# Patient Record
Sex: Female | Born: 1970
Health system: Southern US, Community
[De-identification: ages and names within clinical notes are randomized; demographics above are authoritative.]

## PROBLEM LIST (undated history)

## (undated) DIAGNOSIS — G629 Polyneuropathy, unspecified: Secondary | ICD-10-CM

## (undated) DIAGNOSIS — M5126 Other intervertebral disc displacement, lumbar region: Secondary | ICD-10-CM

## (undated) DIAGNOSIS — K219 Gastro-esophageal reflux disease without esophagitis: Secondary | ICD-10-CM

## (undated) DIAGNOSIS — O1490 Unspecified pre-eclampsia, unspecified trimester: Secondary | ICD-10-CM

## (undated) DIAGNOSIS — I493 Ventricular premature depolarization: Secondary | ICD-10-CM

## (undated) DIAGNOSIS — I10 Essential (primary) hypertension: Secondary | ICD-10-CM

## (undated) DIAGNOSIS — F419 Anxiety disorder, unspecified: Secondary | ICD-10-CM

## (undated) DIAGNOSIS — J302 Other seasonal allergic rhinitis: Secondary | ICD-10-CM

## (undated) DIAGNOSIS — K589 Irritable bowel syndrome without diarrhea: Secondary | ICD-10-CM

## (undated) DIAGNOSIS — G43909 Migraine, unspecified, not intractable, without status migrainosus: Secondary | ICD-10-CM

## (undated) HISTORY — DX: Migraine, unspecified, not intractable, without status migrainosus: G43.909

## (undated) HISTORY — DX: Gastro-esophageal reflux disease without esophagitis: K21.9

## (undated) HISTORY — DX: Unspecified pre-eclampsia, unspecified trimester: O14.90

## (undated) HISTORY — DX: Irritable bowel syndrome, unspecified: K58.9

## (undated) HISTORY — DX: Other seasonal allergic rhinitis: J30.2

## (undated) HISTORY — DX: Anxiety disorder, unspecified: F41.9

## (undated) HISTORY — PX: NO PAST SURGERIES: SHX2092

## (undated) HISTORY — DX: Essential (primary) hypertension: I10

## (undated) HISTORY — DX: Ventricular premature depolarization: I49.3

## (undated) HISTORY — DX: Other intervertebral disc displacement, lumbar region: M51.26

---

## 2009-11-23 ENCOUNTER — Emergency Department (HOSPITAL_BASED_OUTPATIENT_CLINIC_OR_DEPARTMENT_OTHER): Admission: EM | Admit: 2009-11-23 | Discharge: 2009-11-23 | Payer: Self-pay | Admitting: Emergency Medicine

## 2009-11-23 ENCOUNTER — Ambulatory Visit: Payer: Self-pay | Admitting: Occupational Medicine

## 2009-11-23 ENCOUNTER — Ambulatory Visit: Payer: Self-pay | Admitting: Radiology

## 2009-12-15 ENCOUNTER — Ambulatory Visit: Payer: Self-pay | Admitting: Family Medicine

## 2009-12-15 DIAGNOSIS — I10 Essential (primary) hypertension: Secondary | ICD-10-CM | POA: Insufficient documentation

## 2010-02-18 ENCOUNTER — Ambulatory Visit: Payer: Self-pay | Admitting: Family Medicine

## 2010-02-26 ENCOUNTER — Telehealth (INDEPENDENT_AMBULATORY_CARE_PROVIDER_SITE_OTHER): Payer: Self-pay | Admitting: *Deleted

## 2010-08-31 ENCOUNTER — Ambulatory Visit: Payer: Self-pay | Admitting: Family Medicine

## 2010-09-23 ENCOUNTER — Ambulatory Visit: Payer: Self-pay | Admitting: Family Medicine

## 2010-09-23 ENCOUNTER — Telehealth: Payer: Self-pay | Admitting: Family Medicine

## 2010-09-24 LAB — CONVERTED CEMR LAB
ALT: 10 units/L (ref 0–35)
Amylase: 57 units/L (ref 0–105)
BUN: 12 mg/dL (ref 6–23)
Basophils Absolute: 0 10*3/uL (ref 0.0–0.1)
Basophils Relative: 1 % (ref 0–1)
CO2: 26 meq/L (ref 19–32)
Creatinine, Ser: 0.78 mg/dL (ref 0.40–1.20)
Eosinophils Relative: 4 % (ref 0–5)
HCT: 40.2 % (ref 36.0–46.0)
Hemoglobin: 13.5 g/dL (ref 12.0–15.0)
Lymphocytes Relative: 36 % (ref 12–46)
MCHC: 33.6 g/dL (ref 30.0–36.0)
Monocytes Absolute: 0.4 10*3/uL (ref 0.1–1.0)
Monocytes Relative: 6 % (ref 3–12)
RDW: 12.7 % (ref 11.5–15.5)
Total Bilirubin: 0.4 mg/dL (ref 0.3–1.2)

## 2010-09-27 ENCOUNTER — Encounter: Admission: RE | Admit: 2010-09-27 | Discharge: 2010-09-27 | Payer: Self-pay | Admitting: Family Medicine

## 2010-09-29 ENCOUNTER — Telehealth: Payer: Self-pay | Admitting: Family Medicine

## 2010-10-05 ENCOUNTER — Encounter: Payer: Self-pay | Admitting: Family Medicine

## 2010-11-02 ENCOUNTER — Encounter: Payer: Self-pay | Admitting: Family Medicine

## 2010-11-08 ENCOUNTER — Ambulatory Visit: Payer: Self-pay | Admitting: Family Medicine

## 2010-11-08 LAB — CONVERTED CEMR LAB: Rapid Strep: NEGATIVE

## 2010-11-11 ENCOUNTER — Telehealth: Payer: Self-pay | Admitting: Family Medicine

## 2010-11-12 ENCOUNTER — Ambulatory Visit: Payer: Self-pay | Admitting: Family Medicine

## 2010-11-12 DIAGNOSIS — H8110 Benign paroxysmal vertigo, unspecified ear: Secondary | ICD-10-CM | POA: Insufficient documentation

## 2010-11-12 DIAGNOSIS — H8113 Benign paroxysmal vertigo, bilateral: Secondary | ICD-10-CM | POA: Insufficient documentation

## 2010-11-18 ENCOUNTER — Emergency Department (HOSPITAL_BASED_OUTPATIENT_CLINIC_OR_DEPARTMENT_OTHER)
Admission: EM | Admit: 2010-11-18 | Discharge: 2010-11-18 | Payer: Self-pay | Source: Home / Self Care | Admitting: Emergency Medicine

## 2010-11-30 ENCOUNTER — Ambulatory Visit: Payer: Self-pay | Admitting: Cardiovascular Disease

## 2010-11-30 DIAGNOSIS — R002 Palpitations: Secondary | ICD-10-CM | POA: Insufficient documentation

## 2010-12-02 ENCOUNTER — Ambulatory Visit: Payer: Self-pay | Admitting: Cardiovascular Disease

## 2010-12-09 ENCOUNTER — Ambulatory Visit (HOSPITAL_COMMUNITY)
Admission: RE | Admit: 2010-12-09 | Discharge: 2010-12-09 | Payer: Self-pay | Source: Home / Self Care | Attending: Cardiovascular Disease | Admitting: Cardiovascular Disease

## 2010-12-09 ENCOUNTER — Ambulatory Visit: Payer: Self-pay

## 2010-12-09 ENCOUNTER — Encounter: Payer: Self-pay | Admitting: Cardiovascular Disease

## 2010-12-15 ENCOUNTER — Telehealth: Payer: Self-pay | Admitting: Family Medicine

## 2010-12-22 ENCOUNTER — Encounter: Payer: Self-pay | Admitting: Cardiovascular Disease

## 2010-12-23 ENCOUNTER — Ambulatory Visit
Admission: RE | Admit: 2010-12-23 | Discharge: 2010-12-23 | Payer: Self-pay | Source: Home / Self Care | Attending: Cardiovascular Disease | Admitting: Cardiovascular Disease

## 2011-01-11 NOTE — Assessment & Plan Note (Signed)
Summary: sinusitis   Vital Signs:  Patient profile:   40 year old female Height:      63.5 inches Weight:      135 pounds BMI:     23.62 O2 Sat:      99 % on Room air Temp:     98.8 degrees F oral Pulse rate:   75 / minute BP sitting:   141 / 95  (left arm) Cuff size:   regular  Vitals Entered By: Payton Spark CMA (August 31, 2010 8:32 AM)  O2 Flow:  Room air CC: Sinusitis x 2 days. Fever & congestion.   Primary Care Provider:  Nani Gasser MD  CC:  Sinusitis x 2 days. Fever & congestion.Marland Kitchen  History of Present Illness: Melinda Parsons is a 40 year-old who presents with a one day history of right sided sinus pain, ear pain, headaches and a sore throat.  She reports temperatures around 99 degrees.  She also reports a mild cough and beginning to drain and blow her nose last night. She denies anyone being sick around her.  She also denies any history of allergy symptoms over the past few days, sinus drainage or a sore throat before yesterday.  She denies any dental pain, nausea, vomitting or diarrhea.  She says that she is using Claritin over the counter and that has been benefical.    Current Medications (verified): 1)  Nuvaring 0.12-0.015 Mg/24hr Ring (Etonogestrel-Ethinyl Estradiol) 2)  Claritin-D 24 Hour 10-240 Mg Xr24h-Tab (Loratadine-Pseudoephedrine) .... Take One Tablet  By Mouth Once A Day  Allergies (verified): No Known Drug Allergies  Past History:  Past Medical History: Reviewed history from 12/15/2009 and no changes required. Pre-eclampsia with her daughter.   Social History: Reviewed history from 02/18/2010 and no changes required. 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  Married to [place] with 1 daughter.   Never Smoked Alcohol use-yes- 1-2/month Drug use-no  Review of Systems      See HPI  Physical Exam  General:  alert, well-developed, and well-nourished.   Head:  L maxillary sinus TTP Eyes:  conjunctiva clear Ears:  EACs  patent; TMs translucent and gray with good cone of light and bony landmarks.  Nose:  No nasal discharge.  The turbinates have some ertheyma. Mouth:  good dentition.  Some fair pharyngeal erythema.  Some post nasal drip. Neck:  anterior cervical chain LA bilat Lungs:  Clear to auscultation bilaterally with no crackles, rales, rhonchi or wheezes.  No egophany. Heart:  normal rate, regular rhythm, no murmur, no gallop, and no rub.   Skin:  color normal.  color normal.     Impression & Recommendations:  Problem # 1:  SINUSITIS (ICD-473.9) Possible L maxillary sinusitis vs sinus pressure from viral URI. Will start Zithromax + Advil Cold and Sinus, though warned pt that abx may not be effective if this is all viral. Supportive care measures discussed. Call if not improving in 7 days. The following medications were removed from the medication list:    Claritin-d 24 Hour 10-240 Mg Xr24h-tab (Loratadine-pseudoephedrine) .Marland Kitchen... Take one tablet  by mouth once a day    Augmentin 875-125 Mg Tabs (Amoxicillin-pot clavulanate) .Marland Kitchen... 1 by mouth q12 hrs Her updated medication list for this problem includes:    Zithromax Z-pak 250 Mg Tabs (Azithromycin) .Marland Kitchen... Take as directed x 5 days  Problem # 2:  ELEVATED BLOOD PRESSURE WITHOUT DIAGNOSIS OF HYPERTENSION (ICD-796.2) The patient has been taking Claritin D  to help with  her nasal congestion again.  The patient should return for follow-up and have her blood pressure checked again to ensure that she is not hypertensive.  Today, her blood pressure was 141/95.  Complete Medication List: 1)  Nuvaring 0.12-0.015 Mg/24hr Ring (Etonogestrel-ethinyl estradiol) 2)  Zithromax Z-pak 250 Mg Tabs (Azithromycin) .... Take as directed x 5 days  Patient Instructions: 1)  Take 5 days of Zithromax for possible maxillary sinusitis. 2)  Use OTC Advil Cold and Sinus for symptomatic relief. 3)  Rest, clear fluids. 4)  Call if not starting to improve after 7  days. Prescriptions: ZITHROMAX Z-PAK 250 MG TABS (AZITHROMYCIN) take as directed x 5 days  #1 pack x 0   Entered and Authorized by:   Seymour Bars DO   Signed by:   Seymour Bars DO on 08/31/2010   Method used:   Electronically to        [facility] [phone]* (retail)       [address]       [place], [place]  [address]       Ph: [phone]       Fax: [phone]   RxID:   [id number]

## 2011-01-11 NOTE — Assessment & Plan Note (Signed)
Summary: POSS SINUS INFECTION/TJ   Vital Signs:  Patient Profile:   40 Years Old Female CC:      HA, sore teeth, left ear pressure, jaw pain X 1 day  Height:     63.5 inches Weight:      133 pounds O2 Sat:      99 % O2 treatment:    Room Air Temp:     99.5 degrees F oral Pulse rate:   106 / minute Pulse rhythm:   regular Resp:     14 per minute BP sitting:   135 / 98  (right arm)  Pt. in pain?   yes    Location:   head    Intensity:   4    Type:       aching  Vitals Entered By: Lajean Saver RN (February 18, 2010 8:29 AM)                   Updated Prior Medication List: NUVARING 0.12-0.015 MG/24HR RING (ETONOGESTREL-ETHINYL ESTRADIOL)  CLARITIN-D 24 HOUR 10-240 MG XR24H-TAB (LORATADINE-PSEUDOEPHEDRINE) Take one tablet  by mouth once a day  Current Allergies (reviewed today): No known allergies History of Present Illness Chief Complaint: HA, sore teeth, left ear pressure, jaw pain X 1 day  History of Present Illness: ONSET YESTERDAY WITH SINUS PRESSURE AND CONGESTION. POSSIBLE FEVER. TAKING CLARITIN D. NO COUGH SLIGHT SORE THROAT. HX OF SINUS PROBLEMS. UNDER STRESS AT WORK.   REVIEW OF SYSTEMS Constitutional Symptoms      Denies fever, chills, night sweats, weight loss, weight gain, and fatigue.  Eyes       Denies change in vision, eye pain, eye discharge, glasses, contact lenses, and eye surgery. Ear/Nose/Throat/Mouth       Complains of ear pain and sinus problems.      Denies hearing loss/aids, change in hearing, ear discharge, dizziness, frequent runny nose, frequent nose bleeds, sore throat, hoarseness, and tooth pain or bleeding.      Comments: pressure, jaw pain Respiratory       Denies dry cough, productive cough, wheezing, shortness of breath, asthma, bronchitis, and emphysema/COPD.  Cardiovascular       Denies murmurs, chest pain, and tires easily with exhertion.    Gastrointestinal       Denies stomach pain, nausea/vomiting, diarrhea, constipation, blood in  bowel movements, and indigestion. Genitourniary       Denies painful urination, kidney stones, and loss of urinary control. Neurological       Complains of headaches.      Denies paralysis, seizures, and fainting/blackouts. Musculoskeletal       Denies muscle pain, joint pain, joint stiffness, decreased range of motion, redness, swelling, muscle weakness, and gout.  Skin       Denies bruising, unusual mles/lumps or sores, and hair/skin or nail changes.  Psych       Denies mood changes, temper/anger issues, anxiety/stress, speech problems, depression, and sleep problems. Other Comments: tylenol taken for symptoms   Past History:  Past Medical History: Reviewed history from 12/15/2009 and no changes required. Pre-eclampsia with her daughter.   Past Surgical History: Reviewed history from 12/15/2009 and no changes required. None  Family History: Reviewed history from 12/15/2009 and no changes required. 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   Never Smoked Alcohol use-yes- 1-2/month Drug use-no Drug Use:  no Physical Exam  General appearance: well developed, well nourished, no acute distress Head: normocephalic, atraumatic Ears: normal, no lesions or deformities Nasal: CONGESTED WITH FACILA PRESSURE Oral/Pharynx: tongue normal, posterior pharynx without erythema or exudate Neck: neck supple,  trachea midline, no masses Chest/Lungs: no rales, wheezes, or rhonchi bilateral, breath sounds equal without effort Heart: regular rate and  rhythm, no murmur MSE: BP SLIGHTLY ELEVATED . NO HX OF HPT  Plan New Medications/Changes: TYLENOL WITH CODEINE #3 300-30 MG TABS (ACETAMINOPHEN-CODEINE) 1PO Q 6 HRS as needed PAIN  #12 x 0, 02/18/2010, Maisee Vollman DO AUGMENTIN 875-125 MG TABS (AMOXICILLIN-POT CLAVULANATE) 1 by mouth Q12 HRS  #14 x 0,  02/18/2010, Marvis Moeller DO  New Orders: Est. Patient Level III [[address]]   Diagnoses and expected course of recovery discussed and will return if not improved as expected or if the condition worsens. Patient and/or caregiver verbalized understanding.  Prescriptions: TYLENOL WITH CODEINE #3 300-30 MG TABS (ACETAMINOPHEN-CODEINE) 1PO Q 6 HRS as needed PAIN  #12 x 0   Entered and Authorized by:   Marvis Moeller DO   Signed by:   Marvis Moeller DO on 02/18/2010   Method used:   Print then Give to Patient   RxID:   [id number] AUGMENTIN 875-125 MG TABS (AMOXICILLIN-POT CLAVULANATE) 1 by mouth Q12 HRS  #14 x 0   Entered and Authorized by:   Marvis Moeller DO   Signed by:   Marvis Moeller DO on 02/18/2010   Method used:   Electronically to        [facility] [phone]* (retail)       [address]       [place], [place]  [address]       Ph: [phone]       Fax: [phone]   RxID:   [id number]   Patient Instructions: 1)  TYLENOL OR MOTRIN AS NEEDED. AVOID CAFFEINE AND MILK PRODUCTS. MUCINEX RECOMMENDED. TAKE TYLENOL #3 WHILE AT HOME IF NEEDED FOR PAIN. VICKS SINEX RECOMMENED AT BEDTIME FOR NO MORE THAN 4 NIGHTS.

## 2011-01-11 NOTE — Assessment & Plan Note (Signed)
Summary: vertigo   Vital Signs:  Patient profile:   40 year old female Height:      63.5 inches Weight:      136 pounds Temp:     98.3 degrees F oral BP sitting:   135 / 90  (right arm) Cuff size:   regular  Vitals Entered By: Avon Gully CMA, Duncan Dull) (November 12, 2010 3:22 PM) CC: ear pain and dizziness since wed   Primary Care Provider:  Nani Gasser MD  CC:  ear pain and dizziness since wed.  History of Present Illness: Started 2 days ago with dizziness. Felt like the room was spinning when she woke up. Felt a little better later in the morning. Next day had it all day on and off. Can happen when moves even very slowly or sitting stil.  No ear pain. No HA.  Mild ST.  No othe rURI sxs.  No fever. Tried the meclizine but not helping. the vertigo is horizontal.  Current Medications (verified): 1)  Nuvaring 0.12-0.015 Mg/24hr Ring (Etonogestrel-Ethinyl Estradiol) 2)  Claritin 10 Mg Tabs (Loratadine) 3)  Hydrochlorothiazide 12.5 Mg Caps (Hydrochlorothiazide) .... Take 1 Tablet By Mouth Once A Day  Allergies (verified): No Known Drug Allergies  Comments:  Nurse/Medical Assistant: The patient's medications and allergies were reviewed with the patient and were updated in the Medication and Allergy Lists. Avon Gully CMA, Duncan Dull) (November 12, 2010 3:24 PM)  Physical Exam  General:  Well-developed,well-nourished,in no acute distress; alert,appropriate and cooperative throughout examination Head:  Normocephalic and atraumatic without obvious abnormalities. No apparent alopecia or balding. Eyes:  No corneal or conjunctival inflammation noted. EOMI. Perrla.  Ears:  External ear exam shows no significant lesions or deformities.  Otoscopic examination reveals clear canals, tympanic membranes are intact bilaterally without bulging, retraction, inflammation or discharge. Hearing is grossly normal bilaterally. Nose:  External nasal examination shows no deformity or  inflammation. Mouth:  Oral mucosa and oropharynx without lesions or exudates.  Teeth in good repair. Lungs:  Normal respiratory effort, chest expands symmetrically. Lungs are clear to auscultation, no crackles or wheezes. Heart:  Normal rate and regular rhythm. S1 and S2 normal without gallop, murmur, click, rub or other extra sounds. Pulses:  radial pulses 2+ bilaterally Neurologic:  alert & oriented X3, cranial nerves II-XII intact, gait normal, DTRs symmetrical and normal, and heel-to-shin normal.  rapid alternating hand movements normal. Skin:  no rashes.   Cervical Nodes:  No lymphadenopathy noted Psych:  Cognition and judgment appear intact. Alert and cooperative with normal attention span and concentration. No apparent delusions, illusions, hallucinations   Impression & Recommendations:  Problem # 1:  BENIGN POSITIONAL VERTIGO (ICD-386.11)  Her updated medication list for this problem includes:    Claritin 10 Mg Tabs (Loratadine)  Demonstrated maneuvers to self-treat vertigo. also given a handout. Patient to call to be seen if no improvement in 10-14 days, sooner if worse.   Complete Medication List: 1)  Nuvaring 0.12-0.015 Mg/24hr Ring (Etonogestrel-ethinyl estradiol) 2)  Claritin 10 Mg Tabs (Loratadine) 3)  Hydrochlorothiazide 12.5 Mg Caps (Hydrochlorothiazide) .... Take 1 tablet by mouth once a day  Patient Instructions: 1)  Call if not better in one week.  2)  See the handout on exercises.  3)  Can use the non-drowsy dramamine as needed

## 2011-01-11 NOTE — Progress Notes (Signed)
Summary: dizziness  Phone Note Call from Patient Call back at Home Phone [phone]   Caller: Patient Call For: Nani Gasser MD Summary of Call: Pt calls and states has been having dizziness the last few days since being seen to where she has alomost passed out and wanted to know if there was anything oTC that could help with that. States when gets out of bed room seems like it is spinning. Ears feel full and having some drainage and questions if allergy related Initial call taken by: Kathlene November LPN,  November 11, 2010 9:49 AM  Follow-up for Phone Call        try otc meclizine(non drowsy dramamine). If not getting better need OV>  Follow-up by: Nani Gasser MD,  November 11, 2010 10:39 AM  Additional Follow-up for Phone Call Additional follow up Details #1::        Pt notified of above instructions. Additional Follow-up by: Kathlene November LPN,  November 11, 2010 10:43 AM

## 2011-01-11 NOTE — Consult Note (Signed)
Summary: [facility]   Imported By: Lanelle Bal 10/20/2010 11:40:24  _____________________________________________________________________  External Attachment:    Type:   Image     Comment:   External Document

## 2011-01-11 NOTE — Assessment & Plan Note (Signed)
Summary: NOV: elevated BP, sinusitis   Vital Signs:  Patient profile:   40 year old female Height:      64.5 inches Weight:      130 pounds BMI:     22.05 Temp:     99.0 degrees F oral Pulse rate:   96 / minute BP sitting:   131 / 86  (left arm) Cuff size:   regular  Vitals Entered By: Kathlene November (December 15, 2009 10:42 AM) CC: NP- get established. Followup on BP- high when went to UC. Has had head and chest congestion, cough, spitting up green with blood tinged sputum, ears itch some H/A for 1 month now, Headache Is Patient Diabetic? No   Primary Care Provider:  Nani Gasser MD  CC:  NP- get established. Followup on BP- high when went to UC. Has had head and chest congestion, cough, spitting up green with blood tinged sputum, ears itch some H/A for 1 month now, and Headache.  History of Present Illness: NP- get established. Followup on BP- high when went to UC. Bp was 149/100 and went to ED adn had a Head CT.  Was suppposed to fu with neurology.  Has had head and chest congestion, cough, spitting up green with blood tinged sputum, ears itch some H/A for 1 month now. Got alittle better for about 4-5 days and then got worse.   No fever.  No GI sxs.  Taking Claritin D and helping some.   Habits & Providers  Alcohol-Tobacco-Diet     Alcohol drinks/day: <1     Tobacco Status: never  Exercise-Depression-Behavior     Does Patient Exercise: yes     Type of exercise: cardio and strength     Exercise (avg: min/session): 30-60     Times/week: 3     STD Risk: never     Drug Use: never     Seat Belt Use: always  Current Medications (verified): 1)  Nuvaring 0.12-0.015 Mg/24hr Ring (Etonogestrel-Ethinyl Estradiol) 2)  Claritin-D 24 Hour 10-240 Mg Xr24h-Tab (Loratadine-Pseudoephedrine) .... Take One Tablet  By Mouth Once A Day  Allergies (verified): No Known Drug Allergies  Comments:  Nurse/Medical Assistant: The patient's medications and allergies were reviewed with the  patient and were updated in the Medication and Allergy Lists. Kathlene November (December 15, 2009 10:45 AM)  Past History:  Past Medical History: Pre-eclampsia with her daughter.   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  Smoking Status:  never Does Patient Exercise:  yes STD Risk:  never Drug Use:  never Seat Belt Use:  always  Review of Systems       No fever/sweats/weakness, unexplained weight loss/gain.  + vison changes.  No difficulty hearing/ringing in ears, + hay fever/allergies.  No chest pain/discomfort, palpitations.  No Br lump/nipple discharge.  No cough/wheeze.  No blood in BM, nausea/vomiting/diarrhea.  No nighttime urination, leaking urine, unusual vaginal bleeding, discharge (penis or vagina).  No muscle/joint pain. No rash, change in mole.  No HA, memory loss.  No anxiety, sleep d/o, depression.  No easy bruising/bleeding, unexplained lump   Physical Exam  General:  Well-developed,well-nourished,in no acute distress; alert,appropriate and cooperative throughout examination Head:  Normocephalic and atraumatic without obvious abnormalities. No apparent alopecia or balding. Eyes:  No corneal or conjunctival inflammation noted. EOMI. Perrla. Ears:  External ear exam shows no significant lesions or deformities.  Otoscopic examination reveals clear canals, tympanic membranes are intact bilaterally without bulging, retraction, inflammation or discharge. Hearing is grossly normal bilaterally. Nose:  External nasal examination shows no deformity or inflammation. Nasal mucosa are pink and moist without lesions or exudates. Mouth:  Oral mucosa and oropharynx without lesions or exudates.  Teeth in good repair. Neck:  No deformities, masses, or tenderness noted. NO TM.  Lungs:  Normal respiratory effort,  chest expands symmetrically. Lungs are clear to auscultation, no crackles or wheezes. Heart:  Normal rate and regular rhythm. S1 and S2 normal without gallop, murmur, click, rub or other extra sounds. Skin:  no rashes.   Cervical Nodes:  No lymphadenopathy noted Psych:  Cognition and judgment appear intact. Alert and cooperative with normal attention span and concentration. No apparent delusions, illusions, hallucinations   Impression & Recommendations:  Problem # 1:  SINUSITIS (ICD-473.9)  Stop teh Claritin D as this is the likely culprit for raising her BP. Fu once over her sinusitis and recheck BP later this months.  Her updated medication list for this problem includes:    Claritin-d 24 Hour 10-240 Mg Xr24h-tab (Loratadine-pseudoephedrine) .Marland Kitchen... Take one tablet  by mouth once a day    Amoxicillin 400 Mg Chw Tab (Amoxicillin) .Marland Kitchen... Take one (1) tablet by mouth three (3) times a day x 10 days  Take antibiotics for full duration. Discussed treatment options including indications for coronal CT scan of sinuses and ENT referral.   Problem # 2:  ELEVATED BLOOD PRESSURE WITHOUT DIAGNOSIS OF HYPERTENSION (ICD-796.2) Stop the Claritin D as this is the likely culprit for raising her BP. Fu once over her sinusitis and recheck BP later this months. Will rule out thyroid d/o and screen cholesterol.  Orders: T-TSH [id number]) T-Lipid Profile [id number])  Complete Medication List: 1)  Nuvaring 0.12-0.015 Mg/24hr Ring (Etonogestrel-ethinyl estradiol) 2)  Claritin-d 24 Hour 10-240 Mg Xr24h-tab (Loratadine-pseudoephedrine) .... Take one tablet  by mouth once a day 3)  Amoxicillin 400 Mg Chw Tab (Amoxicillin) .... Take one (1) tablet by mouth three (3) times a day x 10 days Prescriptions: AMOXICILLIN 400 MG CHW TAB (AMOXICILLIN) Take one (1) tablet by mouth three (3) times a day X 10 days  #30 x 0   Entered and Authorized by:   Nani Gasser MD   Signed by:   Nani Gasser MD on  12/15/2009   Method used:   Electronically to        [facility] [phone]* (retail)       [address]       [place], [place]  [address]       Ph: [phone]       Fax: [phone]   RxID:   [id number]

## 2011-01-11 NOTE — Progress Notes (Signed)
Summary: Continues with abd. pain  Phone Note Call from Patient Call back at Home Phone [phone]   Caller: Patient Call For: Nani Gasser MD Summary of Call: Been on Prilosec since las twednesday- burning in stomach still on tiwce a day med. Pain is all day long on and off. Has alot of gas as well feels unpleasant. Initial call taken by: Kathlene November LPN,  September 29, 2010 1:43 PM  Follow-up for Phone Call        LIkely needs endoscopy. Will erfer to GI for further eval. Welcome to come pick up dexilant samples if would like to try theim instead of hte prilosec.  Follow-up by: Nani Gasser MD,  September 29, 2010 2:38 PM  Additional Follow-up for Phone Call Additional follow up Details #1::        Pt notifeid. Samples given and appt made. KJ LPN Additional Follow-up by: Kathlene November LPN,  September 29, 2010 3:15 PM

## 2011-01-11 NOTE — Letter (Signed)
Summary: [facility]   Imported By: Maryln Gottron 11/15/2010 14:52:06  _____________________________________________________________________  External Attachment:    Type:   Image     Comment:   External Document

## 2011-01-11 NOTE — Assessment & Plan Note (Signed)
Summary: HTN, pharyngitis   Vital Signs:  Patient profile:   40 year old female Height:      63.5 inches Weight:      134 pounds Pulse rate:   73 / minute BP sitting:   134 / 90  (right arm) Cuff size:   regular  Vitals Entered By: Avon Gully CMA, Duncan Dull) (November 08, 2010 8:53 AM) CC: f/u BP, pt forgot to take her pill this am, Hypertension Management   Primary Care Provider:  Nani Gasser MD  CC:  f/u BP, pt forgot to take her pill this am, and Hypertension Management.  History of Present Illness: f/u BP, pt forgot to take her pill this am. Tolerating well with no SE.  Had a stressful AM so forgot to take her pill. Her car wouldn't start.   ST for 2 days with some mild nasal congestion and drainage. No fever, cough or ear sxs.  Says went to [facility] over the weekend and since this throat has been irritated. Also her daughter is a strep carrier.  Not taking any cold or cough meds.    Hypertension History:      She denies headache, chest pain, palpitations, dyspnea with exertion, orthopnea, PND, peripheral edema, visual symptoms, neurologic problems, syncope, and side effects from treatment.  She notes no problems with any antihypertensive medication side effects.  BP was 126/80 at her GI appt. Marland Kitchen        Positive major cardiovascular risk factors include hypertension.  Negative major cardiovascular risk factors include female age less than 15 years old and non-tobacco-user status.     Current Medications (verified): 1)  Nuvaring 0.12-0.015 Mg/24hr Ring (Etonogestrel-Ethinyl Estradiol) 2)  Claritin 10 Mg Tabs (Loratadine) 3)  Hydrochlorothiazide 12.5 Mg Caps (Hydrochlorothiazide) .... Take 1 Tablet By Mouth Once A Day  Allergies (verified): No Known Drug Allergies  Comments:  Nurse/Medical Assistant: The patient's medications and allergies were reviewed with the patient and were updated in the Medication and Allergy Lists. Avon Gully CMA, Duncan Dull) (November 08, 2010 8:53 AM)  Physical Exam  General:  Well-developed,well-nourished,in no acute distress; alert,appropriate and cooperative throughout examination Head:  Normocephalic and atraumatic without obvious abnormalities. No apparent alopecia or balding. Eyes:  No corneal or conjunctival inflammation noted. EOMI.  Wears glasses Mouth:  Oral mucosa and oropharynx without lesions or exudates.  Teeth in good repair. Lungs:  Normal respiratory effort, chest expands symmetrically. Lungs are clear to auscultation, no crackles or wheezes. Heart:  Normal rate and regular rhythm. S1 and S2 normal without gallop, murmur, click, rub or other extra sounds. Skin:  no rashes.   Cervical Nodes:  Enlarged anterior cerv LN.  Psych:  Cognition and judgment appear intact. Alert and cooperative with normal attention span and concentration. No apparent delusions, illusions, hallucinations   Impression & Recommendations:  Problem # 1:  HYPERTENSION, MILD (ICD-401.1) Improved but didn't take meds this AM. Sounds like a graet pressure at GI office.  Check BMP at next OV.  Continue current regimena and f/u in 3 months.  Her updated medication list for this problem includes:    Hydrochlorothiazide 12.5 Mg Caps (Hydrochlorothiazide) .Marland Kitchen... Take 1 tablet by mouth once a day  Problem # 2:  PHARYNGITIS (ICD-462)  Likely viral vs allergy. Trial of antihistamine and salt water gargles.    Orders: Rapid Strep (20254)  Complete Medication List: 1)  Nuvaring 0.12-0.015 Mg/24hr Ring (Etonogestrel-ethinyl estradiol) 2)  Claritin 10 Mg Tabs (Loratadine) 3)  Hydrochlorothiazide 12.5 Mg Caps (  Hydrochlorothiazide) .... Take 1 tablet by mouth once a day  Hypertension Assessment/Plan:      The patient's hypertensive risk group is category A: No risk factors and no target organ damage.  Today's blood pressure is 134/90.  Her blood pressure goal is < 140/90.  Patient Instructions: 1)  Please schedule a follow-up appointment  in 3 months for blood pressure.  2)  Salt water gargles and maybe an antihistamine like claritin or zyrtec for your throat.  Prescriptions: HYDROCHLOROTHIAZIDE 12.5 MG CAPS (HYDROCHLOROTHIAZIDE) Take 1 tablet by mouth once a day  #30 x 2   Entered and Authorized by:   Nani Gasser MD   Signed by:   Nani Gasser MD on 11/08/2010   Method used:   Electronically to        [facility] [phone]* (retail)       [address]       [place], [place]  [address]       Ph: [phone]       Fax: [phone]   RxID:   [id number]    Orders Added: 1)  Est. Patient Level IV [71062] 2)  Rapid Strep [69485]    Laboratory Results  Date/Time Received: 11/08/10 Date/Time Reported: 11/08/10  Other Tests  Rapid Strep: negative

## 2011-01-11 NOTE — Progress Notes (Signed)
Summary: Ultra Sound status? Call patient back in the A.M.  Phone Note Call from Patient   Caller: Patient Summary of Call: Pt. called and states that she was seen today by Dr.Bowen and wants to know when her ultra sound will be set up fpr so she can plan around this? Please call her at Home [phone] and cell is [phone] and let her know the status on this. Thanks.Michaelle Copas  September 23, 2010 4:07 PM  Initial call taken by: Michaelle Copas,  September 23, 2010 4:07 PM  Follow-up for Phone Call        this has already been set up and i talked with the pt Follow-up by: Avon Gully CMA, Duncan Dull),  September 24, 2010 4:41 PM

## 2011-01-11 NOTE — Assessment & Plan Note (Signed)
Summary: Epigastric pain, HTN   Vital Signs:  Patient profile:   40 year old female Height:      63.5 inches Weight:      135 pounds BMI:     23.62 Pulse rate:   86 / minute BP sitting:   142 / 94  (right arm) Cuff size:   regular  Vitals Entered By: Avon Gully CMA, Duncan Dull) (September 23, 2010 9:47 AM) CC: abdominal discomfort since monday, woke mon in teatrs, been using beano and prilosec and sx seem better but pt doesnt feel "normal"   Primary Care Provider:  Nani Gasser MD  CC:  abdominal discomfort since monday, woke mon in teatrs, and been using beano and prilosec and sx seem better but pt doesnt feel "normal".  History of Present Illness: abdominal discomfort since monday night, woke mon in teatrs, been using beano and prilosec and sx seem better but pt doesnt feel "normal". Pain was in the epigastric area. Didn't vomit and no diarrhea and felt extremely nauseated. Would eat a little and it was painful. Then Tuesdays nght thinks over ate adn then got sever pain, vomited and it hurt into her back and felt like a band around her upper abdomen. Was up all night because of pain. Almost went to ED.  Worse when you walk. Temp 99 on Tuesdays. No more vomiting. Took the prilosec the next day and felt a littel better. Mom had GB removed.   Habits & Providers  Exercise-Depression-Behavior     Does Patient Exercise: yes  Current Medications (verified): 1)  Nuvaring 0.12-0.015 Mg/24hr Ring (Etonogestrel-Ethinyl Estradiol) 2)  Claritin 10 Mg Tabs (Loratadine)  Allergies (verified): No Known Drug Allergies  Comments:  Nurse/Medical Assistant: The patient's medications and allergies were reviewed with the patient and were updated in the Medication and Allergy Lists. 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  Married to [place] with 1 daughter.   Never Smoked Alcohol use-yes- 1-2/month Drug use-no Regular exercise-yes  Physical Exam  General:  Well-developed,well-nourished,in no acute distress; alert,appropriate and cooperative throughout examination Head:  Normocephalic and atraumatic without obvious abnormalities. No apparent alopecia or balding. Lungs:  Normal respiratory effort, chest expands symmetrically. Lungs are clear to auscultation, no crackles or wheezes. Heart:  Normal rate and regular rhythm. S1 and S2 normal without gallop, murmur, click, rub or other extra sounds. Abdomen:  soft, normal bowel sounds, no distention, no hepatomegaly, and no splenomegaly.  Diffuse tenderness.  Pulses:  RAdial 2+   Skin:  no rashes.   Cervical Nodes:  No lymphadenopathy noted Psych:  Cognition and judgment appear intact. Alert and cooperative with normal attention span and concentration. No apparent delusions, illusions, hallucinations   Impression & Recommendations:  Problem # 1:  EPIGASTRIC PAIN (ICD-789.06) Assessment New Consider gastritis vs GB disases. Wil start a PPI. Increase prilosec to two times a day and reviewed dietary change. Will get labs to rule out any liver or pancreas problems on infection. Will schedule for GB US for further evalation.   Orders: T-CBC w/Diff [id number]) T-Comprehensive Metabolic Panel [id number]) T-Amylase [id number]) T-Lipase ([id number]) T-Ultrasound Abdominal, Complete ([id number])  Discussed symptom control with the patient.   Problem # 2:  HYPERTENSION, MILD (ICD-401.1) Assessment: New Discussed starting diuretic since really she is a healthy weight, eats well and works on  regularly.  Discussed potential SE.  F/U in 6 weeks.  Her updated medication list for this problem includes:    Hydrochlorothiazide 12.5 Mg Caps (Hydrochlorothiazide) .Marland Kitchen... Take 1 tablet by mouth once a day  BP  today: 142/94 Prior BP: 141/95 (08/31/2010)  Complete Medication List: 1)  Nuvaring 0.12-0.015 Mg/24hr Ring (Etonogestrel-ethinyl estradiol) 2)  Claritin 10 Mg Tabs (Loratadine) 3)  Hydrochlorothiazide 12.5 Mg Caps (Hydrochlorothiazide) .... Take 1 tablet by mouth once a day  Patient Instructions: 1)  We will call you with your lab results.  2)  Will will call you with the [place] appointment 3)  Increase your prilosec to two times a day  4)  Avoid greasy, spicey, acidic foods. avoid caffeine and carbonated beverages.  5)  Please schedule a follow-up appointment in 6 weeks for blood pressure.  Prescriptions: HYDROCHLOROTHIAZIDE 12.5 MG CAPS (HYDROCHLOROTHIAZIDE) Take 1 tablet by mouth once a day  #30 x 1   Entered and Authorized by:   Nani Gasser MD   Signed by:   Nani Gasser MD on 09/23/2010   Method used:   Electronically to        [facility] [phone]* (retail)       [address]       [place], [place]  [address]       Ph: [phone]       Fax: [phone]   RxID:   [id number]

## 2011-01-11 NOTE — Progress Notes (Signed)
Summary: Sinus infection  Phone Note Call from Patient   Caller: Patient Summary of Call: Pt seen at UC last week for sinus infection finished ABX yesterday. Pt still not feeling better. Does she need to OV next week or virus needs time?  Please advise. Initial call taken by: Payton Spark CMA,  February 26, 2010 10:43 AM  Follow-up for Phone Call        Give it until Monday, If nto better then make appt to come in.  Follow-up by: Nani Gasser MD,  February 26, 2010 12:40 PM  Additional Follow-up for Phone Call Additional follow up Details #1::        Pt aware Additional Follow-up by: Payton Spark CMA,  February 26, 2010 12:48 PM

## 2011-01-13 NOTE — Assessment & Plan Note (Signed)
Summary: blood pressure problem/mt   Visit Type:  Initial Consult Primary Provider:  Nani Gasser MD  CC:  Palpitations and SOB.  History of Present Illness: 40 yo female with history of HTN who was seen in the [facility] for palpitations, SOB and pain in her left arm. She was sitting in class and had the sudden onset of symptoms. She has had one prior episode of similar symptoms. She had someone in her class check her blood pressure and it was 150s systolically. Workup in ED with normal EKG and normal electrolytes, negative cardiac markers. She had an endoscopy last week and it was fine with no esophageal erosions or ulcers. She has occasional palpitations, once per week. This feels like her heart is skipping beats. No dizziness, near syncope, syncope.   Current Medications (verified): 1)  Nuvaring 0.12-0.015 Mg/24hr Ring (Etonogestrel-Ethinyl Estradiol) .... As Directed 2)  Claritin 10 Mg Tabs (Loratadine) .Marland Kitchen.. 1 Tab Once Daily As Needed 3)  Hydrochlorothiazide 12.5 Mg Caps (Hydrochlorothiazide) .... Take 1 Tablet By Mouth Once A Day 4)  Nexium 40 Mg Cpdr (Esomeprazole Magnesium) .Marland Kitchen.. 1 By Mouth Daily  Allergies (verified): No Known Drug Allergies  Past History:  Past Medical History: Pre-eclampsia with her daughter.  HTN ?GERD  Past Surgical History: Reviewed history from 12/15/2009 and no changes required. None  Family History: Reviewed history from 12/15/2009 and no changes required. Mom with HTN, cholesterol GM with HTN Grandparent with alcoholism  Mother-alive, HTN, hyperliipidemia Father-alive, ? health  No CAD  Social History: Reviewed history from 09/23/2010 and no changes required. 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   Never Smoked Alcohol use-yes- 1-2/month Drug use-no Regular exercise-yes  Review of Systems       The patient complains of chest pain, palpitations, and shortness of  breath.  The patient denies fatigue, malaise, fever, weight gain/loss, vision loss, decreased hearing, hoarseness, prolonged cough, wheezing, sleep apnea, coughing up blood, abdominal pain, blood in stool, nausea, vomiting, diarrhea, heartburn, incontinence, blood in urine, muscle weakness, joint pain, leg swelling, rash, skin lesions, headache, fainting, dizziness, depression, anxiety, enlarged lymph nodes, easy bruising or bleeding, and environmental allergies.    Vital Signs:  Patient profile:   40 year old female Height:      63.5 inches Weight:      136 pounds BMI:     23.80 Pulse rate:   88 / minute Resp:     16 per minute BP sitting:   132 / 98  (left arm)  Vitals Entered By: Marrion Coy, CNA (November 30, 2010 3:55 PM)  Physical Exam  General:  General: Well developed, well nourished, NAD HEENT: OP clear, mucus membranes moist SKIN: warm, dry Neuro: No focal deficits Musculoskeletal: Muscle strength 5/5 all ext Psychiatric: Mood and affect normal Neck: No JVD, no carotid bruits, no thyromegaly, no lymphadenopathy. Lungs:Clear bilaterally, no wheezes, rhonci, crackles CV: RRR no murmurs, gallops rubs Abdomen: soft, NT, ND, BS present Extremities: No edema, pulses 2+.    EKG  Procedure date:  11/18/2010  Findings:      NSR, no ischemic changes  Impression & Recommendations:  Problem # 1:  PALPITATIONS (ICD-785.1) Most likely PVCs. Will arrange echo and 48 hour Holter monitor.  Follow up two weeks.   Orders: Echocardiogram (Echo) Holter (Holter)  Patient Instructions: 1)  Your physician has requested that you have an echocardiogram.  Echocardiography is a painless test that uses sound  waves to create images of your heart. It provides your doctor with information about the size and shape of your heart and how well your heart's chambers and valves are working.  This procedure takes approximately one hour. There are no restrictions for this procedure. 2)  Your  physician has recommended that you wear a holter monitor.  Holter monitors are medical devices that record the heart's electrical activity. Doctors most often use these monitors to diagnose arrhythmias. Arrhythmias are problems with the speed or rhythm of the heartbeat. The monitor is a small, portable device. You can wear one while you do your normal daily activities. This is usually used to diagnose what is causing palpitations/syncope (passing out). 3)  Follow up in 2-3 weeks

## 2011-01-13 NOTE — Progress Notes (Signed)
Summary: Vertigo  Phone Note Call from Patient Call back at [phone]   Caller: Patient Call For: Nani Gasser MD Summary of Call: attack of vertigo last night- felt like falling backwards then threw up. Pt states gets these randomly and takes Benedryl but not much helps. Uses [facility] in [place]. Has never tried Meclizine or anything prescription for these attacks. Initial call taken by: Kathlene November LPN,  December 15, 2010 10:56 AM  Follow-up for Phone Call        Haver her try the meclizine. It is OTC but the pharmacist can help her find it. If she is not getting better in next couple of days to her to come in.  Follow-up by: Nani Gasser MD,  December 15, 2010 12:30 PM  Additional Follow-up for Phone Call Additional follow up Details #1::        Pt notifeid of MD instructions. KJ LPN Additional Follow-up by: Kathlene November LPN,  December 15, 2010 12:47 PM

## 2011-01-13 NOTE — Miscellaneous (Signed)
  Clinical Lists Changes  Observations: Added new observation of ECHOINTERP:  Study Conclusions     Left ventricle: The cavity size was normal. Systolic function was     normal. The estimated ejection fraction was in the range of 55% to     60%. Wall motion was normal; there were no regional wall motion     abnormalities.    Transthoracic echocardiography. M-mode, complete     2D, spectral Doppler, and color Doppler. Height: Height: 160cm.     Height: 63in. Weight: Weight: 61.7kg. Weight: 135.7lb. Body mass     index: BMI: 24.1kg/m 2. Body surface area: BSA: 1.1m 2. Blood     pressure: 144/102. Patient status: Outpatient. Location: [facility]     Site 3 (12/09/2010 10:10)      Echocardiogram  Procedure date:  12/09/2010  Findings:       Study Conclusions     Left ventricle: The cavity size was normal. Systolic function was     normal. The estimated ejection fraction was in the range of 55% to     60%. Wall motion was normal; there were no regional wall motion     abnormalities.    Transthoracic echocardiography. M-mode, complete     2D, spectral Doppler, and color Doppler. Height: Height: 160cm.     Height: 63in. Weight: Weight: 61.7kg. Weight: 135.7lb. Body mass     index: BMI: 24.1kg/m 2. Body surface area: BSA: 1.4m 2. Blood     pressure: 144/102. Patient status: Outpatient. Location: [facility]     Site 3

## 2011-01-13 NOTE — Assessment & Plan Note (Signed)
Summary: per check out/sf   Visit Type:  Follow-up Primary Provider:  Nani Gasser MD  CC:  palpitations.  History of Present Illness: 40 yo female with history of HTN who is here for follow up. She was seen as a new pt several weeks ago. She was seen in the [facility] for palpitations, SOB and pain in her left arm. She was sitting in class and had the sudden onset of symptoms. She has had one prior episode of similar symptoms. She had someone in her class check her blood pressure and it was 150s systolically. Workup in ED with normal EKG and normal electrolytes, negative cardiac markers. She had an endoscopy  and it was fine with no esophageal erosions or ulcers. She has occasional palpitations, once per week. This feels like her heart is skipping beats. No dizziness, near syncope, syncope.   I ordered an echo and a Holter monitor. Her holter showed NSR with PACs and 2 short 3 beat runs of SVT. Echo normal. Her symptoms are improved. Occasional palpitations. She does drink 2-3 cups of coffee per day.   Current Medications (verified): 1)  Nuvaring 0.12-0.015 Mg/24hr Ring (Etonogestrel-Ethinyl Estradiol) .... As Directed 2)  Claritin 10 Mg Tabs (Loratadine) .Marland Kitchen.. 1 Tab Once Daily As Needed 3)  Hydrochlorothiazide 12.5 Mg Caps (Hydrochlorothiazide) .... Take 1 Tablet By Mouth Once A Day 4)  Nexium 40 Mg Cpdr (Esomeprazole Magnesium) .Marland Kitchen.. 1 By Mouth Daily  Allergies (verified): No Known Drug Allergies  Past History:  Past Medical History: Pre-eclampsia with her daughter.  HTN ?GERD PACs with short runs of SVT  Social History: Reviewed history from 11/30/2010 and no changes required. 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   Never Smoked Alcohol use-yes- 1-2/month Drug use-no Regular exercise-yes  Review of Systems       The patient complains of palpitations.  The patient denies fatigue, malaise, fever, weight  gain/loss, vision loss, decreased hearing, hoarseness, chest pain, shortness of breath, prolonged cough, wheezing, sleep apnea, coughing up blood, abdominal pain, blood in stool, nausea, vomiting, diarrhea, heartburn, incontinence, blood in urine, muscle weakness, joint pain, leg swelling, rash, skin lesions, headache, fainting, dizziness, depression, anxiety, enlarged lymph nodes, easy bruising or bleeding, and environmental allergies.    Vital Signs:  Patient profile:   40 year old female Height:      63.5 inches Weight:      135 pounds BMI:     23.62 Pulse rate:   81 / minute Resp:     16 per minute BP sitting:   134 / 92  (right arm)  Vitals Entered By: Marrion Coy, CNA (December 23, 2010 8:46 AM)  Physical Exam  General:  General: Well developed, well nourished, NAD Musculoskeletal: Muscle strength 5/5 all ext Psychiatric: Mood and affect normal Neck: No JVD, no thyromegaly, no lymphadenopathy. Lungs:Clear bilaterally, no wheezes, rhonci, crackles CV: RRR no murmurs, gallops rubs Abdomen: soft, NT, ND, BS present Extremities: No edema, pulses 2+.    Echocardiogram  Procedure date:  12/09/2010  Findings:      Left ventricle: The cavity size was normal. Systolic function was     normal. The estimated ejection fraction was in the range of 55% to     60%. Wall motion was normal; there were no regional wall motion     abnormalities.    Holter Monitor  Procedure date:  12/02/2010  Findings:  Sinus rhythm. Occasional PVCs. 2 short 3 beat runs of SVT.   Impression & Recommendations:  Problem # 1:  PALPITATIONS (ICD-785.1) Most likely related to PACs. These are not lifestyle limiting. She does not wish to start any medications. Echo normal. Will call us if she has worseing of symptoms or change in clinical status. I have asked her to avoid caffeine or any stimulants.   Problem # 2:  HYPERTENSION, MILD (ICD-401.1) Increase HCTZ to 25 mg by mouth Qdaily. Follow up in  primary care.   Her updated medication list for this problem includes:    Hydrochlorothiazide 25 Mg Tabs (Hydrochlorothiazide) .Marland Kitchen... Take one tablet by mouth daily.  Patient Instructions: 1)  Your physician recommends that you schedule a follow-up appointment as needed. 2)  Your physician has recommended you make the following change in your medication: INCREASE your HCTZ to 25mg  daily. Prescriptions: HYDROCHLOROTHIAZIDE 25 MG TABS (HYDROCHLOROTHIAZIDE) Take one tablet by mouth daily.  #30 x 6   Entered by:   Whitney Maeola Sarah RN   Authorized by:   Verne Carrow, MD   Signed by:   Ellender Hose RN on 12/23/2010   Method used:   Electronically to        [facility] [phone]* (retail)       [address] [place] [address] [place], [place]  [address]       Ph: [phone]       Fax: [phone]   RxID:   [id number]

## 2011-02-22 LAB — COMPREHENSIVE METABOLIC PANEL
ALT: 12 U/L (ref 0–35)
AST: 26 U/L (ref 0–37)
Albumin: 4.8 g/dL (ref 3.5–5.2)
Alkaline Phosphatase: 44 U/L (ref 39–117)
BUN: 14 mg/dL (ref 6–23)
Chloride: 102 mEq/L (ref 96–112)
GFR calc Af Amer: >60 mL/min (ref >60)
Potassium: 4 mEq/L (ref 3.5–5.1)
Sodium: 144 mEq/L (ref 135–145)
Total Bilirubin: 0.6 mg/dL (ref 0.3–1.2)
Total Protein: 8.6 g/dL — ABNORMAL HIGH (ref 6.0–8.3)

## 2011-02-22 LAB — CBC
MCV: 86 fL (ref 78.0–100.0)
Platelets: 284 10*3/uL (ref 150–400)
RBC: 4.8 MIL/uL (ref 3.87–5.11)
RDW: 12.3 % (ref 11.5–15.5)
WBC: 7.8 10*3/uL (ref 4.0–10.5)

## 2011-02-22 LAB — POCT CARDIAC MARKERS
CKMB, poc: <1 ng/mL — ABNORMAL LOW (ref 1.0–8.0)
Troponin i, poc: <0.05 ng/mL (ref 0.00–0.09)

## 2011-03-15 LAB — COMPREHENSIVE METABOLIC PANEL
ALT: 19 U/L (ref 0–35)
Albumin: 4.4 g/dL (ref 3.5–5.2)
Alkaline Phosphatase: 60 U/L (ref 39–117)
BUN: 7 mg/dL (ref 6–23)
Chloride: 104 mEq/L (ref 96–112)
Glucose, Bld: 84 mg/dL (ref 70–99)
Potassium: 4.5 mEq/L (ref 3.5–5.1)
Sodium: 144 mEq/L (ref 135–145)
Total Bilirubin: 0.5 mg/dL (ref 0.3–1.2)
Total Protein: 7.7 g/dL (ref 6.0–8.3)

## 2011-03-15 LAB — DIFFERENTIAL
Basophils Absolute: 0.1 10*3/uL (ref 0.0–0.1)
Basophils Relative: 1 % (ref 0–1)
Eosinophils Absolute: 0.1 10*3/uL (ref 0.0–0.7)
Monocytes Absolute: 0.4 10*3/uL (ref 0.1–1.0)
Monocytes Relative: 6 % (ref 3–12)
Neutro Abs: 4.7 10*3/uL (ref 1.7–7.7)
Neutrophils Relative %: 66 % (ref 43–77)

## 2011-03-15 LAB — POCT CARDIAC MARKERS: Myoglobin, poc: 61 ng/mL (ref 12–200)

## 2011-03-15 LAB — CBC
HCT: 42.1 % (ref 36.0–46.0)
Hemoglobin: 14.5 g/dL (ref 12.0–15.0)
RDW: 11.8 % (ref 11.5–15.5)
WBC: 7.2 10*3/uL (ref 4.0–10.5)

## 2011-05-17 ENCOUNTER — Encounter: Payer: Self-pay | Admitting: Family Medicine

## 2011-05-17 ENCOUNTER — Inpatient Hospital Stay (INDEPENDENT_AMBULATORY_CARE_PROVIDER_SITE_OTHER)
Admission: RE | Admit: 2011-05-17 | Discharge: 2011-05-17 | Disposition: A | Discharge status: Home / Self Care | Payer: BC Managed Care – PPO | Source: Ambulatory Visit | Attending: Family Medicine | Admitting: Family Medicine

## 2011-05-17 ENCOUNTER — Telehealth: Payer: Self-pay | Admitting: Family Medicine

## 2011-05-17 DIAGNOSIS — R109 Unspecified abdominal pain: Secondary | ICD-10-CM

## 2011-05-17 LAB — CONVERTED CEMR LAB
Bacteria, UA: 0
Bilirubin Urine: NEGATIVE
Mucus, UA: 0
Nitrite: NEGATIVE
Urobilinogen, UA: 0.2
Yeast, UA: 0
pH: 6

## 2011-05-17 NOTE — Telephone Encounter (Signed)
Pt called and left mess for triage nurse and is experiencing some health issues and would like to talk with the nurse and not sure if this is her gallbladder. Plan:  Pt call returned and she went on to UC since we didn't have any office visit appts.  They were running labs and urine and at this point not sure what was going on with the patient. Jarvis Newcomer, LPN Domingo Dimes

## 2011-05-18 ENCOUNTER — Telehealth (INDEPENDENT_AMBULATORY_CARE_PROVIDER_SITE_OTHER): Payer: Self-pay | Admitting: *Deleted

## 2011-05-19 ENCOUNTER — Encounter: Payer: Self-pay | Admitting: Family Medicine

## 2011-05-20 ENCOUNTER — Ambulatory Visit (INDEPENDENT_AMBULATORY_CARE_PROVIDER_SITE_OTHER): Payer: BC Managed Care – PPO | Admitting: Family Medicine

## 2011-05-20 ENCOUNTER — Encounter: Payer: Self-pay | Admitting: Family Medicine

## 2011-05-20 DIAGNOSIS — R1032 Left lower quadrant pain: Secondary | ICD-10-CM

## 2011-05-20 DIAGNOSIS — R197 Diarrhea, unspecified: Secondary | ICD-10-CM

## 2011-05-20 NOTE — Patient Instructions (Signed)
Please call me Monday if you are not at least 30% better then we will get an abdominal CT.

## 2011-05-20 NOTE — Progress Notes (Signed)
Subjective:    Patient ID: Melinda Parsons, female    DOB: 08-Nov-1971, 40 y.o.   MRN: [id number]  HPI Seen on June 5th at [facility] for diarrhea and abdominal pain and back pain. Felt to be diverticulitis. Normal labs. Put on metronidazole and a liquid diet. Was also given cipro for possible UTI.  Fewer BMs but still having pain some lower abdominal pain and feels like a kink inside. Also feels like her lower back pain.  Had fever the first day at 99, none since then. No blood in the urine or stool.  Last night the urine was orange. No OTC meds. No hx of kidney stones.  Pain is intermittant. Father with hx of diverticulitis. No recent exposure to antibiotic. Not using any antidiarrheals.  I did review the results from Urgent Care.    BP 135/93  Pulse 84  Resp 20  Ht 5\' 3"  (1.6 m)  Wt 135 lb (61.236 kg)  BMI 23.91 kg/m2  SpO2 100%  LMP 04/03/2011    No Known Allergies  Past Medical History  Diagnosis Date  . Pre-eclampsia     with daughter  . GERD (gastroesophageal reflux disease)   . PVC (premature ventricular contraction)     with short runs of SVT    No past surgical history on file.  History   Social History  . Marital Status: Married    Spouse Name: N/A    Number of Children: N/A  . Years of Education: N/A   Occupational History  . Not on file.   Social History Main Topics  . Smoking status: Never Smoker   . Smokeless tobacco: Not on file  . Alcohol Use: 0.0 oz/week    1-2 drink(s) per week     per month  . Drug Use: No  . 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   Other Topics Concern  . Not on file   Social History Narrative  . No narrative on file    Family History  Problem Relation Age of Onset  . Hypertension Mother   . Hyperlipidemia Mother   . Hypertension      grandmother  . Alcohol abuse      grandparent    Current outpatient prescriptions:ciprofloxacin (CIPRO) 250 MG tablet, Take 250 mg  by mouth 2 (two) times daily.  , Disp: , Rfl: ;  etonogestrel-ethinyl estradiol (NUVARING) 0.12-0.015 MG/24HR vaginal ring, Place 1 each vaginally every 28 (twenty-eight) days. Insert vaginally and leave in place for 3 consecutive weeks, then remove for 1 week. , Disp: , Rfl: ;  hydrochlorothiazide 25 MG tablet, Take 25 mg by mouth daily.  , Disp: , Rfl:  metroNIDAZOLE (FLAGYL) 500 MG tablet, Take 500 mg by mouth every 6 (six) hours.  , Disp: , Rfl: ;  esomeprazole (NEXIUM) 40 MG capsule, Take 40 mg by mouth daily before breakfast.  , Disp: , Rfl: ;  loratadine (CLARITIN) 10 MG tablet, Take 10 mg by mouth daily.  , Disp: , Rfl:     Review of Systems     Objective:   Physical Exam  Constitutional: She is oriented to person, place, and time. She appears well-developed and well-nourished.  HENT:  Head: Normocephalic and atraumatic.  Neck: Neck supple. No thyromegaly present.  Cardiovascular: Normal rate, regular rhythm and normal heart sounds.   Pulmonary/Chest: Effort normal and breath sounds normal.  Abdominal: Soft. Bowel sounds are normal. She exhibits  no distension and no mass. There is tenderness. There is no rebound and no guarding.       Tender in the epigastrum and the left lower quadrant and left upper quadrant.  Lymphadenopathy:    She has no cervical adenopathy.  Neurological: She is alert and oriented to person, place, and time.  Skin: Skin is warm and dry.  Psychiatric: She has a normal mood and affect.          Assessment & Plan:  Diarrhea with abdominal pain. She feels about the same, but not worse. No fever and she is able to eat. No nausea or vomiting.  Discussed that she has been on the ABX between 48-72 hours.  I want her to give it through the weekend (2 more days) to see if she feels better. If not better by Monday then will get an abdomina CT to further evaluate her pain and diarrhea.  She has not been on ABX recently to c . Diff would be unlikely. Likely colitis -  unsure if viral or bacterial.  Go back to liquid diet if that helped her sxs some.

## 2011-05-26 ENCOUNTER — Telehealth: Payer: Self-pay | Admitting: Family Medicine

## 2011-05-26 DIAGNOSIS — R197 Diarrhea, unspecified: Secondary | ICD-10-CM

## 2011-05-26 NOTE — Telephone Encounter (Signed)
OK will order ABD CT to further evaluation. Can you call the lab to confirm they are running a c diff toxin.  I still dont' have the results.

## 2011-05-26 NOTE — Telephone Encounter (Signed)
Per Dr Linford Arnold no C diff toxin ordered. Abd/Pelvi U?S sched for 6/15 at 10:00 at [facility]. Auth # [id number].Pt agreed

## 2011-05-26 NOTE — Telephone Encounter (Signed)
Pt called in and needs clarification since she is still C/O pain.  #3-4/10 (Very uncomfortable even though the pain isn't that bad.  Very nauseted.  Feels like a twisting type cramp sensation on RT side.  Bloated, and unable to eat hamburgers, steak, chicken parmesan.  Feels tired.  Please advise.  Pt actually felt a little better on Thursday, but now she feels back to square 1 again.  Pt is afebrile. Plan:  Routed to Dr. Marlyne Beards, LPN [facility] Dimes

## 2011-05-27 ENCOUNTER — Ambulatory Visit
Admission: RE | Admit: 2011-05-27 | Discharge: 2011-05-27 | Disposition: A | Discharge status: Home / Self Care | Payer: BC Managed Care – PPO | Source: Ambulatory Visit | Attending: Family Medicine | Admitting: Family Medicine

## 2011-05-27 DIAGNOSIS — R197 Diarrhea, unspecified: Secondary | ICD-10-CM

## 2011-05-27 MED ORDER — IOHEXOL 300 MG/ML  SOLN
100.0000 mL | Freq: Once | INTRAMUSCULAR | Status: AC | PRN
  Administered 2011-05-27: 100 mL via INTRAVENOUS

## 2011-05-30 ENCOUNTER — Telehealth: Payer: Self-pay | Admitting: Family Medicine

## 2011-05-30 DIAGNOSIS — R109 Unspecified abdominal pain: Secondary | ICD-10-CM

## 2011-05-30 NOTE — Telephone Encounter (Signed)
Pt calls and wants result of CT, as she still has the pain.

## 2011-05-30 NOTE — Telephone Encounter (Signed)
Addended by: Nani Gasser D on: 05/30/2011 01:42 PM   Modules accepted: Orders

## 2011-05-30 NOTE — Telephone Encounter (Addendum)
CT was normal.  Keep taking Nexium and we can refer to GI for further evaluation. Avoid trigger foods like meats, etc.

## 2011-05-31 NOTE — Telephone Encounter (Signed)
Advised pt of rec and referral info.

## 2011-06-09 ENCOUNTER — Telehealth: Payer: Self-pay | Admitting: Family Medicine

## 2011-06-09 NOTE — Telephone Encounter (Signed)
Pt called and said she saw GI Helene Bernstein she was experiencing fever, swelling of lymph nodes and the GI doc suggested she call our office back to let us know she failed to tell us this when she was in. Pt was actually seen on 05-20-11 in our office.  At that time she was seen for abd pain.  It suggested if got no better would do CT.  Please advise if pt should come back in for office visit or do CT?? Routed to Dr. Arlice Colt, LPN [facility] Dimes

## 2011-06-11 NOTE — Telephone Encounter (Signed)
I agree. This sounds like a totally different issue than what I saw her for before. She will need an appt

## 2011-06-13 ENCOUNTER — Telehealth: Payer: Self-pay | Admitting: Family Medicine

## 2011-06-13 NOTE — Telephone Encounter (Signed)
LMOM advising pt to call and sched an OV re these new sx.

## 2011-06-13 NOTE — Telephone Encounter (Signed)
Pt called in and wants to know what to do since she has been seen by the GI specialist. Pt has lymph nodes that swell off/on under her arms, low grade temp of 99.5 oral.  These symptoms come and go.  Does have the abdominal pain today that she has had X 4 weeks.  Has experience loss of weight from 137 down to 129-131.  Had two Ct's that have been normal.  Had gallbladder U/S that was normal last year.  Scheduled tomorrow for another tes ? hida scan for the gallbladder.   Plan:  Told pt since she has no low grade temp or swollen lymph nodes today that we'll hold off to get the results of the test she is doing tomorrow and see what that shows and then go from there.

## 2011-06-17 ENCOUNTER — Encounter: Payer: Self-pay | Admitting: Family Medicine

## 2011-06-17 ENCOUNTER — Ambulatory Visit (INDEPENDENT_AMBULATORY_CARE_PROVIDER_SITE_OTHER): Payer: BC Managed Care – PPO | Admitting: Family Medicine

## 2011-06-17 VITALS — BP 128/90 | HR 82 | Temp 98.8°F | Resp 20 | Ht 63.0 in | Wt 133.0 lb

## 2011-06-17 DIAGNOSIS — R599 Enlarged lymph nodes, unspecified: Secondary | ICD-10-CM

## 2011-06-17 DIAGNOSIS — R590 Localized enlarged lymph nodes: Secondary | ICD-10-CM

## 2011-06-17 LAB — CBC WITH DIFFERENTIAL/PLATELET
Eosinophils Relative: 3 % (ref 0–5)
HCT: 39.3 % (ref 36.0–46.0)
Hemoglobin: 13.2 g/dL (ref 12.0–15.0)
Lymphocytes Relative: 39 % (ref 12–46)
Lymphs Abs: 2.8 10*3/uL (ref 0.7–4.0)
MCV: 89.7 fL (ref 78.0–100.0)
Monocytes Absolute: 0.5 10*3/uL (ref 0.1–1.0)
Monocytes Relative: 7 % (ref 3–12)
Neutro Abs: 3.7 10*3/uL (ref 1.7–7.7)
WBC: 7.3 10*3/uL (ref 4.0–10.5)

## 2011-06-17 LAB — TSH: TSH: 0.615 u[IU]/mL (ref 0.350–4.500)

## 2011-06-17 NOTE — Progress Notes (Signed)
  Subjective:    Patient ID: Melinda Parsons, female    DOB: 1971-10-06, 40 y.o.   MRN: [id number]  HPI  About 2-3 year ago has swelling in her axilla nad had a mammogram that was normal. Went away.  Then has been intermittant for the last 2 years. LN are not swollen right now.  Will have low grade fevers when this happens sometimes (99.5).  Mom with hx of brCa dx 61.   Will get flushed after eating breakfast.  Feels hot all the time. Started around the time of her stomach problems. She denies any enlarged lymph nodes or any other serious such as a corner around her neck. She was actually recently on antibiotics and said that while on antibiotics she got a lymphadenopathy. In about a time she completed the antibiotic for lymphadenopathy still persisted. When they're swollen her axilla are very tender even has to move her arms around. She is not currently symptomatic. No other exacerbating or alleviating symptoms.  Review of Systems     Objective:   Physical Exam  Constitutional: She appears well-developed and well-nourished.  Neck: No thyromegaly present.  Lymphadenopathy:    She has no cervical adenopathy.    She has no axillary adenopathy.       Right: No supraclavicular adenopathy present.       Left: No supraclavicular adenopathy present.          Assessment & Plan:  Axillary lymphadenopathy. She has a completely normal exam today and admits she is not having current symptoms. She is concerned because it does tend to flare up at times and couple times has got a low-grade fever with this. I gave her reassurance that typically if it's from cancer the lymph nodes don't go away and resolved. They  persist and continue to enlarge. Certainly if she gets a lymph node that is persistent then we can consider biopsy for general surgery. In the meantime and check a CBC with differential as well as a thyroid level. She also complains of getting sweaty after eating breakfast. This is accompanied by  flushing as well. The started with her abdominal pain. this certainly could be related but we will check a thyroid level. I will see if there are any protocols published recently about treating recurrent lymphadenopathy or further workup for this condition. I also explained that the lymph nodes can become enlarged to help fight infections bacterial or viral. It's possible that she could be getting irritation or breaks in the skin from shaving her underarms and this could also be causing flares. I do recommend that she not use a razor for too long of a period and is more likely to cause headaches and clots and to introduce bacteria into the skin. Also she had a normal breast exam a couple of months ago with her GYN we will schedule her for a mammogram to further rule out any other cause of axillary lymphadenopathy. She does have a family history of breast cancer.

## 2011-06-20 ENCOUNTER — Telehealth: Payer: Self-pay | Admitting: Family Medicine

## 2011-06-20 NOTE — Telephone Encounter (Signed)
Advised pt of results.

## 2011-06-20 NOTE — Telephone Encounter (Signed)
Call patient: Thyroid and complete blood counts look normal. Typically she should be contacted sometime this week about scheduling her mammogram.

## 2011-07-01 ENCOUNTER — Telehealth: Payer: Self-pay | Admitting: Family Medicine

## 2011-07-01 DIAGNOSIS — R591 Generalized enlarged lymph nodes: Secondary | ICD-10-CM

## 2011-07-01 NOTE — Telephone Encounter (Signed)
Call pt: did reading on lymph nodes. We should repet in her mammo if has been over a year. Also If she has a cat we should check her for cat scratch disease.  Does she have breast inmplants. If so these could be leaking and causing swelling of her LN.  O/W can consider bx next time they swell.

## 2011-07-07 ENCOUNTER — Other Ambulatory Visit: Payer: Self-pay | Admitting: Family Medicine

## 2011-07-07 ENCOUNTER — Ambulatory Visit
Admission: RE | Admit: 2011-07-07 | Discharge: 2011-07-07 | Disposition: A | Discharge status: Home / Self Care | Payer: BC Managed Care – PPO | Source: Ambulatory Visit | Attending: Family Medicine | Admitting: Family Medicine

## 2011-07-07 DIAGNOSIS — R590 Localized enlarged lymph nodes: Secondary | ICD-10-CM

## 2011-07-07 NOTE — Telephone Encounter (Signed)
advsied pt of rec. Will go for mammy today and get BW at her earliest convenience. Order faxed.

## 2011-07-07 NOTE — Telephone Encounter (Signed)
Pt states she has a mammogram sched for today at 3:30. She does have a cat, she does not have implants and states she has some sl "fullness" under her rt arm  Axilla.

## 2011-07-07 NOTE — Telephone Encounter (Signed)
Ok will see what the mammo results are and we can do bloodwork for cat scratch.

## 2011-07-08 ENCOUNTER — Telehealth: Payer: Self-pay | Admitting: Family Medicine

## 2011-07-08 NOTE — Telephone Encounter (Signed)
Call patient: Breast ultrasound and diagnostic mammogram were normal which is wonderful. Repeat mammogram in 1 year.

## 2011-07-09 LAB — BARTONELLA ANTIBODY PANEL: B henselae IgM: NEGATIVE

## 2011-07-10 ENCOUNTER — Telehealth: Payer: Self-pay | Admitting: Family Medicine

## 2011-07-10 NOTE — Telephone Encounter (Signed)
Call pt: Neg for cat scratch.  If nodes are swollen now I woul like her to followup with me.

## 2011-07-11 NOTE — Telephone Encounter (Signed)
Pt notified and and lymph nodes are not swollen now and she states she had breast exam and everything was fine there also

## 2011-07-11 NOTE — Telephone Encounter (Signed)
Pt aware.

## 2011-10-13 ENCOUNTER — Telehealth: Payer: Self-pay | Admitting: *Deleted

## 2011-10-13 DIAGNOSIS — Z0000 Encounter for general adult medical examination without abnormal findings: Secondary | ICD-10-CM

## 2011-10-13 DIAGNOSIS — I10 Essential (primary) hypertension: Secondary | ICD-10-CM

## 2011-10-14 LAB — CBC
MCH: 30.7 pg (ref 26.0–34.0)
Platelets: 297 10*3/uL (ref 150–400)
RBC: 4.73 MIL/uL (ref 3.87–5.11)
WBC: 6.6 10*3/uL (ref 4.0–10.5)

## 2011-10-14 LAB — COMPLETE METABOLIC PANEL WITH GFR
ALT: 14 U/L (ref 0–35)
BUN: 8 mg/dL (ref 6–23)
CO2: 27 mEq/L (ref 19–32)
Calcium: 9.4 mg/dL (ref 8.4–10.5)
Chloride: 102 mEq/L (ref 96–112)
Creat: 0.81 mg/dL (ref 0.50–1.10)
GFR, Est African American: >89 mL/min (ref >90)

## 2011-10-14 LAB — LIPID PANEL
Cholesterol: 182 mg/dL (ref 0–200)
Triglycerides: 110 mg/dL (ref <150)

## 2011-11-14 NOTE — Telephone Encounter (Signed)
  Phone Note Outgoing Call Call back at Home Phone [phone] [facility]     Call placed by: Lajean Saver RN,  May 18, 2011 4:13 PM Call placed to: Patient Action Taken: Phone Call Completed Summary of Call: Callback: Patient reports she is improving today. She ate only clear liquids yesterday and this AM. I advised her to slowly advance her diet with bland foods today. Given normal lab results.

## 2011-11-14 NOTE — Progress Notes (Signed)
Summary: stomach and back pain Room 5   Vital Signs:  Patient Profile:   40 Years Old Female CC:      Diarrhea, LBP  x 10 days LMP:     04/11/2011 Height:     63.5 inches Weight:      136 pounds O2 Sat:      99 % O2 treatment:    Room Air Temp:     99.2 degrees F oral Pulse rate:   75 / minute Pulse rhythm:   regular Resp:     12 per minute BP sitting:   149 / 105  (left arm) Cuff size:   regular  Vitals Entered By: Emilio Math (May 17, 2011 3:14 PM)  Menstrual History: LMP (date): 04/11/2011                  Current Allergies: No known allergies History of Present Illness Chief Complaint: Diarrhea, LBP  x 10 days History of Present Illness:  Subjective:  Patient complains of onset of vague intermittent lower abdominal pain about 1.5 weeks ago.  One week ago she began developing intermittent loose and partly formed bowel movements, and the pain became more pronounced after eating, and radiates to the lower back.  Today she developed nausea without vomiting, and has had sweats/chills.  No blood in bowel movements.  No urinary symptoms.  Last menstrual period was in April (she is on NuvaRing).  She has a history of GERD, but those symptoms are distinctly different.  No past surgical history.  Her father has a history of colonic polyps and diverticulosis  Current Meds NUVARING 0.12-0.015 MG/24HR RING (ETONOGESTREL-ETHINYL ESTRADIOL) as directed CLARITIN 10 MG TABS (LORATADINE) 1 tab once daily as needed HYDROCHLOROTHIAZIDE 25 MG TABS (HYDROCHLOROTHIAZIDE) Take one tablet by mouth daily. NEXIUM 40 MG CPDR (ESOMEPRAZOLE MAGNESIUM) 1 by mouth daily METRONIDAZOLE 500 MG TABS (METRONIDAZOLE) One by mouth q6hr CIPROFLOXACIN HCL 750 MG TABS (CIPROFLOXACIN HCL) One by mouth two times a day  REVIEW OF SYSTEMS Constitutional Symptoms      Denies fever, chills, night sweats, weight loss, weight gain, and fatigue.  Eyes       Denies change in vision, eye pain, eye discharge,  glasses, contact lenses, and eye surgery. Ear/Nose/Throat/Mouth       Denies hearing loss/aids, change in hearing, ear pain, ear discharge, dizziness, frequent runny nose, frequent nose bleeds, sinus problems, sore throat, hoarseness, and tooth pain or bleeding.  Respiratory       Denies dry cough, productive cough, wheezing, shortness of breath, asthma, bronchitis, and emphysema/COPD.  Cardiovascular       Denies murmurs, chest pain, and tires easily with exhertion.    Gastrointestinal       Complains of stomach pain and diarrhea.      Denies nausea/vomiting, constipation, blood in bowel movements, and indigestion. Genitourniary       Denies painful urination, kidney stones, and loss of urinary control. Neurological       Denies paralysis, seizures, and fainting/blackouts. Musculoskeletal       Denies muscle pain, joint pain, joint stiffness, decreased range of motion, redness, swelling, muscle weakness, and gout.  Skin       Denies bruising, unusual mles/lumps or sores, and hair/skin or nail changes.  Psych       Denies mood changes, temper/anger issues, anxiety/stress, speech problems, depression, and sleep problems.  Past History:  Past Medical History: Reviewed history from 12/23/2010 and no changes required. Pre-eclampsia with her daughter.  HTN ?GERD PACs with short runs of SVT  Past Surgical History: Reviewed history from 12/15/2009 and no changes required. None  Family History: Reviewed history from 11/30/2010 and no changes required. Mom with HTN, cholesterol GM with HTN Grandparent with alcoholism  Mother-alive, HTN, hyperliipidemia Father-alive, ? health  No CAD  Social History: Reviewed history from 12/23/2010 and no changes required. 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  Associate degree.   Married to Beatty with 1 daughter.   Never Smoked Alcohol use-yes- 1-2/month Drug use-no Regular exercise-yes   Objective:  Appearance:  Patient appears  healthy, stated age, and in no acute distress  Eyes:  Pupils are equal, round, and reactive to light and accomodation.  Extraocular movement is intact.  Conjunctivae are not inflamed.  Mouth/pharynx:  moist mucous membranes  Neck:  Supple.  No adenopathy is present.  No thyromegaly is present  Lungs:  Clear to auscultation.  Breath sounds are equal.  Heart:  Regular rate and rhythm without murmurs, rubs, or gallops.  Abdomen:  There is vague tenderness above the umbilicus and just to the left.  No rebound tenderness.  No masses or hepatosplenomegaly.  Bowel sounds are present.  Mild bilateral flank tenderness.  No lower abdominal or pelvic tenderness. Skin:  No rash. urinalysis (dipstick):  negative except +1 leuks; micro negative Urine pregnancy test negative CBC:  WBC 6.8; 45.4 LY, 6.6 MO, 48.0 GR; Hgb 13.3 Assessment New Problems: ABDOMINAL PAIN, ACUTE (ICD-789.00)  ? EARLY DIVERTICULITIS.  NOTE NORMAL WBC  Plan New Medications/Changes: METRONIDAZOLE 500 MG TABS (METRONIDAZOLE) One by mouth q6hr  #28 x 0, 05/17/2011, Donna Christen MD CIPROFLOXACIN HCL 750 MG TABS (CIPROFLOXACIN HCL) One by mouth two times a day  #14 x 0, 05/17/2011, Donna Christen MD METRONIDAZOLE 500 MG TABS (METRONIDAZOLE) One by mouth q6hr  #28 x 0, 05/17/2011, Donna Christen MD  New Orders: CBC w/Diff [96045-40981] Urinalysis [CPT-81003] Urine Pregnancy Test  [81025] T-CMP with estimated GFR [[address]] T-Amylase [82150-23210] T-Lipase [83690-23215] Hemoccult Cards -3 specimans (take home) [82272] Est. Patient Level IV [19147] Planning Comments:   Check CMP, amylase, lipase Begin clear liquids today, and slowly advance diet. Begin empiric Cipro and Flagyl. Return 3 hemoccult cards. Follow-up with PCP in about 3 days.  Return for increasing pain, fever, vomiting etc.   The patient and/or caregiver has been counseled thoroughly with regard to medications prescribed including dosage, schedule, interactions,  rationale for use, and possible side effects and they verbalize understanding.  Diagnoses and expected course of recovery discussed and will return if not improved as expected or if the condition worsens. Patient and/or caregiver verbalized understanding.  Prescriptions: METRONIDAZOLE 500 MG TABS (METRONIDAZOLE) One by mouth q6hr  #28 x 0   Entered and Authorized by:   Donna Christen MD   Signed by:   Donna Christen MD on 05/17/2011   Method used:   Print then Give to Patient   RxID:   [id number] CIPROFLOXACIN HCL 750 MG TABS (CIPROFLOXACIN HCL) One by mouth two times a day  #14 x 0   Entered and Authorized by:   Donna Christen MD   Signed by:   Donna Christen MD on 05/17/2011   Method used:   Print then Give to Patient   RxID:   [id number] METRONIDAZOLE 500 MG TABS (METRONIDAZOLE) One by mouth q6hr  #28 x 0   Entered and Authorized by:   Donna Christen MD   Signed by:   Donna Christen MD on 05/17/2011   Method used:  Electronically to        [facility] [phone]* (retail)       [address] [place], [place]  [address]       Ph: [phone]       Fax: [phone]   RxID:   [id number]   Orders Added: 1)  CBC w/Diff [10272-53664] 2)  Urinalysis [CPT-81003] 3)  Urine Pregnancy Test  [81025] 4)  T-CMP with estimated GFR [[address]] 5)  T-Amylase [82150-23210] 6)  T-Lipase [83690-23215] 7)  Hemoccult Cards -3 specimans (take home) [82272] 8)  Est. Patient Level IV [99214]    Laboratory Results   Urine Tests  Date/Time Received: May 17, 2011 3:56 PM  Date/Time Reported: May 17, 2011 3:56 PM   Routine Urinalysis   Color: yellow Appearance: Clear Glucose: negative   (Normal Range: Negative) Bilirubin: negative   (Normal Range: Negative) Ketone: negative   (Normal Range: Negative) Spec. Gravity: 1.010   (Normal Range: 1.003-1.035) Blood: negative   (Normal Range: Negative) pH: 6.0   (Normal Range: 5.0-8.0) Protein: negative   (Normal Range:  Negative) Urobilinogen: 0.2   (Normal Range: 0-1) Nitrite: negative   (Normal Range: Negative) Leukocyte Esterace: 1+   (Normal Range: Negative)  Urine Microscopic WBC/HPF: 0 RBC/HPF: 0 Bacteria/HPF: 0 Mucous/HPF: 0 Epithelial/HPF: 0 Crystals/HPF: rare Casts/LPF: 0 Yeast/HPF: 0 Other: 0    Urine HCG: negative

## 2011-12-30 ENCOUNTER — Other Ambulatory Visit: Payer: Self-pay | Admitting: *Deleted

## 2011-12-30 MED ORDER — HYDROCHLOROTHIAZIDE 25 MG PO TABS: 25.0000 mg | ORAL_TABLET | Freq: Every day | ORAL | Status: DC

## 2012-02-16 ENCOUNTER — Encounter: Payer: Self-pay | Admitting: *Deleted

## 2012-02-16 ENCOUNTER — Emergency Department
Admit: 2012-02-16 | Discharge: 2012-02-16 | Disposition: A | Discharge status: Home / Self Care | Payer: BC Managed Care – PPO

## 2012-02-16 ENCOUNTER — Emergency Department
Admission: EM | Admit: 2012-02-16 | Discharge: 2012-02-16 | Disposition: A | Discharge status: Home Health | Payer: BC Managed Care – PPO | Source: Home / Self Care | Attending: Family Medicine | Admitting: Family Medicine

## 2012-02-16 DIAGNOSIS — S6000XA Contusion of unspecified finger without damage to nail, initial encounter: Secondary | ICD-10-CM

## 2012-02-16 NOTE — ED Provider Notes (Signed)
History     CSN: [id number]  Arrival date & time 02/16/12  1803   First MD Initiated Contact with Patient 02/16/12 1819      Chief Complaint  Patient presents with  . Finger Injury      HPI Comments: Patient reports that car hatchback door accidentally closed on her right second finger today.  Complains of pain in first joint of finger.  Patient is a 41 y.o. female presenting with hand pain. The history is provided by the patient.  Hand Pain The current episode started 6 to 12 hours ago. The problem occurs constantly. The problem has not changed since onset.Exacerbated by: movement of finger. The symptoms are relieved by nothing. She has tried nothing for the symptoms.    Past Medical History  Diagnosis Date  . Pre-eclampsia     with daughter  . GERD (gastroesophageal reflux disease)   . PVC (premature ventricular contraction)     with short runs of SVT    History reviewed. No pertinent past surgical history.  Family History  Problem Relation Age of Onset  . Hypertension Mother   . Hyperlipidemia Mother   . Hypertension      grandmother  . Alcohol abuse      grandparent  . Diverticulitis Father     History  Substance Use Topics  . Smoking status: Never Smoker   . Smokeless tobacco: Not on file  . Alcohol Use: 0.0 oz/week    1-2 drink(s) per week     per month    OB History    Grav Para Term Preterm Abortions TAB SAB Ect Mult Living                  Review of Systems  All other systems reviewed and are negative.    Allergies  Review of patient's allergies indicates no known allergies.  Home Medications   Current Outpatient Rx  Name Route Sig Dispense Refill  . CIPROFLOXACIN HCL 250 MG PO TABS Oral Take 250 mg by mouth 2 (two) times daily.      Marland Kitchen ESOMEPRAZOLE MAGNESIUM 40 MG PO CPDR Oral Take 40 mg by mouth daily before breakfast.      . ETONOGESTREL-ETHINYL ESTRADIOL 0.12-0.015 MG/24HR VA RING Vaginal Place 1 each vaginally every 28 (twenty-eight)  days. Insert vaginally and leave in place for 3 consecutive weeks, then remove for 1 week.     Marland Kitchen HYDROCHLOROTHIAZIDE 25 MG PO TABS Oral Take 1 tablet (25 mg total) by mouth daily. 30 tablet 5  . LORATADINE 10 MG PO TABS Oral Take 10 mg by mouth daily.      Marland Kitchen METRONIDAZOLE 500 MG PO TABS Oral Take 500 mg by mouth every 6 (six) hours.        BP 140/89  Pulse 85  Temp(Src) 98.5 F (36.9 C) (Oral)  Resp 16  Ht 5\' 3"  (1.6 m)  Wt 138 lb (62.596 kg)  BMI 24.45 kg/m2  SpO2 98%  LMP 02/16/2012  Physical Exam  Constitutional: She appears well-developed and well-nourished. No distress.  Musculoskeletal:       Right hand: She exhibits decreased range of motion, tenderness, bony tenderness and swelling. She exhibits normal two-point discrimination, normal capillary refill, no deformity and no laceration. normal sensation noted.       Hands:       Right second finger has tenderness, ecchymosis, and mild swelling of the PIP joint.  Flexion and extension is intact.      ED  Course  Procedures  none    Dg Finger Index Right  02/16/2012  *RADIOLOGY REPORT*  Clinical Data: Trauma.  Pain to PIP joint.  RIGHT INDEX FINGER 2+V  Comparison: None  Findings: There is no evidence of fracture or dislocation.  There is no evidence of arthropathy or other focal bone abnormality. Soft tissues are unremarkable  IMPRESSION: Negative exam.  Original Report Authenticated By: Rosealee Albee, M.D.     1. Contusion, finger       MDM  Will treat as a sprain.  Finger splint applied Apply ice pack for about 15 minutes every 1 to 4 hours.  Continue until swelling decreases.  Take Ibuprofen 200mg , 4 tabs every 8 hours with food Wear splint for about 5 days then begin range of motion exercises ([facility] information and instruction handout given)  Followup with [facility] if not improving about two weeks.         Donna Christen, MD 02/16/12 [phone]

## 2012-02-16 NOTE — ED Notes (Signed)
Pt c/o RT 2nd finger injury x this AM. She states that she closed it in the hatchback of her car.

## 2012-02-16 NOTE — Discharge Instructions (Signed)
Apply ice pack for about 15 minutes every 1 to 4 hours.  Continue until swelling decreases.  Take Ibuprofen 200mg , 4 tabs every 8 hours with food.  Wear splint for about 5 days then begin range of motion exercises.  Contusion A contusion is a deep bruise. Contusions happen when an injury causes bleeding under the skin. Signs of bruising include pain, puffiness (swelling), and discolored skin. The contusion may turn blue, purple, or yellow. HOME CARE   Put ice on the injured area.   Put ice in a plastic bag.   Place a towel between your skin and the bag.   Leave the ice on for 15 to 20 minutes, 3 to 4 times a day.   Only take medicine as told by your doctor.   Rest the injured area.   If possible, raise (elevate) the injured area to lessen puffiness.  GET HELP RIGHT AWAY IF:   You have more bruising or puffiness.   You have pain that is getting worse.   Your puffiness or pain is not helped by medicine.  MAKE SURE YOU:   Understand these instructions.   Will watch your condition.   Will get help right away if you are not doing well or get worse.  Document Released: 05/16/2008 Document Revised: 11/17/2011 Document Reviewed: 10/03/2011 [facility] Patient Information 2012 [place], [place].

## 2012-03-14 ENCOUNTER — Telehealth: Payer: Self-pay | Admitting: *Deleted

## 2012-03-14 NOTE — Telephone Encounter (Signed)
Pt informed and was told if not any better to call and make appt when she gets back in town.

## 2012-03-14 NOTE — Telephone Encounter (Signed)
Tell her to try Mucinex twice a day and drink lots of water. Load up on Vitamin C and Zinc. Could consider sinus rinses such as neil med sinus rinse. Take advil for headaches or pains.

## 2012-03-14 NOTE — Telephone Encounter (Signed)
Pt states she is out of town and thinks she has a sinus infection and would like to know what she can take. Please advise.

## 2012-09-07 ENCOUNTER — Telehealth: Payer: Self-pay | Admitting: *Deleted

## 2012-09-07 NOTE — Telephone Encounter (Signed)
Pt notified of MD instructions. Instructed pt if no better by Monday or worse to call and schedule appt. KG LPN

## 2012-09-07 NOTE — Telephone Encounter (Signed)
She can use nasal saline to help with the mucus and drainage. She can using lozenges if she has sore throat. She can use over-the-counter Tylenol  for pain or fever.  Recommend using humidifier at night. Can use Afrin just at bedtime. It is topical so doesn't tend to bump blood pressure much.

## 2012-09-07 NOTE — Telephone Encounter (Signed)
Pt states she feels like she is getting a sinus infection. States she has a HA and pressure on one side of her head and would like to know what she can take OTC since she has HTN.

## 2012-10-03 ENCOUNTER — Other Ambulatory Visit: Payer: Self-pay | Admitting: Family Medicine

## 2012-10-17 ENCOUNTER — Encounter: Payer: Self-pay | Admitting: *Deleted

## 2012-10-17 ENCOUNTER — Ambulatory Visit (INDEPENDENT_AMBULATORY_CARE_PROVIDER_SITE_OTHER): Payer: BC Managed Care – PPO | Admitting: Family Medicine

## 2012-10-17 ENCOUNTER — Encounter: Payer: Self-pay | Admitting: Family Medicine

## 2012-10-17 VITALS — BP 105/76 | HR 120 | Ht 64.0 in | Wt 128.0 lb

## 2012-10-17 DIAGNOSIS — R002 Palpitations: Secondary | ICD-10-CM

## 2012-10-17 DIAGNOSIS — R61 Generalized hyperhidrosis: Secondary | ICD-10-CM

## 2012-10-17 DIAGNOSIS — I10 Essential (primary) hypertension: Secondary | ICD-10-CM

## 2012-10-17 DIAGNOSIS — R634 Abnormal weight loss: Secondary | ICD-10-CM

## 2012-10-17 DIAGNOSIS — Z23 Encounter for immunization: Secondary | ICD-10-CM

## 2012-10-17 DIAGNOSIS — H539 Unspecified visual disturbance: Secondary | ICD-10-CM

## 2012-10-17 NOTE — Patient Instructions (Addendum)
We will call you with your lab results. If you don't here from us in about a week then please give us a call at [phone].  

## 2012-10-17 NOTE — Progress Notes (Signed)
Subjective:    Patient ID: Melinda Parsons, female    DOB: 1970-12-21, 41 y.o.   MRN: [id number]  HPI Says has noticed increase in her HR. Was exercising daily for an hour.  Lost about 12 lbs, but has been trying to lose weight.  Feeling sweats, esp in the AM. Started about 2 months ago.  Says called her gyn.  Says she is on the nuvaring.  She thought initially it was menopause. No fever.  No skin or hair changes.  Occ has chest pain. No hx of thyroid problems. Sleeping ok at night.  Has had days where vision is fuzzy.  Has eye appt later this month.  More freq HA, tension type that last for days. Thought says not like her migraines, as she doesn't get a aura with these HA.  No blood in the urine or stool. Feels likeshe is having ot push through her workout more. No significant abdominal pain. She does have a family history breast cancer her mammogram scheduled for later this month. She also has a vision exam scheduled for later this month.   Review of Systems  BP 105/76  Pulse 120  Ht 5\' 4"  (1.626 m)  Wt 128 lb (58.06 kg)  BMI 21.97 kg/m2    No Known Allergies  Past Medical History  Diagnosis Date  . Pre-eclampsia     with daughter  . GERD (gastroesophageal reflux disease)   . PVC (premature ventricular contraction)     with short runs of SVT    No past surgical history on file.  History   Social History  . Marital Status: Married    Spouse Name: N/A    Number of Children: N/A  . Years of Education: N/A   Occupational History  . Not on file.   Social History Main Topics  . Smoking status: Never Smoker   . Smokeless tobacco: Not on file  . Alcohol Use: 0.0 oz/week    1-2 drink(s) per week     Comment: per month  . Drug Use: No  . 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   Other Topics Concern  . Not on file   Social History Narrative  . No narrative on file    Family History  Problem Relation  Age of Onset  . Hypertension Mother   . Hyperlipidemia Mother   . Hypertension      grandmother  . Alcohol abuse      grandparent  . Diverticulitis Father     Outpatient Encounter Prescriptions as of 10/17/2012  Medication Sig Dispense Refill  . esomeprazole (NEXIUM) 40 MG capsule Take 40 mg by mouth daily before breakfast.        . etonogestrel-ethinyl estradiol (NUVARING) 0.12-0.015 MG/24HR vaginal ring Place 1 each vaginally every 28 (twenty-eight) days. Insert vaginally and leave in place for 3 consecutive weeks, then remove for 1 week.       . loratadine (CLARITIN) 10 MG tablet Take 10 mg by mouth daily.        . [DISCONTINUED] hydrochlorothiazide (HYDRODIURIL) 25 MG tablet Take 1 tablet (25 mg total) by mouth daily.  30 tablet  5  . [DISCONTINUED] ciprofloxacin (CIPRO) 250 MG tablet Take 250 mg by mouth 2 (two) times daily.        . [DISCONTINUED] metroNIDAZOLE (FLAGYL) 500 MG tablet Take 500 mg by mouth every 6 (six) hours.  Objective:   Physical Exam  Constitutional: She is oriented to person, place, and time. She appears well-developed and well-nourished.  HENT:  Head: Normocephalic and atraumatic.  Right Ear: External ear normal.  Left Ear: External ear normal.  Nose: Nose normal.  Mouth/Throat: Oropharynx is clear and moist.       TMs and canals are clear.   Eyes: Conjunctivae normal and EOM are normal. Pupils are equal, round, and reactive to light.  Neck: Neck supple. No thyromegaly present.  Cardiovascular: Normal rate, regular rhythm and normal heart sounds.   Pulmonary/Chest: Effort normal and breath sounds normal. She has no wheezes.  Abdominal: Bowel sounds are normal. She exhibits no distension and no mass. There is tenderness. There is no rebound and no guarding.       TEnder to the left of the umbilicus.   Lymphadenopathy:    She has no cervical adenopathy.  Neurological: She is alert and oriented to person, place, and time.  Skin: Skin is  warm and dry.  Psychiatric: She has a normal mood and affect.          Assessment & Plan:  Palpitations - Will eval for thyroid d/o and anemia.  Will check electrolytes.  EKG shows rate of 80 bpm, NSR, no acute changes. Normal axis.  This is reassuring. If her blood work is normal then consider further cardiac workup. Blood pressure looks great today.  HTN - Very well controlled.  Will stop her diuretic. Repeat BP in 2 month. Continue with healthy diet.  Sweats - May be related to thyroid problems. I agree that she is not likley to be menopausal since she is on Nuvaring.   Blurry vision-she has an eye appointment rescheduled. This certainly could recall spot a thyroid issue. Next  Weight loss-she has been working out so certainly this could explain her weight loss. She does have a mammogram scheduled for later this month as well. She does have a family history of breast cancer.

## 2012-10-18 ENCOUNTER — Other Ambulatory Visit: Payer: Self-pay | Admitting: Family Medicine

## 2012-10-18 DIAGNOSIS — R002 Palpitations: Secondary | ICD-10-CM

## 2012-10-18 LAB — COMPLETE METABOLIC PANEL WITH GFR
ALT: 20 U/L (ref 0–35)
AST: 23 U/L (ref 0–37)
Albumin: 4.2 g/dL (ref 3.5–5.2)
Alkaline Phosphatase: 40 U/L (ref 39–117)
Calcium: 9.3 mg/dL (ref 8.4–10.5)
Chloride: 100 mEq/L (ref 96–112)
Potassium: 4.5 mEq/L (ref 3.5–5.3)

## 2012-10-18 LAB — CBC WITH DIFFERENTIAL/PLATELET
Basophils Absolute: 0.1 10*3/uL (ref 0.0–0.1)
HCT: 42.5 % (ref 36.0–46.0)
Lymphocytes Relative: 32 % (ref 12–46)
Neutro Abs: 4 10*3/uL (ref 1.7–7.7)
Neutrophils Relative %: 59 % (ref 43–77)
Platelets: 276 10*3/uL (ref 150–400)
RDW: 12.8 % (ref 11.5–15.5)
WBC: 6.8 10*3/uL (ref 4.0–10.5)

## 2012-10-18 LAB — T4, FREE: Free T4: 1.22 ng/dL (ref 0.80–1.80)

## 2012-10-18 LAB — TSH: TSH: 0.98 u[IU]/mL (ref 0.350–4.500)

## 2012-12-03 ENCOUNTER — Telehealth: Payer: Self-pay | Admitting: *Deleted

## 2012-12-03 MED ORDER — OSELTAMIVIR PHOSPHATE 75 MG PO CAPS: 75.0000 mg | ORAL_CAPSULE | Freq: Two times a day (BID) | ORAL | Status: DC

## 2012-12-03 NOTE — Telephone Encounter (Signed)
Rx sent for Mr and Mrs [facility]

## 2012-12-03 NOTE — Telephone Encounter (Signed)
Pt would like a rx for tamiflu sent to her pharm for herself and her husband. ( i will send a phone note for him) Her daughter has been dx with the flu. [employer] Melinda Parsons

## 2012-12-03 NOTE — Telephone Encounter (Signed)
Pt aware.

## 2012-12-26 ENCOUNTER — Ambulatory Visit (INDEPENDENT_AMBULATORY_CARE_PROVIDER_SITE_OTHER): Payer: BC Managed Care – PPO | Admitting: Cardiology

## 2012-12-26 ENCOUNTER — Encounter: Payer: Self-pay | Admitting: Cardiology

## 2012-12-26 VITALS — BP 129/82 | HR 82 | Wt 128.0 lb

## 2012-12-26 DIAGNOSIS — I10 Essential (primary) hypertension: Secondary | ICD-10-CM

## 2012-12-26 DIAGNOSIS — R002 Palpitations: Secondary | ICD-10-CM

## 2012-12-26 NOTE — Assessment & Plan Note (Signed)
Etiology unclear. Previous echo showed normal LV function and recent thyroid function is normal. Schedule CardioNet.

## 2012-12-26 NOTE — Assessment & Plan Note (Signed)
Previously diagnosed with hypertension. Blood pressure is presently controlled with no medications.

## 2012-12-26 NOTE — Progress Notes (Signed)
  HPI: 42 year old female for evaluation of palpitations. Echocardiogram in December 2011 showed normal LV function. Laboratories in November 2013 showed normal thyroid functions, normal hemoglobin and normal potassium. Recently the patient has noticed palpitations. She notices these in the morning predominantly. She states her heart rate will be approximately 140-150. This typically lasts one half hour and resolves spontaneously. No associated presyncope, syncope, dyspnea or chest pain. She otherwise does not have dyspnea on exertion, orthopnea, PND, pedal edema or exertional chest pain. Because of the above we were asked to evaluate.  Current Outpatient Prescriptions  Medication Sig Dispense Refill  . etonogestrel-ethinyl estradiol (NUVARING) 0.12-0.015 MG/24HR vaginal ring Place 1 each vaginally every 28 (twenty-eight) days. Insert vaginally and leave in place for 3 consecutive weeks, then remove for 1 week.       . loratadine (CLARITIN) 10 MG tablet Take 10 mg by mouth daily.          No Known Allergies  Past Medical History  Diagnosis Date  . Pre-eclampsia     with daughter  . GERD (gastroesophageal reflux disease)   . PVC (premature ventricular contraction)     Previous holter in Dec 2011 showed PVCs and 2 3 beat runs of SVT  . Hypertension   . IBS (irritable bowel syndrome)     No past surgical history on file.  History   Social History  . Marital Status: Married    Spouse Name: N/A    Number of Children: 1  . Years of Education: N/A   Occupational History  .  [employer]   Social History Main Topics  . Smoking status: Never Smoker   . Smokeless tobacco: Not on file  . Alcohol Use: 0.0 oz/week    1-2 drink(s) per week     Comment: per month  . Drug Use: No  . 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   Other Topics Concern  . Not on file   Social History Narrative  . No  narrative on file    Family History  Problem Relation Age of Onset  . Hypertension Mother   . Hyperlipidemia Mother   . Hypertension      grandmother  . Alcohol abuse      grandparent  . Diverticulitis Father     ROS: no fevers or chills, productive cough, hemoptysis, dysphasia, odynophagia, melena, hematochezia, dysuria, hematuria, rash, seizure activity, orthopnea, PND, pedal edema, claudication. Remaining systems are negative.  Physical Exam:   Blood pressure 129/82, pulse 82, weight 128 lb (58.06 kg).  General:  Well developed/well nourished in NAD Skin warm/dry Patient not depressed No peripheral clubbing Back-normal HEENT-normal/normal eyelids Neck supple/normal carotid upstroke bilaterally; no bruits; no JVD; no thyromegaly chest - CTA/ normal expansion CV - RRR/normal S1 and S2; no murmurs, rubs or gallops;  PMI nondisplaced Abdomen -NT/ND, no HSM, no mass, + bowel sounds, no bruit 2+ femoral pulses, no bruits Ext-no edema, chords, 2+ DP Neuro-grossly nonfocal  ECG 10/17/2012-sinus rhythm  With no ST changes.

## 2012-12-26 NOTE — Patient Instructions (Addendum)
Your physician recommends that you schedule a follow-up appointment in: 8 WEEKS WITH DR Jens Som IN [place]  Your physician has recommended that you wear an event monitor. Event monitors are medical devices that record the heart's electrical activity. Doctors most often [place] these monitors to diagnose arrhythmias. Arrhythmias are problems with the speed or rhythm of the heartbeat. The monitor is a small, portable device. You can wear one while you do your normal daily activities. This is usually used to diagnose what is causing palpitations/syncope (passing out).

## 2012-12-27 ENCOUNTER — Ambulatory Visit (INDEPENDENT_AMBULATORY_CARE_PROVIDER_SITE_OTHER): Payer: BC Managed Care – PPO

## 2012-12-27 DIAGNOSIS — R002 Palpitations: Secondary | ICD-10-CM

## 2012-12-27 NOTE — Addendum Note (Signed)
Addended by: Yolonda Kida on: 12/27/2012 06:00 PM   Modules accepted: Orders

## 2012-12-27 NOTE — Progress Notes (Signed)
Enrolled patient with life watch for a event monitor to be mailed to her home on 12/27/12

## 2013-02-13 ENCOUNTER — Ambulatory Visit (INDEPENDENT_AMBULATORY_CARE_PROVIDER_SITE_OTHER): Payer: BC Managed Care – PPO | Admitting: Cardiology

## 2013-02-13 ENCOUNTER — Encounter: Payer: Self-pay | Admitting: Cardiology

## 2013-02-13 VITALS — BP 120/98 | HR 80 | Wt 128.0 lb

## 2013-02-13 DIAGNOSIS — R002 Palpitations: Secondary | ICD-10-CM

## 2013-02-13 DIAGNOSIS — I10 Essential (primary) hypertension: Secondary | ICD-10-CM

## 2013-02-13 NOTE — Assessment & Plan Note (Signed)
Patient with PVCs noted on monitor. She had a more sustained palpitations but this did not happen with a monitor in place. We discussed a beta blocker today but she feels as though her symptoms are not particularly bothersome. We will consider this in the future if her symptoms worsen. I have asked her to reduce her caffeine intake to see if this improves her symptoms as well.

## 2013-02-13 NOTE — Patient Instructions (Addendum)
Your physician recommends that you schedule a follow-up appointment in: AS NEEDED  

## 2013-02-13 NOTE — Progress Notes (Signed)
   HPI: 42 year old female I initially saw in Jan 2014 for evaluation of palpitations. Echocardiogram in December 2011 showed normal LV function. Laboratories in November 2013 showed normal thyroid functions, normal hemoglobin and normal potassium. CardioNet in January of 2014 revealed sinus to sinus tach with PVCs. Since I last saw her, she has had occasional palpitations but improved. She does not have dyspnea on exertion, orthopnea, pedal edema or exertional chest pain.   Current Outpatient Prescriptions  Medication Sig Dispense Refill  . etonogestrel-ethinyl estradiol (NUVARING) 0.12-0.015 MG/24HR vaginal ring Place 1 each vaginally every 28 (twenty-eight) days. Insert vaginally and leave in place for 3 consecutive weeks, then remove for 1 week.       . loratadine (CLARITIN) 10 MG tablet Take 10 mg by mouth daily.         No current facility-administered medications for this visit.     Past Medical History  Diagnosis Date  . Pre-eclampsia     with daughter  . GERD (gastroesophageal reflux disease)   . PVC (premature ventricular contraction)     Previous holter in Dec 2011 showed PVCs and 2 3 beat runs of SVT  . Hypertension   . IBS (irritable bowel syndrome)     History reviewed. No pertinent past surgical history.  History   Social History  . Marital Status: Married    Spouse Name: N/A    Number of Children: 1  . Years of Education: N/A   Occupational History  .  [employer]   Social History Main Topics  . Smoking status: Never Smoker   . Smokeless tobacco: Not on file  . Alcohol Use: 0.0 oz/week    1-2 drink(s) per week     Comment: per month  . Drug Use: No  . 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   Other Topics Concern  . Not on file   Social History Narrative  . No narrative on file    ROS: no fevers or chills, productive cough, hemoptysis, dysphasia, odynophagia,  melena, hematochezia, dysuria, hematuria, rash, seizure activity, orthopnea, PND, pedal edema, claudication. Remaining systems are negative.  Physical Exam: Well-developed well-nourished in no acute distress.  Skin is warm and dry.  HEENT is normal.  Neck is supple.  Chest is clear to auscultation with normal expansion.  Cardiovascular exam is regular rate and rhythm.  Abdominal exam nontender or distended. No masses palpated. Extremities show no edema. neuro grossly intact

## 2013-02-13 NOTE — Assessment & Plan Note (Signed)
Blood pressure is mildly elevated. She will track this at home and followup with her primary care physician. If he remains elevated and she requires an antihypertensive then a beta blocker may be appropriate as this would treat her blood pressure and her palpitations.

## 2013-08-05 ENCOUNTER — Ambulatory Visit (INDEPENDENT_AMBULATORY_CARE_PROVIDER_SITE_OTHER): Payer: BC Managed Care – PPO | Admitting: Family Medicine

## 2013-08-05 ENCOUNTER — Encounter: Payer: Self-pay | Admitting: Family Medicine

## 2013-08-05 VITALS — BP 148/96 | HR 81 | Wt 125.0 lb

## 2013-08-05 DIAGNOSIS — G43909 Migraine, unspecified, not intractable, without status migrainosus: Secondary | ICD-10-CM

## 2013-08-05 MED ORDER — KETOROLAC TROMETHAMINE 60 MG/2ML IM SOLN
60.0000 mg | Freq: Once | INTRAMUSCULAR | Status: AC
  Administered 2013-08-05: 60 mg via INTRAMUSCULAR

## 2013-08-05 NOTE — Progress Notes (Signed)
CC: Melinda Parsons is a 42 y.o. female is here for Sinusitis   Subjective: HPI:  Patient complains of a pounding headache that has been present for 5 days it comes and goes throughout the day described as moderate at the worst it is associated with photophobia and nausea which is present only when the headache is there, barely improved with ibuprofen, it was completely absent when she began running however afterwards pain returned nothing else seems to make better or worse. Denies worse headache of life, denies positional component to headache denies awakening from the headache denies any motor or sensory disturbances cognitive issues vision or hearing loss. No recent trauma. She's tried phenylephrine and Allegra without any change in headache. It is localized in the right temporal region sometimes on the crown of her head. She denies nasal congestion, sneezing, runny nose, facial pain, sore throat nor shortness of breath nor irregular heartbeat or chest pain. Denies tick bites rashes nor myalgias or joint pain  Review Of Systems Outlined In HPI  Past Medical History  Diagnosis Date  . Pre-eclampsia     with daughter  . GERD (gastroesophageal reflux disease)   . PVC (premature ventricular contraction)     Previous holter in Dec 2011 showed PVCs and 2 3 beat runs of SVT  . Hypertension   . IBS (irritable bowel syndrome)      Family History  Problem Relation Age of Onset  . Hypertension Mother   . Hyperlipidemia Mother   . Hypertension      grandmother  . Alcohol abuse      grandparent  . Diverticulitis Father      History  Substance Use Topics  . Smoking status: Never Smoker   . Smokeless tobacco: Not on file  . Alcohol Use: 0.0 oz/week    1-2 drink(s) per week     Comment: per month     Objective: Filed Vitals:   08/05/13 1130  BP: 148/96  Pulse: 81    General: Alert and Oriented, No Acute Distress HEENT: Pupils equal, round, reactive to light. Conjunctivae clear.   External ears unremarkable, canals clear with intact TMs with appropriate landmarks.  Middle ear appears open without effusion. Pink inferior turbinates.  Moist mucous membranes, pharynx without inflammation nor lesions.  Neck supple without palpable lymphadenopathy nor abnormal masses. Neuro: CN II-XII grossly intact, full strength/rom of all four extremities, C5/L4/S1 DTRs 2/4 bilaterally, gait normal,  Rhomberg normal. Lungs: Clear and comfortable work of breathing Cardiac: Regular rate and rhythm.  Extremities: No peripheral edema.  Strong peripheral pulses.  Mental Status: No depression, anxiety, nor agitation. Skin: Warm and dry.  Assessment & Plan: Melinda Parsons was seen today for sinusitis.  Diagnoses and associated orders for this visit:  Migraine    No red flags for need for urgent neuroimaging, possibly due to frontal sinusitis however would expect more upper respiratory complaints, we'll treat as common migraine she has declined steroids and nausea medication.   Return if symptoms worsen or fail to improve.

## 2013-08-05 NOTE — Addendum Note (Signed)
Addended by: Wyline Beady on: 08/05/2013 12:02 PM   Modules accepted: Orders

## 2013-08-06 ENCOUNTER — Encounter: Payer: Self-pay | Admitting: Family Medicine

## 2013-08-06 ENCOUNTER — Ambulatory Visit (INDEPENDENT_AMBULATORY_CARE_PROVIDER_SITE_OTHER): Payer: BC Managed Care – PPO | Admitting: Family Medicine

## 2013-08-06 VITALS — BP 142/93 | HR 72 | Wt 124.0 lb

## 2013-08-06 DIAGNOSIS — R51 Headache: Secondary | ICD-10-CM

## 2013-08-06 MED ORDER — PREDNISONE 20 MG PO TABS: ORAL_TABLET | ORAL | Status: AC

## 2013-08-06 NOTE — Progress Notes (Signed)
CC: Melinda Parsons is a 42 y.o. female is here for Migraine   Subjective: HPI:  Followup headache: Patient reports after Toradol yesterday afternoon headache is mildly improved until 4 PM completely absent. She woke this morning At her usual time awakening 5 AM noticed the headache had returned with some neck tightness. At that time headache is described as moderate in severity in the left temple pounding with mild nausea. Symptoms have waxed and waned except for neck stiffness which is now absent. No interventions today as of yet. Character has not changed it is dull and pounding is usually unilateral but does switch from right to left for no particular reason. The headache is nonradiating it is associated with ear fullness bilaterally without hearing loss tinnitus nor roaring sensation.  She reports the headache still behaves in a manner that is not related to positions is not improved with lying down or worsen when standing up, it is not the worse headache of her life, is not associated with confusion, coordination difficulty nor extremity weakness nor numbness. She does mention today while the headache was present she had spots in her field of vision which are typical and predictable with her prior migraines. Currently she denies fevers, photophobia, rashes, irregular heartbeat, chest pain, shortness of breath, mental disturbance, cough, nasal congestion nor ocular pain.    Review Of Systems Outlined In HPI  Past Medical History  Diagnosis Date  . Pre-eclampsia     with daughter  . GERD (gastroesophageal reflux disease)   . PVC (premature ventricular contraction)     Previous holter in Dec 2011 showed PVCs and 2 3 beat runs of SVT  . Hypertension   . IBS (irritable bowel syndrome)      Family History  Problem Relation Age of Onset  . Hypertension Mother   . Hyperlipidemia Mother   . Hypertension      grandmother  . Alcohol abuse      grandparent  . Diverticulitis Father       History  Substance Use Topics  . Smoking status: Never Smoker   . Smokeless tobacco: Not on file  . Alcohol Use: 0.0 oz/week    1-2 drink(s) per week     Comment: per month     Objective: Filed Vitals:   08/06/13 1309  BP: 142/93  Pulse: 72    General: Alert and Oriented, No Acute Distress HEENT: Pupils equal, round, reactive to light. Conjunctivae clear.  External ears unremarkable, canals clear with intact TMs with appropriate landmarks.  Middle ear appears open without effusion. Pink inferior turbinates.  Moist mucous membranes, pharynx without inflammation nor lesions.  Neck supple without palpable lymphadenopathy nor abnormal masses.  Lungs: Clear to auscultation bilaterally, no wheezing/ronchi/rales.  Comfortable work of breathing. Good air movement. Cardiac: Regular rate and rhythm. Normal S1/S2.  No murmurs, rubs, nor gallops.  No carotid bruits  Neuro: CN II-XII grossly intact, full strength/rom of all four extremities, C5/L4/S1 DTRs 2/4 bilaterally, gait normal, rapid alternating movements normal, heel-shin test normal, Rhomberg normal. Extremities: No peripheral edema.  Strong peripheral pulses.  Mental Status: No depression, anxiety, nor agitation. Skin: Warm and dry.  Assessment & Plan: Melinda Parsons was seen today for migraine.  Diagnoses and associated orders for this visit:  Headache(784.0) - predniSONE (DELTASONE) 20 MG tablet; Three tabs daily days 1-3, two tabs daily days 4-6, one tab daily days 7-9, half tab daily days 10-13.    Discussed with patient lack of red flags still makes me feel  neuroimaging is not warranted at this time. I am suspicious of sinusitis with unresolved typical common migraine by process of elimination either of which should improve with prednisone taper, we discussed red flags that would require emergent/urgent neuroimaging and evaluation, she declines Toradol at this time citing no symptoms while in our clinic.  I've asked her to update me  on Thursday with her status.  25 minutes spent face-to-face during visit today of which at least 50% was counseling or coordinating care regarding headache.   Return if symptoms worsen or fail to improve.

## 2013-09-23 ENCOUNTER — Encounter: Payer: Self-pay | Admitting: Family Medicine

## 2013-09-23 ENCOUNTER — Ambulatory Visit (INDEPENDENT_AMBULATORY_CARE_PROVIDER_SITE_OTHER): Payer: BC Managed Care – PPO | Admitting: Family Medicine

## 2013-09-23 VITALS — BP 116/77 | HR 104 | Wt 124.0 lb

## 2013-09-23 DIAGNOSIS — M25571 Pain in right ankle and joints of right foot: Secondary | ICD-10-CM

## 2013-09-23 DIAGNOSIS — M25579 Pain in unspecified ankle and joints of unspecified foot: Secondary | ICD-10-CM

## 2013-09-23 DIAGNOSIS — Z23 Encounter for immunization: Secondary | ICD-10-CM

## 2013-09-23 DIAGNOSIS — L84 Corns and callosities: Secondary | ICD-10-CM

## 2013-09-23 NOTE — Progress Notes (Signed)
  Subjective:    Patient ID: Melinda Parsons, female    DOB: 1971-10-28, 42 y.o.   MRN: [id number]  HPI Right foot pain x 2 wks, callus on 4th toe x 1 month.  She has been walking/running for exercise and meds daily..  This is where her pain is.  Will burn at time, worse during exercise.  She's noticed it actually been a little bit swollen a few times. No known trauma or injury. She has actually been fitted for her shoes and actually was applied and feels like she has plenty of room in her toe box.    Review of Systems     Objective:   Physical Exam  Constitutional: She appears well-developed and well-nourished.  HENT:  Head: Normocephalic and atraumatic.  Skin: Skin is warm and dry.  He has 2: On the outside of the fourth toe on the right foot. The areas were debrided and she did have blood vessels on and underneath.  Psychiatric: She has a normal mood and affect. Her behavior is normal.          Assessment & Plan:  Corn/callus on the outer portion of the fourth toe on the right foot-discussed treatment options. Corns were deep-rooted with a scalpel and cryotherapy performed. If they're not resolving over the next 2-3 weeks then please return for repeat treatment. Handout given. Call if any problems or side effects or concerns. She can also try over-the-counter corn remover if she would like to.

## 2013-09-23 NOTE — Patient Instructions (Signed)
Corns and Calluses A thickening of the skin layer (usually over bony areas, such as toe joints) is known as a corn. Two types of corns exist: hard corns and soft corns. Calluses are painless areas of skin thickening that are caused by repeated pressure or irritation. Corns tend to affect toe joints and the skin between the toes; whereas, a callus can appear on any part of the body (especially the hands, feet, or knees).  SYMPTOMS   Corn:  Presence of a small (1/8 to 3/8 inch [3 to 10 mm in diameter]), painful bump on the side or over the joint of a toe.  Hard corns are more common on the outer portion of the little (fifth) toe at the joint.  Soft corns are more common between bony bumps (prominences), usually between the fourth and fifth toes or between the second and third toes.  Callus:  A rough, thickened area of skin that appears after repeated pressure or irritation. CAUSES  The purpose of corns and calluses is to protect an area of skin from injury caused by repeated irritation (rubbing or squeezing). The presence of pressure causes the skin cells to grow at a faster rate than the cells of unaffected areas. This leads to an overgrowth (corn or callus). As apposed to hard corns, soft corns tend to develop between toes, because there is more moisture. Soft corns are often the result of prolonged shoe wear, which leads to increased perspiration and moisture.  RISK INCREASES WITH:  Shoes that are too tight.  Occupations or sports that involve repetitive pressure on the hands (racquetball and baseball) or sudden stops on hard surfaces (track and tennis).  Sports that require the athlete to wear shoes, perspire, or wear clothing or protective gear that causes the production of heat and friction. PREVENTION  Properly fitted shoes and equipment.  Modify activities to prevent constant pressure on specific areas of skin.  If possible, wear padding over areas of skin that are exposed to  repeated pressure or irritation.  Keep the area between the toes dry (with powder or by removing shoes often).  Relieve shoe pressure by stretching the areas of the shoe that cause the pressure and or use ointments to soften leather shoes. PROGNOSIS  Corns and calluses typically subside if the activity that causes them is eliminated. Recovery may take up to 3 weeks. Recurrence is likely even with treatment if the cause is not removed.  RELATED COMPLICATIONS  If one overcompensates in an attempt to avoid pain, he or she may experience pain in other areas due to the changes in body movements (mechanics). TREATMENT  The best way to treat corns and calluses is to remove the source of pressure. Corn and callus pads may be helpful in reducing pressure on the affected skin. For soft corns, try to keep the affected area dry. If you cannot find shoes that fit properly, a shoe repair shop may be able to alter your shoes to reduce pressure. Occasionally a cushion for the bottom of the foot (metatarsal bar) worn within the shoe may relieve pressure on corns or calluses of the foot. For calluses, you may be able to peel or rub the thickened area with a pumice stone, sandstone, callus file, or with sandpaper to remove the callus; wetting the affected area may make this process more effective. Do not cut the corn or callus with a razor or knife. If the corn or callus must be removed, then a medically trained person should perform   the procedure. After peeling away the upper layers of a corn once or twice a day, it may be recommended to apply a non-prescription 5% to 10% salicylic ointment and cover the area with a bandage. It very uncommon to have the bony bumps (at toe joints) surgically removed. MEDICATION   If pain medication is necessary, nonsteroidal anti-inflammatory medications, such as aspirin and ibuprofen, or other minor pain relievers, such as acetaminophen, are often recommended. Contact your caregiver  immediately if any bleeding, stomach upset, or signs of an allergic reaction occur.  Topical salicylic ointments (5% to 10%) may be of benefit.  Prescription pain medications may be given by a caregiver. Use only as directed and only as much as you need.  Soak the foot for 20 minutes, twice a day, in a gallon of warm water. This may help to soften corns and calluses. Care should be taken to thoroughly dry the foot, especially between the toes, after soaking. SEEK MEDICAL CARE IF:   Symptoms get worse or do not improve in 2 weeks despite treatment.  Any signs of infection develop, including redness, swelling, increased pain or tenderness, or increased warmth around the corn or callus.  New, unexplained symptoms develop (drugs used in treatment may produce side effects). Document Released: 11/28/2005 Document Revised: 02/20/2012 Document Reviewed: 03/12/2009 [employer] Patient Information 2014 [employer].  

## 2014-02-04 ENCOUNTER — Ambulatory Visit: Payer: BC Managed Care – PPO | Admitting: Physician Assistant

## 2014-02-05 ENCOUNTER — Encounter: Payer: Self-pay | Admitting: Physician Assistant

## 2014-02-05 ENCOUNTER — Ambulatory Visit (INDEPENDENT_AMBULATORY_CARE_PROVIDER_SITE_OTHER): Payer: BC Managed Care – PPO | Admitting: Physician Assistant

## 2014-02-05 VITALS — BP 138/89 | HR 110 | Wt 125.0 lb

## 2014-02-05 DIAGNOSIS — R0981 Nasal congestion: Secondary | ICD-10-CM

## 2014-02-05 DIAGNOSIS — J3489 Other specified disorders of nose and nasal sinuses: Secondary | ICD-10-CM

## 2014-02-05 DIAGNOSIS — R21 Rash and other nonspecific skin eruption: Secondary | ICD-10-CM

## 2014-02-05 DIAGNOSIS — J309 Allergic rhinitis, unspecified: Secondary | ICD-10-CM

## 2014-02-05 DIAGNOSIS — L84 Corns and callosities: Secondary | ICD-10-CM

## 2014-02-05 MED ORDER — CLOTRIMAZOLE 1 % EX CREA: 1.0000 "application " | TOPICAL_CREAM | Freq: Two times a day (BID) | CUTANEOUS | Status: DC

## 2014-02-05 MED ORDER — FLUTICASONE PROPIONATE 50 MCG/ACT NA SUSP: 2.0000 | Freq: Every day | NASAL | Status: DC

## 2014-02-05 NOTE — Progress Notes (Signed)
Subjective:    Patient ID: Melinda Parsons, female    DOB: 03/23/1971, 43 y.o.   MRN: [id number]  HPI Patient presents to the clinic with a rash on her abdomen for the last month. She felt like it could be eczema and used her husband's eczema cream has not made it better or worse. She does still in some days it is more inflamed than others. It is painful and itchy. She is using vena moisturizers, baby loves and Cetaphil. Nothing seems to make better. She denies any lesions or draining. She's never had anything like this before. Does not seem to be anywhere other than her abdomen and.  She also complains of ongoing chronic sinus pressure and congestion. This has been going on for multiple months. She wakes up every morning feeling very congested. She has on and off ear pain, on and off sore throat and on and off headache. She takes Allegra daily as well as pseudoephedrine. She wonders if there's anything else to do.  She also follows up on her corn of her fourth metatarsal of right foot. Patient is a runner. Dr. Glade LloydMatheny had previously scraped down and frozen which resolved for a time. She reports it to be very painful. She has not tried anything to make away.    Review of Systems     Objective:   Physical Exam  Constitutional: She is oriented to person, place, and time. She appears well-developed and well-nourished.  HENT:  Head: Normocephalic and atraumatic.  Right Ear: External ear normal.  Left Ear: External ear normal.  Mouth/Throat: Oropharynx is clear and moist.  TMs clear bilaterally.  Eyes: Conjunctivae are normal.  Neck: Normal range of motion. Neck supple.  Cardiovascular: Normal rate, regular rhythm and normal heart sounds.   Pulmonary/Chest: Effort normal and breath sounds normal. She has no wheezes.  Lymphadenopathy:    She has no cervical adenopathy.  Neurological: She is alert and oriented to person, place, and time.  Skin: Skin is dry.  Corn of fourth metatarsal lateral  side of right foot. Proximally 3 MM by 3MM.used scapula to deride hyperkerotic tissue then used cryotherapy   Psychiatric: She has a normal mood and affect. Her behavior is normal.          Assessment & Plan:  Rash -unclear today the exact etiology. Based on appearance seems to be like eczema however it did not respond to steroid cream of husband's. Concerned there could be a fungal element. We'll be KOH today and sent over antifungal to try on spots. If not working and we will just have to go with a stronger steroid and see if there's any improvement. Discussed conservative measures to help if eczema.   Allergic rhinitis/chronic sinus pressure- went ahead and sent over a nasal spray to try daily in combination with Sudafed. I like for patient to stop Allegra and start Zyrtec or Claritin. I think maybe the change could potentially help symptoms. Discuss with patient the home remedy that I heard could work. Which is dated quick care and daily. Instructed patient to look into this. Continue to followup as needed.   Corn of toe -I. did use a scalpel to scraped down hyperkerotic tissues and used cryotherapy to hopefully get rid of lesion all together. Instructed patient if not improving may need to send to podiatry to excise. When running asked her to use corn pad where her feet rub together.   Cryotherapy Procedure Note  Pre-operative Diagnosis: corn  Post-operative Diagnosis: corn  Locations: 4th metatarsal right foot lateral toe adjacent to 5th metatarsal.  Indications: pain  Anesthesia:none  Procedure Details  History of allergy to iodine: no. Pacemaker? no.  Patient informed of risks (permanent scarring, infection, light or dark discoloration, bleeding, infection, weakness, numbness and recurrence of the lesion) and benefits of the procedure and verbal informed consent obtained.  The areas are treated with liquid nitrogen therapy, frozen until ice ball extended 2 mm beyond lesion,  allowed to thaw, and treated again. The patient tolerated procedure well.  The patient was instructed on post-op care, warned that there may be blister formation, redness and pain. Recommend OTC analgesia as needed for pain.  Condition: Stable  Complications: pain.  Plan: 1. Instructed to keep the area dry and covered for 24-48h and clean thereafter. 2. Warning signs of infection were reviewed.   3. Recommended that the patient use OTC acetaminophen as needed for pain.

## 2014-02-05 NOTE — Patient Instructions (Signed)
Try beta glucan for allergies.  Try a different anti-histamine.  Try flonase daily.

## 2014-02-06 LAB — KOH PREP: RESULT - KOH: NONE SEEN

## 2014-02-07 ENCOUNTER — Other Ambulatory Visit: Payer: Self-pay | Admitting: Physician Assistant

## 2014-02-07 DIAGNOSIS — B070 Plantar wart: Secondary | ICD-10-CM | POA: Insufficient documentation

## 2014-02-07 MED ORDER — CLOBETASOL PROPIONATE 0.05 % EX CREA: 1.0000 "application " | TOPICAL_CREAM | Freq: Two times a day (BID) | CUTANEOUS | Status: DC

## 2014-02-18 ENCOUNTER — Other Ambulatory Visit: Payer: Self-pay | Admitting: Physician Assistant

## 2014-02-18 ENCOUNTER — Telehealth: Payer: Self-pay | Admitting: *Deleted

## 2014-02-18 DIAGNOSIS — L84 Corns and callosities: Secondary | ICD-10-CM

## 2014-02-18 NOTE — Telephone Encounter (Signed)
Notifeid patient of PA advice.  Pt going to [facility] at the end of the month and would like to be seen. Barry DienesKimberly Gordon, LPN

## 2014-02-18 NOTE — Telephone Encounter (Signed)
Yes can send for podiatry referral. Keep covered and clean. If draining fluids or not healing more than welcome to make appt.

## 2014-02-18 NOTE — Telephone Encounter (Signed)
Pt calls and states that you removed a vorn off her right foot and she states its not really any better it has now split open and on the left foot she is noticing after activity that once she sits down she has some bruising around her toes.  Pt states at last visit you mentioned her seeing a podiatrist.  Informed patient that I was concerned about infection with the right foot where the corn was removed and podiatrist referral may be as long as a month to get into see one and thought maybe she should schedule appt to see you in the mean time.  What do you think?

## 2014-02-21 ENCOUNTER — Ambulatory Visit: Payer: BC Managed Care – PPO | Admitting: Physician Assistant

## 2014-04-11 ENCOUNTER — Encounter: Payer: Self-pay | Admitting: Sports Medicine

## 2014-04-11 ENCOUNTER — Ambulatory Visit (INDEPENDENT_AMBULATORY_CARE_PROVIDER_SITE_OTHER): Payer: BC Managed Care – PPO | Admitting: Sports Medicine

## 2014-04-11 VITALS — BP 105/68 | HR 87 | Ht 63.0 in | Wt 125.0 lb

## 2014-04-11 DIAGNOSIS — M722 Plantar fascial fibromatosis: Secondary | ICD-10-CM

## 2014-04-11 MED ORDER — MELOXICAM 15 MG PO TABS: ORAL_TABLET | ORAL | Status: DC

## 2014-04-11 NOTE — Progress Notes (Signed)
   Subjective:    I'm seeing this patient as a consultation for:  Dr. Linford ArnoldMetheney  CC: Bilateral foot pain  HPI: This is a very pleasant 43 year old female, she comes in with a several month history of pain which localizes in the arches of both feet. She has tried an occasional ibuprofen which is only minimally effective. She was referred to a podiatrist who placed a pad in her shoe, and advised no activity for 6 months. She was unhappy with this and was sent here for second opinion. Pain is severe, persistent, worse throughout the day, not worse in the morning, and not located at the calcaneal insertion of the plntar fascia.  Past medical history, Surgical history, Family history not pertinant except as noted below, Social history, Allergies, and medications have been entered into the medical record, reviewed, and no changes needed.   Review of Systems: No headache, visual changes, nausea, vomiting, diarrhea, constipation, dizziness, abdominal pain, skin rash, fevers, chills, night sweats, weight loss, swollen lymph nodes, body aches, joint swelling, muscle aches, chest pain, shortness of breath, mood changes, visual or auditory hallucinations.   Objective:   General: Well Developed, well nourished, and in no acute distress.  Neuro/Psych: Alert and oriented x3, extra-ocular muscles intact, able to move all 4 extremities, sensation grossly intact. Skin: Warm and dry, no rashes noted.  Respiratory: Not using accessory muscles, speaking in full sentences, trachea midline.  Cardiovascular: Pulses palpable, no extremity edema. Abdomen: Does not appear distended. Bilateral feet: No visible erythema or swelling. Noted a couple of corns on the bottom of her feet that are tender. Range of motion is full in all directions. Strength is 5/5 in all directions. No hallux valgus. Bilateral flexible pes cavus. No abnormal callus noted. No pain over the navicular prominence, or base of fifth  metatarsal. No tenderness at the calcaneal portion of the plantar fascia, she does have irregularities along her plantar fascia itself suggestive of plantar fasciitis fibromatosis. No pain at the Achilles insertion. No pain over the calcaneal bursa. No pain of the retrocalcaneal bursa. No tenderness to palpation over the tarsals, metatarsals, or phalanges. No hallux rigidus or limitus. No tenderness palpation over interphalangeal joints. No pain with compression of the metatarsal heads. Neurovascularly intact distally.  Impression and Recommendations:   This case required medical decision making of moderate complexity.

## 2014-04-11 NOTE — Assessment & Plan Note (Signed)
Mobic, home rehabilitation. Return for custom orthotics. If no better tomorrow, we can consider injection around the mid plantar fascia. May continue physical activity as tolerated.

## 2014-04-16 ENCOUNTER — Ambulatory Visit (INDEPENDENT_AMBULATORY_CARE_PROVIDER_SITE_OTHER): Payer: BC Managed Care – PPO | Admitting: Sports Medicine

## 2014-04-16 ENCOUNTER — Encounter: Payer: Self-pay | Admitting: Sports Medicine

## 2014-04-16 VITALS — BP 121/79 | HR 93 | Ht 63.0 in | Wt 126.0 lb

## 2014-04-16 DIAGNOSIS — M722 Plantar fascial fibromatosis: Secondary | ICD-10-CM

## 2014-04-16 NOTE — Progress Notes (Signed)
    Patient was fitted for a : standard, cushioned, semi-rigid orthotic. The orthotic was heated and afterward the patient stood on the orthotic blank positioned on the orthotic stand. The patient was positioned in subtalar neutral position and 10 degrees of ankle dorsiflexion in a weight bearing stance. After completion of molding, a stable base was applied to the orthotic blank. The blank was ground to a stable position for weight bearing. Size: 9 Base: Blue EVA Additional Posting and Padding: None The patient ambulated these, and they were very comfortable.  I spent 40 minutes with this patient, greater than 50% was face-to-face time counseling regarding the below diagnosis.   

## 2014-04-16 NOTE — Assessment & Plan Note (Signed)
Orthotics as above. Return one month. Consider the plantar fascia injection if no better however symptoms have almost completely resolved.

## 2014-05-02 ENCOUNTER — Encounter: Payer: Self-pay | Admitting: Cardiovascular Disease

## 2014-05-14 ENCOUNTER — Ambulatory Visit (INDEPENDENT_AMBULATORY_CARE_PROVIDER_SITE_OTHER): Payer: BC Managed Care – PPO | Admitting: Sports Medicine

## 2014-05-14 ENCOUNTER — Encounter: Payer: Self-pay | Admitting: Sports Medicine

## 2014-05-14 VITALS — BP 119/79 | HR 97 | Wt 127.0 lb

## 2014-05-14 DIAGNOSIS — M722 Plantar fascial fibromatosis: Secondary | ICD-10-CM

## 2014-05-14 DIAGNOSIS — L84 Corns and callosities: Secondary | ICD-10-CM

## 2014-05-14 NOTE — Assessment & Plan Note (Signed)
Symptoms are resolved with orthotics. She is having some first MTP and interphalangeal joint pain. First metatarsal ray post placed. Return asked him if no better we can consider first MTP and interphalangeal joint injections.

## 2014-05-14 NOTE — Assessment & Plan Note (Signed)
I can core this out at the 2 week visit if still symptomatic. This is on the heel.

## 2014-05-14 NOTE — Progress Notes (Signed)
  Subjective:    CC:  Followup  HPI: Plantar fasciitis: Resolved with custom orthotics and rehabilitation.  Corn of heel: Currently applying a topical treatment, improving.  Toe pain: Localized at the left first metatarsophalangeal joint and interphalangeal joint with mild swelling. Moderate, persistent.  Past medical history, Surgical history, Family history not pertinant except as noted below, Social history, Allergies, and medications have been entered into the medical record, reviewed, and no changes needed.   Review of Systems: No fevers, chills, night sweats, weight loss, chest pain, or shortness of breath.   Objective:    General: Well Developed, well nourished, and in no acute distress.  Neuro: Alert and oriented x3, extra-ocular muscles intact, sensation grossly intact.  HEENT: Normocephalic, atraumatic, pupils equal round reactive to light, neck supple, no masses, no lymphadenopathy, thyroid nonpalpable.  Skin: Warm and dry, no rashes. Cardiac: Regular rate and rhythm, no murmurs rubs or gallops, no lower extremity edema.  Respiratory: Clear to auscultation bilaterally. Not using accessory muscles, speaking in full sentences. Foot: No visible erythema or swelling. Range of motion is full in all directions. Strength is 5/5 in all directions. No hallux valgus. No pes cavus or pes planus. No abnormal callus noted. No pain over the navicular prominence, or base of fifth metatarsal. No tenderness to palpation of the calcaneal insertion of plantar fascia. No pain at the Achilles insertion. No pain over the calcaneal bursa. No pain of the retrocalcaneal bursa. No tenderness to palpation over the tarsals, metatarsals, or phalanges. No hallux rigidus or limitus. Tender to palpation over the first metatarsal phalangeal and interphalangeal joint. No pain with compression of the metatarsal heads. Neurovascularly intact distally.  Impression and Recommendations:

## 2014-05-28 ENCOUNTER — Encounter: Payer: Self-pay | Admitting: Sports Medicine

## 2014-05-28 ENCOUNTER — Ambulatory Visit (INDEPENDENT_AMBULATORY_CARE_PROVIDER_SITE_OTHER): Payer: BC Managed Care – PPO | Admitting: Sports Medicine

## 2014-05-28 VITALS — BP 123/88 | HR 90 | Ht 63.5 in | Wt 125.0 lb

## 2014-05-28 DIAGNOSIS — M79674 Pain in right toe(s): Secondary | ICD-10-CM | POA: Insufficient documentation

## 2014-05-28 DIAGNOSIS — L84 Corns and callosities: Secondary | ICD-10-CM

## 2014-05-28 DIAGNOSIS — M79609 Pain in unspecified limb: Secondary | ICD-10-CM

## 2014-05-28 NOTE — Progress Notes (Addendum)
  Subjective:    CC: Follow up  HPI: Right great toe pain: Localized at the metatarsophalangeal joint, moderate, persistent with swelling. Persistent despite custom orthotics with first metatarsal ray posting and NSAIDs.  Corn:  Localized under the great toe on the right, heel on the right, and heel on the left. Has persisted despite topical acid treatments and cryotherapy.  Past medical history, Surgical history, Family history not pertinant except as noted below, Social history, Allergies, and medications have been entered into the medical record, reviewed, and no changes needed.   Review of Systems: No fevers, chills, night sweats, weight loss, chest pain, or shortness of breath.   Objective:    General: Well Developed, well nourished, and in no acute distress.  Neuro: Alert and oriented x3, extra-ocular muscles intact, sensation grossly intact.  HEENT: Normocephalic, atraumatic, pupils equal round reactive to light, neck supple, no masses, no lymphadenopathy, thyroid nonpalpable.  Skin: Warm and dry, no rashes. Cardiac: Regular rate and rhythm, no murmurs rubs or gallops, no lower extremity edema.  Respiratory: Clear to auscultation bilaterally. Not using accessory muscles, speaking in full sentences.  Procedure: Real-time Ultrasound Guided Injection of right first metatarsophalangeal joint Device: GE Logiq E  Verbal informed consent obtained.  Time-out conducted.  Noted no overlying erythema, induration, or other signs of local infection.  Skin prepped in a sterile fashion.  Local anesthesia: Topical Ethyl chloride.  With sterile technique and under real time ultrasound guidance:  Noted copious synovitis and effusion on ultrasound, needle advanced into joint, 0.5 cc Kenalog 40, 0.5 cc lidocaine injected easily. Completed without difficulty  Pain immediately resolved suggesting accurate placement of the medication.  Advised to call if fevers/chills, erythema, induration, drainage,  or persistent bleeding.  Images permanently stored and available for review in the ultrasound unit.  Impression: Technically successful ultrasound guided injection.  I then performed trimming of 4 hyperkeratotic skin lesions on the feet.  Impression and Recommendations:    I spent 40 minutes with this patient, greater than 50% was face to face time counseling regarding the above diagnoses.

## 2014-05-28 NOTE — Assessment & Plan Note (Signed)
Improved but insufficiently so with first metatarsal ray posting in her orthotics. Right first metatarsophalangeal joint injection as above. Return in a month.

## 2014-05-28 NOTE — Assessment & Plan Note (Signed)
Shave performed on 4 corns as well as first ray callus.

## 2014-06-11 ENCOUNTER — Encounter: Payer: Self-pay | Admitting: Sports Medicine

## 2014-06-11 ENCOUNTER — Ambulatory Visit (INDEPENDENT_AMBULATORY_CARE_PROVIDER_SITE_OTHER): Payer: BC Managed Care – PPO | Admitting: Sports Medicine

## 2014-06-11 VITALS — BP 140/97 | HR 81 | Ht 63.0 in | Wt 124.0 lb

## 2014-06-11 DIAGNOSIS — M5416 Radiculopathy, lumbar region: Secondary | ICD-10-CM | POA: Insufficient documentation

## 2014-06-11 DIAGNOSIS — R252 Cramp and spasm: Secondary | ICD-10-CM

## 2014-06-11 DIAGNOSIS — M25579 Pain in unspecified ankle and joints of unspecified foot: Secondary | ICD-10-CM

## 2014-06-11 DIAGNOSIS — M79674 Pain in right toe(s): Secondary | ICD-10-CM

## 2014-06-11 MED ORDER — MAGNESIUM OXIDE 400 MG PO TABS: 800.0000 mg | ORAL_TABLET | Freq: Every day | ORAL | Status: DC

## 2014-06-11 NOTE — Assessment & Plan Note (Addendum)
Did extremely well after first metatarsophalangeal joint injection with complete resolution of pain at this joint. At the last visit she also had some pain at the interphalangeal joint, I told her if she continued to have pain we would inject this as well. Right first interphalangeal joint injection as above. Return in one month.

## 2014-06-11 NOTE — Assessment & Plan Note (Signed)
It is possible that these do represent a radicular etiology. Magnesium oxide 800 mg at bedtime, may increase to twice a day if needed. Return one month. If continues to have cramps we will further investigate the lumbar spine.

## 2014-06-11 NOTE — Progress Notes (Signed)
  Subjective:    CC: Followup  HPI: This is a very pleasant 43 year old female, I saw her 2 weeks ago with significant pain at the right metatarsophalangeal joint of the first ray. She did not respond to custom orthotics with first metatarsal ray posting, so we injected her first metatarsophalangeal joint under ultrasound guidance. At that visit she also had some pain at the interphalangeal joint, today her metatarsophalangeal joint is completely pain-free, unfortunately she still has some pain at the further distal interphalangeal joint. She does desire repeat interventional treatment here. Pain is mild, persistent.  Past medical history, Surgical history, Family history not pertinant except as noted below, Social history, Allergies, and medications have been entered into the medical record, reviewed, and no changes needed.   Review of Systems: No fevers, chills, night sweats, weight loss, chest pain, or shortness of breath.   Objective:    General: Well Developed, well nourished, and in no acute distress.  Neuro: Alert and oriented x3, extra-ocular muscles intact, sensation grossly intact.  HEENT: Normocephalic, atraumatic, pupils equal round reactive to light, neck supple, no masses, no lymphadenopathy, thyroid nonpalpable.  Skin: Warm and dry, no rashes. Cardiac: Regular rate and rhythm, no murmurs rubs or gallops, no lower extremity edema.  Respiratory: Clear to auscultation bilaterally. Not using accessory muscles, speaking in full sentences. Right foot: No tenderness to palpation at the first metatarsophalangeal joint, she does have some tenderness at the first interphalangeal joint.  Procedure: Real-time Ultrasound Guided Injection of right first interphalangeal joint Device: GE Logiq E  Verbal informed consent obtained.  Time-out conducted.  Noted no overlying erythema, induration, or other signs of local infection.  Skin prepped in a sterile fashion.  Local anesthesia: Topical  Ethyl chloride.  With sterile technique and under real time ultrasound guidance:  25-gauge needle advanced into the joint under ultrasound guidance, 0.5 cc Kenalog 40, 0.5 cc lidocaine injected easily. Completed without difficulty  Pain immediately resolved suggesting accurate placement of the medication.  Advised to call if fevers/chills, erythema, induration, drainage, or persistent bleeding.  Images permanently stored and available for review in the ultrasound unit.  Impression: Technically successful ultrasound guided injection.  Impression and Recommendations:

## 2014-06-12 ENCOUNTER — Telehealth: Payer: Self-pay | Admitting: *Deleted

## 2014-06-12 NOTE — Telephone Encounter (Signed)
Voice mail was left Krysteena and forJasmine December her also to call back if she had any questions. Corliss SkainsJamie Rhone Ozaki, CMA

## 2014-06-12 NOTE — Telephone Encounter (Signed)
Melinda Parsons called this morning with 2 concerns.  She said that her pain/discomfort in right toe has not changed since the injection yesterday. She denies redness or swelling but refers to it as "feels tight" but the pain is still there. She said that after the injection she had a sudden onset of headache that resolved this morning with tylenol. Her other concern was with the magnesium prescribed. She said that she only took 1 capsule last night and when lying in bed she said that her heart was racing. She denied chest pain and SOB. I told her not to take magnesium until I got back in touch with her. Please advise. Corliss SkainsJamie Shell Yandow, CMA

## 2014-06-12 NOTE — Telephone Encounter (Signed)
Thank you for letting me know. Give the injection several days to start working, I did put a large amount of medication in, this irritates the joint significantly so some pain today after injection is not unexpected. Also magnesium does not cause tachycardia as a side effect, in fact it may decrease the heart rate which is not dangerous. Continue magnesium as prescribed.

## 2014-06-27 ENCOUNTER — Ambulatory Visit: Payer: BC Managed Care – PPO | Admitting: Sports Medicine

## 2014-07-04 ENCOUNTER — Telehealth: Payer: Self-pay

## 2014-07-04 MED ORDER — PREDNISONE 50 MG PO TABS: ORAL_TABLET | ORAL | Status: DC

## 2014-07-04 NOTE — Telephone Encounter (Signed)
Pt informed and has an appointment for August 4./Katrena Stehlin,CMA

## 2014-07-04 NOTE — Telephone Encounter (Signed)
Melinda BlonderDenise called this am and stated that she has been having muscle cramps during the night on her left foot starting from the top of her ankle and radiating down her foot to her big toe. She also states that her right foot/big toe is somewhat better since taking the magnesium but is still painful on the top and sides. She stated that she cant wear regular shoes or shoes with heals and its is hard for her to do any activities without pain. She stated that the injection did help but the pain is "really bad" on the top of her big toe and along the side of her big toe. She is taking the magnesium at night and the Mobic every morning. Please advise./Hawke Villalpando,CMA

## 2014-07-04 NOTE — Telephone Encounter (Signed)
Burst of prednisone called in, she needs to return to see me so that we can determine if this is coming from her foot or her lumbar spine.

## 2014-07-15 ENCOUNTER — Encounter: Payer: Self-pay | Admitting: Sports Medicine

## 2014-07-15 ENCOUNTER — Ambulatory Visit (INDEPENDENT_AMBULATORY_CARE_PROVIDER_SITE_OTHER): Payer: BC Managed Care – PPO | Admitting: Sports Medicine

## 2014-07-15 ENCOUNTER — Telehealth: Payer: Self-pay | Admitting: *Deleted

## 2014-07-15 VITALS — BP 133/84 | HR 82 | Ht 63.5 in | Wt 123.0 lb

## 2014-07-15 DIAGNOSIS — IMO0002 Reserved for concepts with insufficient information to code with codable children: Secondary | ICD-10-CM

## 2014-07-15 DIAGNOSIS — M5416 Radiculopathy, lumbar region: Secondary | ICD-10-CM

## 2014-07-15 DIAGNOSIS — B070 Plantar wart: Secondary | ICD-10-CM

## 2014-07-15 MED ORDER — MELOXICAM 15 MG PO TABS: ORAL_TABLET | ORAL | Status: DC

## 2014-07-15 MED ORDER — IMIQUIMOD 5 % EX CREA: TOPICAL_CREAM | CUTANEOUS | Status: DC

## 2014-07-15 NOTE — Progress Notes (Signed)
  Subjective:    CC: Followup  HPI: Plantar: Recurrent, desires repeat cryotherapy.  Left lumbar radiculitis: Left-sided L5, cramping at night improved with magnesium, symptoms are highly discogenic with pain in the back with flexion and Valsalva as well as driving a car, she did have a fantastic response to prednisone. Symptoms are moderate, persistent.  Past medical history, Surgical history, Family history not pertinant except as noted below, Social history, Allergies, and medications have been entered into the medical record, reviewed, and no changes needed.   Review of Systems: No fevers, chills, night sweats, weight loss, chest pain, or shortness of breath.   Objective:    General: Well Developed, well nourished, and in no acute distress.  Neuro: Alert and oriented x3, extra-ocular muscles intact, sensation grossly intact.  HEENT: Normocephalic, atraumatic, pupils equal round reactive to light, neck supple, no masses, no lymphadenopathy, thyroid nonpalpable.  Skin: Warm and dry, no rashes. Cardiac: Regular rate and rhythm, no murmurs rubs or gallops, no lower extremity edema.  Respiratory: Clear to auscultation bilaterally. Not using accessory muscles, speaking in full sentences.  Procedure:  Cryodestruction of 3 right-sided plantar warts Consent obtained and verified. Time-out conducted. Noted no overlying erythema, induration, or other signs of local infection. Completed without difficulty using Cryo-Gun. Advised to call if fevers/chills, erythema, induration, drainage, or persistent bleeding.   Impression and Recommendations:

## 2014-07-15 NOTE — Assessment & Plan Note (Signed)
Likely left L5, cramping improved with magnesium at night. Symptoms are highly discogenic. She did feel fantastic on prednisone. Home rehabilitation exercises, MRI, return to see me to go over results. This will likely lead to interventional injection

## 2014-07-15 NOTE — Assessment & Plan Note (Signed)
Cryotherapy of 3 plantar warts as above. Adding Aldara.

## 2014-07-15 NOTE — Telephone Encounter (Signed)
[facility] by [facility] [id number] valid 8/4-10/2/15. Corliss SkainsJamie Leyana Whidden, CMA

## 2014-07-16 ENCOUNTER — Ambulatory Visit (INDEPENDENT_AMBULATORY_CARE_PROVIDER_SITE_OTHER): Payer: BC Managed Care – PPO

## 2014-07-16 DIAGNOSIS — M5416 Radiculopathy, lumbar region: Secondary | ICD-10-CM

## 2014-07-16 DIAGNOSIS — M519 Unspecified thoracic, thoracolumbar and lumbosacral intervertebral disc disorder: Secondary | ICD-10-CM

## 2014-07-16 DIAGNOSIS — M5137 Other intervertebral disc degeneration, lumbosacral region: Secondary | ICD-10-CM

## 2014-07-18 ENCOUNTER — Ambulatory Visit: Payer: BC Managed Care – PPO | Admitting: Sports Medicine

## 2014-07-18 DIAGNOSIS — Z0289 Encounter for other administrative examinations: Secondary | ICD-10-CM

## 2014-07-22 ENCOUNTER — Ambulatory Visit (INDEPENDENT_AMBULATORY_CARE_PROVIDER_SITE_OTHER): Payer: BC Managed Care – PPO | Admitting: Sports Medicine

## 2014-07-22 ENCOUNTER — Encounter: Payer: Self-pay | Admitting: Sports Medicine

## 2014-07-22 VITALS — BP 137/93 | HR 85 | Ht 63.0 in | Wt 124.0 lb

## 2014-07-22 DIAGNOSIS — B070 Plantar wart: Secondary | ICD-10-CM

## 2014-07-22 DIAGNOSIS — M5416 Radiculopathy, lumbar region: Secondary | ICD-10-CM

## 2014-07-22 DIAGNOSIS — IMO0002 Reserved for concepts with insufficient information to code with codable children: Secondary | ICD-10-CM

## 2014-07-22 MED ORDER — AMITRIPTYLINE HCL 50 MG PO TABS: ORAL_TABLET | ORAL | Status: DC

## 2014-07-22 NOTE — Progress Notes (Signed)
  Subjective:    CC: Followup  HPI: Lumbar radiculitis: Left-sided, clinically L4, MRI results will be dictated below. She is on somewhat truncated scheduled for treatment.  Plantar warts: Desires retreatment today.  Past medical history, Surgical history, Family history not pertinant except as noted below, Social history, Allergies, and medications have been entered into the medical record, reviewed, and no changes needed.   Review of Systems: No fevers, chills, night sweats, weight loss, chest pain, or shortness of breath.   Objective:    General: Well Developed, well nourished, and in no acute distress.  Neuro: Alert and oriented x3, extra-ocular muscles intact, sensation grossly intact.  HEENT: Normocephalic, atraumatic, pupils equal round reactive to light, neck supple, no masses, no lymphadenopathy, thyroid nonpalpable.  Skin: Warm and dry, no rashes. Cardiac: Regular rate and rhythm, no murmurs rubs or gallops, no lower extremity edema.  Respiratory: Clear to auscultation bilaterally. Not using accessory muscles, speaking in full sentences.  MRI was personally reviewed and does show an L4-L5 disc protrusion with a bilateral foraminal component worse on the left side.  Procedure:  Cryodestruction of 3 plantar warts Consent obtained and verified. Time-out conducted. Noted no overlying erythema, induration, or other signs of local infection. Completed without difficulty using Cryo-Gun. Advised to call if fevers/chills, erythema, induration, drainage, or persistent bleeding.  Procedure:  Trimming of hypertrophic plantar wart. Consent obtained and verified. Time-out conducted. Noted no overlying erythema, induration, or other signs of local infection. Completed using a #15 blade, I trimmed both callus and wart down to healthy skin. Advised to call if fevers/chills, erythema, induration, drainage, or persistent bleeding.  Impression and Recommendations:    I spent 40 minutes  with this patient, greater than 50% was face-to-face time counseling regarding the multiple above diagnoses.

## 2014-07-22 NOTE — Assessment & Plan Note (Signed)
Symptoms do represent a classic left-sided L4 radiculitis. She does have a L4-L5 disc protrusion with a foraminal component. At this point we are going to proceed with formal physical therapy, amitriptyline at bedtime, and a left-sided L4-L5 transforaminal epidural. Return to see me approximately 2-4 weeks after the injection to evaluate response.

## 2014-07-22 NOTE — Assessment & Plan Note (Signed)
Cryotherapy and trimming.

## 2014-07-23 ENCOUNTER — Ambulatory Visit
Admission: RE | Admit: 2014-07-23 | Discharge: 2014-07-23 | Disposition: A | Discharge status: Home / Self Care | Payer: BC Managed Care – PPO | Source: Ambulatory Visit | Attending: Sports Medicine | Admitting: Sports Medicine

## 2014-07-23 VITALS — BP 149/96 | HR 83

## 2014-07-23 DIAGNOSIS — M5416 Radiculopathy, lumbar region: Secondary | ICD-10-CM

## 2014-07-23 MED ORDER — METHYLPREDNISOLONE ACETATE 40 MG/ML INJ SUSP (RADIOLOG
120.0000 mg | Freq: Once | INTRAMUSCULAR | Status: AC
  Administered 2014-07-23: 120 mg via EPIDURAL

## 2014-07-23 MED ORDER — IOHEXOL 180 MG/ML  SOLN
1.0000 mL | Freq: Once | INTRAMUSCULAR | Status: AC | PRN
Start: 2014-07-23 — End: 2014-07-23
  Administered 2014-07-23: 1 mL via EPIDURAL

## 2014-07-23 NOTE — Discharge Instructions (Signed)

## 2014-07-28 ENCOUNTER — Telehealth: Payer: Self-pay

## 2014-07-28 NOTE — Telephone Encounter (Signed)
Melinda Parsons reports she started taking a half a tablet of amitriptyline on Friday night and Saturday she felt tired and a little out of it. She took half a tablet again on Saturday night and the same happened on Sunday. She states since the epidural injection she feels 90 % better. She wanted to known if she could just toss the amitriptyline because of her reaction to it and she her pain is much better.

## 2014-07-28 NOTE — Telephone Encounter (Signed)
Do not throw away but okay to stop taking it for now.

## 2014-07-30 NOTE — Telephone Encounter (Signed)
Left message for patient to return call.

## 2014-07-31 NOTE — Telephone Encounter (Signed)
Patient aware.

## 2014-08-01 ENCOUNTER — Encounter: Payer: Self-pay | Admitting: Sports Medicine

## 2014-08-01 ENCOUNTER — Ambulatory Visit (INDEPENDENT_AMBULATORY_CARE_PROVIDER_SITE_OTHER): Payer: BC Managed Care – PPO | Admitting: Sports Medicine

## 2014-08-01 VITALS — BP 131/95 | HR 94 | Ht 63.0 in | Wt 121.0 lb

## 2014-08-01 DIAGNOSIS — M5416 Radiculopathy, lumbar region: Secondary | ICD-10-CM

## 2014-08-01 DIAGNOSIS — B070 Plantar wart: Secondary | ICD-10-CM

## 2014-08-01 DIAGNOSIS — M722 Plantar fascial fibromatosis: Secondary | ICD-10-CM

## 2014-08-01 NOTE — Assessment & Plan Note (Signed)
Resolved after left-sided L4-L5 transforaminal epidural. We may repeat this up to 6 total times per year.

## 2014-08-01 NOTE — Progress Notes (Signed)
  Subjective:    CC: Followup after epidural  HPI: Left lumbar radiculitis: Melinda Parsons Melinda Parsons had a left-sided L4-L5 transforaminal epidural and reports resolution of her left leg cramping and radicular symptoms.  Plantar warts: Doing much better with Aldara cream, desires repeat cryotherapy.  Plantar fasciitis: resolved with custom orthotics.  Past medical history, Surgical history, Family history not pertinant except as noted below, Social history, Allergies, and medications have been entered into the medical record, reviewed, and no changes needed.   Review of Systems: No fevers, chills, night sweats, weight loss, chest pain, or shortness of breath.   Objective:    General: Well Developed, well nourished, and in no acute distress.  Neuro: Alert and oriented x3, extra-ocular muscles intact, sensation grossly intact.  HEENT: Normocephalic, atraumatic, pupils equal round reactive to light, neck supple, no masses, no lymphadenopathy, thyroid nonpalpable.  Skin: Warm and dry, no rashes. Cardiac: Regular rate and rhythm, no murmurs rubs or gallops, no lower extremity edema.  Respiratory: Clear to auscultation bilaterally. Not using accessory muscles, speaking in full sentences.  Procedure:  Cryodestruction of 3 right-sided plantar warts Consent obtained and verified. Time-out conducted. Noted no overlying erythema, induration, or other signs of local infection. Completed without difficulty using Cryo-Gun. Advised to call if fevers/chills, erythema, induration, drainage, or persistent bleeding.  Impression and Recommendations:

## 2014-08-01 NOTE — Assessment & Plan Note (Signed)
Repeat cryotherapy of 3 plantar warts on the right foot. Improved significantly with Aldara.

## 2014-08-01 NOTE — Assessment & Plan Note (Signed)
Resolved with custom orthotics and rehabilitation. Return as needed.

## 2014-10-17 ENCOUNTER — Telehealth: Payer: Self-pay

## 2014-10-17 NOTE — Telephone Encounter (Signed)
Melinda Parsons will be traveling to [place] and [place] at the end of November. She wanted to know what vaccines she will need before travel. Travax report in your basket.   I have scheduled her for a Annual Exam on the 17 th. I advised her she may need to go to [facility] for some of the vaccine we do not carry and that those are non-billable to insurance. Please advise which vaccines she will need.

## 2014-10-21 NOTE — Telephone Encounter (Signed)
We need to make sure that all of her regular vaccines such as the childhood vaccines including measles months rubella and chickenpox are up-to-date. If she's not sure we can avoid all titers. I do recommend tdap. We did not have one on file for her. Also recommend starting hepatitis A series. Is a 2 shot series. Also yellow fever is recommended but not required. We do not offer yellow fever here but she may be able to get it down stairs in occupational health. We can discuss that at her appointment. I can also provide her a prescription for typhoid. It's actually a vaccine that is in pill form. Typically it is started 1 week prior to exposure. She would want to start this one week prior to going to [place].

## 2014-10-24 NOTE — Telephone Encounter (Signed)
Patient advised of recommendations.  

## 2014-10-28 ENCOUNTER — Encounter: Payer: Self-pay | Admitting: Family Medicine

## 2014-10-28 ENCOUNTER — Ambulatory Visit (INDEPENDENT_AMBULATORY_CARE_PROVIDER_SITE_OTHER): Payer: BC Managed Care – PPO | Admitting: Family Medicine

## 2014-10-28 VITALS — BP 127/79 | HR 85 | Temp 98.1°F | Ht 63.0 in | Wt 122.0 lb

## 2014-10-28 DIAGNOSIS — Z23 Encounter for immunization: Secondary | ICD-10-CM

## 2014-10-28 DIAGNOSIS — Z0000 Encounter for general adult medical examination without abnormal findings: Secondary | ICD-10-CM | POA: Diagnosis not present

## 2014-10-28 NOTE — Progress Notes (Signed)
  Subjective:     Melinda Parsons is a 43 y.o. female and is here for a comprehensive physical exam. The patient reports problems - she is traveling to [place] soon and wants to make sure that her vaccines are up-to-date..she will likely be traveling to [place] in January for her job.  History   Social History  . Marital Status: Married    Spouse Name: N/A    Number of Children: 1  . Years of Education: N/A   Occupational History  .  [employer]    consultant   Social History Main Topics  . Smoking status: Never Smoker   . Smokeless tobacco: Not on file  . Alcohol Use: 0.0 oz/week    1-2 drink(s) per week     Comment: per month  . Drug Use: No  . 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   Other Topics Concern  . Not on file   Social History Narrative  . No narrative on file   Health Maintenance  Topic Date Due  . TETANUS/TDAP  09/15/1991  . PAP SMEAR  04/10/2014  . INFLUENZA VACCINE  07/12/2014    The following portions of the patient's history were reviewed and updated as appropriate: allergies, current medications, past family history, past medical history, past social history, past surgical history and problem list.  Review of Systems A comprehensive review of systems was negative.   Objective:    BP 127/79 mmHg  Pulse 85  Temp(Src) 98.1 F (36.7 C)  Ht 5\' 3"  (1.6 m)  Wt 122 lb (55.339 kg)  BMI 21.62 kg/m2  SpO2 99% General appearance: alert, cooperative and appears stated age Head: Normocephalic, without obvious abnormality, atraumatic Eyes: conj clear EOMI, PEERLA Ears: normal TM's and external ear canals both ears Nose: Nares normal. Septum midline. Mucosa normal. No drainage or sinus tenderness. Throat: lips, mucosa, and tongue normal; teeth and gums normal Neck: no adenopathy, no carotid bruit, no JVD, supple, symmetrical, trachea midline and thyroid not enlarged,  symmetric, no tenderness/mass/nodules Back: symmetric, no curvature. ROM normal. No CVA tenderness. Lungs: clear to auscultation bilaterally Heart: regular rate and rhythm, S1, S2 normal, no murmur, click, rub or gallop Abdomen: soft, non-tender; bowel sounds normal; no masses,  no organomegaly Extremities: extremities normal, atraumatic, no cyanosis or edema Pulses: 2+ and symmetric Skin: Skin color, texture, turgor normal. No rashes or lesions Lymph nodes: Cervical adenopathy: nl and Axillary adenopathy: nl Neurologic: Alert and oriented X 3, normal strength and tone. Normal symmetric reflexes. Normal coordination and gait    Assessment:    Healthy female exam.      Plan:     See After Visit Summary for Counseling Recommendations  Keep up a regular exercise program and make sure you are eating a healthy diet Try to eat 4 servings of dairy a day, or if you are lactose intolerant take a calcium with vitamin D daily.   Due for CMP, fasting lipid panel and TSH. We'll get this up-to-date.  Or travel to [place]. Guidelines recommend up-to-date tdap. That was given today. Also recommend hepatitis A for all travelers. Will give first hepatitis A vaccines today. She will get her second one in 6 months.her flu vaccine is up-to-date. Typhoid is only recommended for going to more oral areas of high risk patients. Again yellow fever only recommended for high risk patients or patients going outside [place].

## 2014-10-28 NOTE — Patient Instructions (Signed)
Follow-up in 6 months for her second hepatitis A vaccine.  Keep up a regular exercise program and make sure you are eating a healthy diet Try to eat 4 servings of dairy a day, or if you are lactose intolerant take a calcium with vitamin D daily.  Your vaccines are up to date.

## 2015-01-09 ENCOUNTER — Telehealth: Payer: Self-pay

## 2015-01-09 NOTE — Telephone Encounter (Signed)
Patient called and left a message. I called and left a message for patient to call back.

## 2015-01-12 NOTE — Telephone Encounter (Signed)
Melinda Parsons called and reports she had swelling and redness in both hands and sore throat on Thursday. Left hand seems to be worse. She thinks it was an allergic reaction due to nuts. She took Benadryl and she had some relief but symptoms have not resolved. Denies shortness of breath or mouth swelling. Patient scheduled for appointment.

## 2015-01-14 ENCOUNTER — Encounter: Payer: Self-pay | Admitting: Family Medicine

## 2015-01-14 ENCOUNTER — Ambulatory Visit (INDEPENDENT_AMBULATORY_CARE_PROVIDER_SITE_OTHER): Payer: BLUE CROSS/BLUE SHIELD | Admitting: Family Medicine

## 2015-01-14 VITALS — BP 145/95 | HR 95 | Wt 122.0 lb

## 2015-01-14 DIAGNOSIS — T7840XA Allergy, unspecified, initial encounter: Secondary | ICD-10-CM

## 2015-01-14 NOTE — Progress Notes (Signed)
CC: Melinda Parsons is a 44 y.o. female is here for Allergic Reaction   Subjective: HPI:  On Thursday of last week she ate a new meal containing pine nuts and within 20 minutes had flushing of the face, hands, feet, and splotchy on the back. She also experiencing swelling of the hands bilaterally and feet bilaterally. Symptoms slowly resolved within 24 hours without any particular intervention. it was accompanied by a sore throat which has persisted however she is also expressing postnasal drip along the same time interval. Since the above episode around midday she will have mild swelling in the left hand localized to the fingers and the wrist. Symptoms are mild in severity. Around 4:00 which was the time of her appointment today symptoms usually subside without any intervention. She doesn't believe that she's had any increased sodium in her diet but did eat a [employer] last night. Denies any pain whatsoever in the left hand. Other than symptoms above the only other thing that changed in her life is that she recently has a new position that requires more writing with left hand.. Denies fevers, chills,  shortness of breath,pain, skin changes other than that described above denies abdominal pain or diarrhea   Review Of Systems Outlined In HPI  Past Medical History  Diagnosis Date  . Pre-eclampsia     with daughter  . GERD (gastroesophageal reflux disease)   . PVC (premature ventricular contraction)     Previous holter in Dec 2011 showed PVCs and 2 3 beat runs of SVT  . Hypertension   . IBS (irritable bowel syndrome)     Past Surgical History  Procedure Laterality Date  . No past surgeries     Family History  Problem Relation Age of Onset  . Hypertension Mother   . Hyperlipidemia Mother   . Hypertension      grandmother  . Alcohol abuse      grandparent  . Diverticulitis Father   . Breast cancer Mother 53    History   Social History  . Marital Status: Married    Spouse Name: N/A     Number of Children: 1  . Years of Education: N/A   Occupational History  . Optician, dispensing.  [employer]   Social History Main Topics  . Smoking status: Never Smoker   . Smokeless tobacco: Not on file  . Alcohol Use: 0.0 oz/week    1-2 drink(s) per week     Comment: per month  . Drug Use: No  . 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   Other Topics Concern  . Not on file   Social History Narrative   Some exercise.      Objective: BP 145/95 mmHg  Pulse 95  Wt 122 lb (55.339 kg)  General: Alert and Oriented, No Acute Distress HEENT: Pupils equal, round, reactive to light. Conjunctivae clear.   Pink inferior turbinates.  Moist mucous membranes, pharynx without inflammation nor lesionshowever mild postnasal drip.  Neck supple without palpable lymphadenopathy nor abnormal masses. Lungs: Clear to auscultation bilaterally, no wheezing/ronchi/rales.  Comfortable work of breathing. Good air movement. Cardiac: Regular rate and rhythm.  Extremities: No peripheral edema.  Strong peripheral pulses. No appreciable swelling or edema in both hands from the wrist distally. No appreciable joint hypertrophy of the MCPs PIPs or DIPs in either hand. Mental Status: No depression,  anxiety, nor agitation. Skin: Warm and dry.  Assessment & Plan: Melinda Parsons was seen today for allergic reaction.  Diagnoses and associated orders for this visit:  Allergic reaction, initial encounter - Ambulatory referral to Allergy    Reassurance provided that she may indeed have had a allergic reaction however can't seem to think that any of her symptoms at the present time are due to this reaction. Swelling of the hands and fingers is most likely due to osteoarthritis with new position requiring more writing with that hand. Since she's not having any pain joint decision for no further intervention  other than allergy referral to see if she needs to avoid all nut products.   Return if symptoms worsen or fail to improve.

## 2015-04-22 ENCOUNTER — Ambulatory Visit (INDEPENDENT_AMBULATORY_CARE_PROVIDER_SITE_OTHER): Payer: BLUE CROSS/BLUE SHIELD | Admitting: Sports Medicine

## 2015-04-22 ENCOUNTER — Encounter: Payer: Self-pay | Admitting: Sports Medicine

## 2015-04-22 ENCOUNTER — Ambulatory Visit (INDEPENDENT_AMBULATORY_CARE_PROVIDER_SITE_OTHER): Payer: BLUE CROSS/BLUE SHIELD

## 2015-04-22 VITALS — BP 120/85 | HR 93 | Ht 63.0 in | Wt 124.0 lb

## 2015-04-22 DIAGNOSIS — M25571 Pain in right ankle and joints of right foot: Secondary | ICD-10-CM | POA: Diagnosis not present

## 2015-04-22 DIAGNOSIS — M791 Myalgia, unspecified site: Secondary | ICD-10-CM

## 2015-04-22 DIAGNOSIS — R5383 Other fatigue: Secondary | ICD-10-CM | POA: Insufficient documentation

## 2015-04-22 LAB — CBC WITH DIFFERENTIAL/PLATELET
Basophils Absolute: 0.1 10*3/uL (ref 0.0–0.1)
Basophils Relative: 1 % (ref 0–1)
Eosinophils Absolute: 0.1 K/uL (ref 0.0–0.7)
Eosinophils Relative: 1 % (ref 0–5)
HCT: 43.8 % (ref 36.0–46.0)
Hemoglobin: 14.9 g/dL (ref 12.0–15.0)
Lymphocytes Relative: 30 % (ref 12–46)
Lymphs Abs: 1.7 K/uL (ref 0.7–4.0)
MCH: 30.8 pg (ref 26.0–34.0)
MCHC: 34 g/dL (ref 30.0–36.0)
MCV: 90.7 fL (ref 78.0–100.0)
MPV: 10 fL (ref 8.6–12.4)
Monocytes Absolute: 0.6 K/uL (ref 0.1–1.0)
Monocytes Relative: 10 % (ref 3–12)
Neutro Abs: 3.4 10*3/uL (ref 1.7–7.7)
Neutrophils Relative %: 58 % (ref 43–77)
Platelets: 274 10*3/uL (ref 150–400)
RBC: 4.83 MIL/uL (ref 3.87–5.11)
RDW: 12.9 % (ref 11.5–15.5)
WBC: 5.8 10*3/uL (ref 4.0–10.5)

## 2015-04-22 LAB — HEMOGLOBIN A1C
Hgb A1c MFr Bld: 4.9 % (ref <5.7)
Mean Plasma Glucose: 94 mg/dL (ref <117)

## 2015-04-22 LAB — D-DIMER, QUANTITATIVE: D-Dimer, Quant: 0.27 ug/mL-FEU (ref 0.00–0.48)

## 2015-04-22 MED ORDER — PREGABALIN 50 MG PO CAPS: 50.0000 mg | ORAL_CAPSULE | Freq: Two times a day (BID) | ORAL | Status: DC

## 2015-04-22 NOTE — Addendum Note (Signed)
Addended by: Monica BectonHEKKEKANDAM, THOMAS J on: 04/22/2015 02:07 PM   Modules accepted: Level of Service

## 2015-04-22 NOTE — Assessment & Plan Note (Addendum)
Widespread aches and pains, I do suspect there is an element of fibromyalgia. Adding Lyrica samples. Her OB/GYN did start Lexapro, we will probably switch this to Cymbalta in the future if no effect with Lyrica. Checking blood work as she did get back from [place], D dimers, viral titers for Chikungunya and Dengue. X-rays of her toe, she has pain at the right first interphalangeal joint. Return in 2 weeks to see how things are going.

## 2015-04-22 NOTE — Progress Notes (Signed)
  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 She has recently returned from [place], and has had significant sore throat, muscle aches, body aches without fevers or chills. She also has some pain that she localizes in her right first interphalangeal joint of her foot. On further questioning she was recently placed on Lexapro, by her OB/GYN, and does have widespread muscle aches and pains as well as hyperesthesia and hyperalgia to touch on her skin. She does have some calf pain but it is bilateral. Symptoms are moderate, persistent.  Past medical history, Surgical history, Family history not pertinant except as noted below, Social history, Allergies, and medications have been entered into the medical record, reviewed, and no changes needed.   Review of Systems: No fevers, chills, night sweats, weight loss, chest pain, or shortness of breath.   Objective:    General: Well Developed, well nourished, and in no acute distress.  Neuro: Alert and oriented x3, extra-ocular muscles intact, sensation grossly intact.  HEENT: Normocephalic, atraumatic, pupils equal round reactive to light, neck supple, no masses, no lymphadenopathy, thyroid nonpalpable.  Skin: Warm and dry, no rashes. Cardiac: Regular rate and rhythm, no murmurs rubs or gallops, no lower extremity edema.  Respiratory: Clear to auscultation bilaterally. Not using accessory muscles, speaking in full sentences. rightFoot: No visible erythema or swelling. Range of motion is full in all directions. Strength is 5/5 in all directions. No hallux valgus. No pes cavus or pes planus. No abnormal callus noted. No pain over the navicular prominence, or base of fifth metatarsal. No tenderness to palpation of the calcaneal insertion of plantar  fascia. No pain at the Achilles insertion. No pain over the calcaneal bursa. No pain of the retrocalcaneal bursa. Tender to palpation at the right first interphalangeal joint, also with positive tenderness to squeeze on the left and right calves, neither calf is swollen. No hallux rigidus or limitus. No tenderness palpation over interphalangeal joints. No pain with compression of the metatarsal heads. Neurovascularly intact distally.  Impression and Recommendations:

## 2015-04-23 LAB — COMPREHENSIVE METABOLIC PANEL WITH GFR
ALT: 14 U/L (ref 0–35)
Alkaline Phosphatase: 51 U/L (ref 39–117)
Total Bilirubin: 0.5 mg/dL (ref 0.2–1.2)

## 2015-04-23 LAB — COMPREHENSIVE METABOLIC PANEL
AST: 17 U/L (ref 0–37)
Albumin: 4.2 g/dL (ref 3.5–5.2)
BUN: 11 mg/dL (ref 6–23)
CO2: 26 mEq/L (ref 19–32)
Calcium: 9 mg/dL (ref 8.4–10.5)
Chloride: 100 mEq/L (ref 96–112)
Creat: 0.82 mg/dL (ref 0.50–1.10)
Glucose, Bld: 74 mg/dL (ref 70–99)
Potassium: 4.6 mEq/L (ref 3.5–5.3)
Sodium: 138 mEq/L (ref 135–145)
Total Protein: 7.2 g/dL (ref 6.0–8.3)

## 2015-04-23 LAB — SEDIMENTATION RATE: Sed Rate: 4 mm/hr (ref 0–20)

## 2015-04-23 LAB — CK: Total CK: 73 U/L (ref 7–177)

## 2015-04-27 LAB — DENGUE FEVER ABS, IGG, IGM
Dengue Fever Ab (IgM): 0.45
Dengue Fever Antibodies (IgG, IgM): 0.89

## 2015-04-27 LAB — CHIKUNGUNYA ABS W/REFLEX TO TITER
Chikungunya IgG Screen: NEGATIVE
Chikungunya IgM Screen: NEGATIVE

## 2015-05-05 ENCOUNTER — Telehealth: Payer: Self-pay | Admitting: *Deleted

## 2015-05-05 NOTE — Telephone Encounter (Signed)
Pt notified.  She states that the tingling has pretty much stopped.  However, her right great toe is still red, swollen, & painful. She went to one PT session yesterday and it went well.  My thought was to maybe give PT one more session but wanted to discuss with you first.  She did not start the medication due to reading all the possible side effects.

## 2015-05-05 NOTE — Telephone Encounter (Signed)
All labs are negative including Dengue fever and Chikungunya titers.  Plan doesn't change for now

## 2015-05-05 NOTE — Telephone Encounter (Signed)
If the tingling stopped, she doesn't have to take the lyrica.

## 2015-05-05 NOTE — Telephone Encounter (Signed)
Pt left vm this morning wanting to know her lab results.  I looked back in her chart and there are results from 5/11 but there were no provider notes.

## 2015-05-06 NOTE — Telephone Encounter (Signed)
She wanted to know if she truly needed to follow up with you if there were still technically no changes, especially in her toe.

## 2015-05-06 NOTE — Telephone Encounter (Signed)
If the big toe joint is still swollen, painful, then we do need to proceed to the next step, which is probably going to be an injection into the joint.

## 2015-05-07 NOTE — Telephone Encounter (Signed)
Pt notified of recommendations & will call back to schedule appt.

## 2015-05-08 ENCOUNTER — Ambulatory Visit: Payer: BLUE CROSS/BLUE SHIELD | Admitting: Sports Medicine

## 2015-05-20 ENCOUNTER — Encounter: Payer: Self-pay | Admitting: Sports Medicine

## 2015-05-20 ENCOUNTER — Ambulatory Visit (INDEPENDENT_AMBULATORY_CARE_PROVIDER_SITE_OTHER): Payer: BLUE CROSS/BLUE SHIELD | Admitting: Sports Medicine

## 2015-05-20 VITALS — BP 136/91 | HR 73 | Ht 63.0 in | Wt 125.0 lb

## 2015-05-20 DIAGNOSIS — R5383 Other fatigue: Secondary | ICD-10-CM

## 2015-05-20 DIAGNOSIS — R200 Anesthesia of skin: Secondary | ICD-10-CM

## 2015-05-20 DIAGNOSIS — R202 Paresthesia of skin: Principal | ICD-10-CM

## 2015-05-20 DIAGNOSIS — F329 Major depressive disorder, single episode, unspecified: Secondary | ICD-10-CM

## 2015-05-20 DIAGNOSIS — G43109 Migraine with aura, not intractable, without status migrainosus: Secondary | ICD-10-CM | POA: Insufficient documentation

## 2015-05-20 DIAGNOSIS — R252 Cramp and spasm: Secondary | ICD-10-CM

## 2015-05-20 DIAGNOSIS — F32A Depression, unspecified: Secondary | ICD-10-CM

## 2015-05-20 LAB — TSH: TSH: 0.987 u[IU]/mL (ref 0.350–4.500)

## 2015-05-20 MED ORDER — DULOXETINE HCL 30 MG PO CPEP: 30.0000 mg | ORAL_CAPSULE | Freq: Every day | ORAL | Status: DC

## 2015-05-20 MED ORDER — MAGNESIUM OXIDE 400 MG PO TABS: 800.0000 mg | ORAL_TABLET | Freq: Two times a day (BID) | ORAL | Status: DC

## 2015-05-20 NOTE — Assessment & Plan Note (Addendum)
With vague migratory neurologic symptoms this likely represents a myofascial pain syndrome. Currently there is left-sided face paresthesias, previously there was unilateral lower extremity paresthesias in no specific dermatome. Certainly with her heritage we would not want to miss multiple sclerosis. I'm going to get an MRI of the brain with and without contrast.  MRI is essentially negative with the exception of some splotchy white matter changes, nonspecific, I would like her to touch base with neurology regarding this.

## 2015-05-20 NOTE — Progress Notes (Signed)
  Subjective:    CC: Follow-up  HPI: Numbness and tingling of the face: Left-sided, she also had an episode of her lower extremity numbness and tingling, symptoms are moderate, persistent, no pain.  Hot flashes: Occasionally wakes up in sweats, no weight loss, no fevers. She also gets occasional vasomotor instability of her legs and arms. Mild mood changes, she is on birth control, NuvaRing, does 2 months at a time.  Mood disorder: Placed on Lexapro by her OB/GYN, has not noted any improvement after greater than 6 weeks.  Past medical history, Surgical history, Family history not pertinant except as noted below, Social history, Allergies, and medications have been entered into the medical record, reviewed, and no changes needed.   Review of Systems: No fevers, chills, night sweats, weight loss, chest pain, or shortness of breath.   Objective:    General: Well Developed, well nourished, and in no acute distress.  Neuro: Alert and oriented x3, extra-ocular muscles intact, sensation grossly intact.  HEENT: Normocephalic, atraumatic, pupils equal round reactive to light, neck supple, no masses, no lymphadenopathy, thyroid nonpalpable.  Skin: Warm and dry, no rashes. Cardiac: Regular rate and rhythm, no murmurs rubs or gallops, no lower extremity edema.  Respiratory: Clear to auscultation bilaterally. Not using accessory muscles, speaking in full sentences.  Impression and Recommendations:

## 2015-05-20 NOTE — Assessment & Plan Note (Addendum)
CBC, metabolic panel, other workup was negative. She does have some vasomotor instability. I'm going to check FSH, LH and progesterone levels looking into perimenopause. Considering night sweats we are going to add a Quantiferon Gold. She is approximately 7-8 days from starting removing her NuvaRing/her bleed, she usually does NuvaRing and keeps it in for 2 months at a time. We are also going to switch from Lexapro to Cymbalta, Lexapro was started by her OB/GYN. She declines to take the Lyrica.

## 2015-05-20 NOTE — Addendum Note (Signed)
Addended by: Monica BectonHEKKEKANDAM, Brisa Auth J on: 05/20/2015 02:18 PM   Modules accepted: Orders

## 2015-05-21 LAB — LUTEINIZING HORMONE: LH: <0.1 m[IU]/mL

## 2015-05-21 LAB — PROGESTERONE: Progesterone: 0.5 ng/mL

## 2015-05-21 LAB — FOLLICLE STIMULATING HORMONE: FSH: <0.3 m[IU]/mL

## 2015-05-22 LAB — ESTROGENS, TOTAL: Estrogen: 126 pg/mL

## 2015-05-25 ENCOUNTER — Ambulatory Visit (INDEPENDENT_AMBULATORY_CARE_PROVIDER_SITE_OTHER): Payer: BLUE CROSS/BLUE SHIELD

## 2015-05-25 DIAGNOSIS — R209 Unspecified disturbances of skin sensation: Secondary | ICD-10-CM

## 2015-05-25 MED ORDER — GADOBENATE DIMEGLUMINE 529 MG/ML IV SOLN
10.0000 mL | Freq: Once | INTRAVENOUS | Status: AC | PRN
  Administered 2015-05-25: 10 mL via INTRAVENOUS

## 2015-05-26 NOTE — Addendum Note (Signed)
Addended by: Monica Becton on: 05/26/2015 12:55 PM   Modules accepted: Orders

## 2015-05-27 ENCOUNTER — Telehealth: Payer: Self-pay | Admitting: Family Medicine

## 2015-05-27 NOTE — Telephone Encounter (Signed)
Pt called. She has concerns about being referred to Neurologist and wants to know if she can have a 10 minute telephone conversation with you about it. She did get a call from [facility] but she still has questions.  Thank you.

## 2015-05-27 NOTE — Telephone Encounter (Signed)
Dr. T please see note below. Seren Chaloux,CMA  

## 2015-05-29 ENCOUNTER — Ambulatory Visit: Payer: BLUE CROSS/BLUE SHIELD | Admitting: Sports Medicine

## 2015-06-02 ENCOUNTER — Encounter: Payer: BLUE CROSS/BLUE SHIELD | Admitting: Sports Medicine

## 2015-06-03 ENCOUNTER — Encounter: Payer: Self-pay | Admitting: Neurology

## 2015-06-03 ENCOUNTER — Ambulatory Visit (INDEPENDENT_AMBULATORY_CARE_PROVIDER_SITE_OTHER): Payer: BLUE CROSS/BLUE SHIELD | Admitting: Neurology

## 2015-06-03 VITALS — BP 142/92 | HR 82 | Ht 63.0 in | Wt 124.0 lb

## 2015-06-03 DIAGNOSIS — R202 Paresthesia of skin: Secondary | ICD-10-CM | POA: Diagnosis not present

## 2015-06-03 NOTE — Progress Notes (Signed)
PATIENT: Melinda Parsons DOB: 14-May-1971  Chief Complaint  Patient presents with  . Numbness    She is here with her husband, Lennette Bihari.  She started having numbness, tingling on the left side of her face about three weeks ago.  Now she is experiencing it in various areas of her body.  She has had worsening vision in her left eye.  Also, she has noticed urinary frequency and leakage.    HISTORICAL  Melinda Parsons is a 44 years old left-handed female, accompanied by her husband, seen referred by her primary care physician Hali Marry for left facial numbness, MRI brain result.  In June first 2016, she noticed left ear turn to cold, later she also noticed intermittent numbness tingling involving her left ear, over the past 3 weeks, she also noticed intermittent numbness involving her left cheek, occasionally left jawline, spare her left forehead, the left ear numbness tingling can sometimes progress to mild burning pain, she denied rash mild blurry vision on the left side, no hearing change.  In addition, she also noticed intermittent bilateral hands, feet paresthesia, 2 weeks history of constipation, urinary urgency, occasionally bowel accident. She denies gait difficulty,  We have reviewed laboratory since May 2016, normal CMP, CBC, TSH, A1c, ESR,   we also reviewed MRI of the brain in June 2016, mild supratentorium nonspecific white matter changes, more on the left side, most consistent with small vessel disease.  Patient also complains of few years history of intermittent headaches, occasionally typical migraine, preceded by left visual aura, followed by severe lateralized headaches, with associated light noise sensitivity, recent 1 year, she no longer has her typical severe migraine, but continue have mild to moderate weekly headaches.   REVIEW OF SYSTEMS: Full 14 system review of systems performed and notable only for weight gain, fatigue, blurred vision, incontinence,  constipation, easy bleeding, cramps, achy muscles, allergy, skin sensitivity, numbness, difficulty swallowing, dizziness  ALLERGIES: No Known Allergies  HOME MEDICATIONS: Current Outpatient Prescriptions  Medication Sig Dispense Refill  . DULoxetine (CYMBALTA) 30 MG capsule Take 1 capsule (30 mg total) by mouth daily. 30 capsule 3  . etonogestrel-ethinyl estradiol (NUVARING) 0.12-0.015 MG/24HR vaginal ring Place 1 each vaginally every 28 (twenty-eight) days. Insert vaginally and leave in place for 3 consecutive weeks, then remove for 1 week.     . fexofenadine (ALLEGRA) 180 MG tablet Take 180 mg by mouth daily.    . magnesium oxide (MAG-OX) 400 MG tablet Take 2 tablets (800 mg total) by mouth 2 (two) times daily. 180 tablet 3     PAST MEDICAL HISTORY: Past Medical History  Diagnosis Date  . Pre-eclampsia     with daughter  . GERD (gastroesophageal reflux disease)   . PVC (premature ventricular contraction)     Previous holter in Dec 2011 showed PVCs and 2 3 beat runs of SVT  . Hypertension   . IBS (irritable bowel syndrome)   . Migraine   . Lumbar herniated disc   . Seasonal allergies   . Anxiety     PAST SURGICAL HISTORY: Past Surgical History  Procedure Laterality Date  . No past surgeries      FAMILY HISTORY: Family History  Problem Relation Age of Onset  . Hypertension Mother   . Hyperlipidemia Mother   . Hypertension      grandmother  . Alcohol abuse      grandparent  . Diverticulitis Father   . Breast cancer Mother 80    SOCIAL HISTORY:  History   Social History  . Marital Status: Married    Spouse Name: N/A  . Number of Children: 1  . Years of Education: 16   Occupational History  . Administrator, sports.  Vf Engineer, agricultural   Social History Main Topics  . Smoking status: Never Smoker   . Smokeless tobacco: Not on file  . Alcohol Use: 0.6 - 1.2 oz/week    1-2 Standard drinks or equivalent per week     Comment:  Socially  . Drug Use: No  . 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   Other Topics Concern  . Not on file   Social History Narrative   Lives at home with her husband and daughter.   Left-handed.   4-6 cups caffeine per day.     PHYSICAL EXAM   Filed Vitals:   06/03/15 0753  BP: 142/92  Pulse: 82  Height: '5\' 3"'  (1.6 m)  Weight: 124 lb (56.246 kg)    Not recorded      Body mass index is 21.97 kg/(m^2).  PHYSICAL EXAMNIATION:  Gen: NAD, conversant, well nourised, obese, well groomed                     Cardiovascular: Regular rate rhythm, no peripheral edema, warm, nontender. Eyes: Conjunctivae clear without exudates or hemorrhage Neck: Supple, no carotid bruise. Pulmonary: Clear to auscultation bilaterally   NEUROLOGICAL EXAM:  MENTAL STATUS: Speech:    Speech is normal; fluent and spontaneous with normal comprehension.  Cognition:    The patient is oriented to person, place, and time;     recent and remote memory intact;     language fluent;     normal attention, concentration,     fund of knowledge.  CRANIAL NERVES: CN II: Visual fields are full to confrontation. Fundoscopic exam is normal with sharp discs and no vascular changes. Venous pulsations are present bilaterally. Pupils are 4 mm and briskly reactive to light. Visual acuity is 20/20 bilaterally. CN III, IV, VI: extraocular movement are normal. No ptosis. CN V: Facial sensation is intact to pinprick in all 3 divisions bilaterally. Corneal responses are intact.  CN VII: Face is symmetric with normal eye closure and smile. CN VIII: Hearing is normal to rubbing fingers CN IX, X: Palate elevates symmetrically. Phonation is normal. CN XI: Head turning and shoulder shrug are intact CN XII: Tongue is midline with normal movements and no atrophy.  MOTOR: There is no pronator drift of out-stretched arms. Muscle bulk and tone are  normal. Muscle strength is normal.  REFLEXES: Reflexes are 2+ and symmetric at the biceps, triceps, knees, and ankles. Plantar responses are flexor.  SENSORY: Light touch, pinprick, position sense, and vibration sense are intact in fingers and toes.  COORDINATION: Rapid alternating movements and fine finger movements are intact. There is no dysmetria on finger-to-nose and heel-knee-shin. There are no abnormal or extraneous movements.   GAIT/STANCE: Posture is normal. Gait is steady with normal steps, base, arm swing, and turning. Heel and toe walking are normal. Tandem gait is normal.  Romberg is absent.   DIAGNOSTIC DATA (LABS, IMAGING, TESTING) - I reviewed patient records, labs, notes, testing and imaging myself where available.  Lab Results  Component Value Date   WBC 5.8 04/22/2015   HGB 14.9 04/22/2015   HCT 43.8 04/22/2015   MCV 90.7  04/22/2015   PLT 274 04/22/2015      Component Value Date/Time   NA 138 04/22/2015 1035   K 4.6 04/22/2015 1035   CL 100 04/22/2015 1035   CO2 26 04/22/2015 1035   GLUCOSE 74 04/22/2015 1035   BUN 11 04/22/2015 1035   CREATININE 0.82 04/22/2015 1035   CREATININE .8 11/18/2010 2135   CALCIUM 9.0 04/22/2015 1035   PROT 7.2 04/22/2015 1035   ALBUMIN 4.2 04/22/2015 1035   AST 17 04/22/2015 1035   ALT 14 04/22/2015 1035   ALKPHOS 51 04/22/2015 1035   BILITOT 0.5 04/22/2015 1035   GFRNONAA 80 10/17/2012 0918   GFRNONAA >60 11/18/2010 2135   GFRAA >89 10/17/2012 0918   GFRAA  11/18/2010 2135    >60        The eGFR has been calculated using the MDRD equation. This calculation has not been validated in all clinical situations. eGFR's persistently <60 mL/min signify possible Chronic Kidney Disease.   Lab Results  Component Value Date   CHOL 182 10/13/2011   HDL 54 10/13/2011   LDLCALC 106* 10/13/2011   TRIG 110 10/13/2011   CHOLHDL 3.4 10/13/2011   Lab Results  Component Value Date   HGBA1C 4.9 04/22/2015   No results  found for: [id number] Lab Results  Component Value Date   TSH 0.987 05/20/2015     ASSESSMENT AND PLAN  Melinda Parsons is a 44 y.o. female with 3 weeks history of left ear, left cheek paresthesia, intermittent bilateral hands, feet paresthesia, normal neurological examination, we have reviewed MRI of brain, mild supratentorium nonspecific white matter changes, most consistent with her history of migraine, small vessel disease.  1, patient is concerned about her new onset constellation of symptoms, desires further evaluation, proceed with MRI of cervical spine I will call her result 2, return to clinic in 3 months.,    Marcial Pacas, M.D. Ph.D.  [facility] [address] [place], [place] [address] Ph: [phone] Fax: [phone]

## 2015-06-17 ENCOUNTER — Ambulatory Visit (INDEPENDENT_AMBULATORY_CARE_PROVIDER_SITE_OTHER): Payer: BLUE CROSS/BLUE SHIELD

## 2015-06-17 DIAGNOSIS — R202 Paresthesia of skin: Secondary | ICD-10-CM | POA: Diagnosis not present

## 2015-06-20 ENCOUNTER — Telehealth: Payer: Self-pay | Admitting: Neurology

## 2015-06-20 NOTE — Telephone Encounter (Addendum)
Please call patient: MRI cervical spine showed degenerative disease, but no evidence of cervical cord lesions, findings would not explain all of symptoms. I will go over detail at her next follow up visit.  Abnormal MRI cervical spine (without) demonstrating: 1. At C5-6: disc bulging and facet hypertrophy with mild spinal stenosis and severe biforaminal stenosis; no cord signal changes 2. At C6-7: disc bulging and facet hypertrophy with mild spinal stenosis or foraminal narrowing; no cord signal changes

## 2015-06-22 NOTE — Telephone Encounter (Signed)
Patient aware of results.

## 2015-07-02 ENCOUNTER — Ambulatory Visit (INDEPENDENT_AMBULATORY_CARE_PROVIDER_SITE_OTHER): Payer: BLUE CROSS/BLUE SHIELD | Admitting: Sports Medicine

## 2015-07-02 ENCOUNTER — Encounter: Payer: Self-pay | Admitting: Sports Medicine

## 2015-07-02 VITALS — Ht 63.0 in | Wt 127.0 lb

## 2015-07-02 DIAGNOSIS — G43109 Migraine with aura, not intractable, without status migrainosus: Secondary | ICD-10-CM

## 2015-07-02 DIAGNOSIS — G43909 Migraine, unspecified, not intractable, without status migrainosus: Secondary | ICD-10-CM

## 2015-07-02 DIAGNOSIS — M5030 Other cervical disc degeneration, unspecified cervical region: Secondary | ICD-10-CM | POA: Insufficient documentation

## 2015-07-02 DIAGNOSIS — R002 Palpitations: Secondary | ICD-10-CM

## 2015-07-02 DIAGNOSIS — M722 Plantar fascial fibromatosis: Secondary | ICD-10-CM | POA: Diagnosis not present

## 2015-07-02 DIAGNOSIS — M5412 Radiculopathy, cervical region: Secondary | ICD-10-CM

## 2015-07-02 DIAGNOSIS — M5416 Radiculopathy, lumbar region: Secondary | ICD-10-CM

## 2015-07-02 DIAGNOSIS — I10 Essential (primary) hypertension: Secondary | ICD-10-CM

## 2015-07-02 DIAGNOSIS — R635 Abnormal weight gain: Secondary | ICD-10-CM

## 2015-07-02 MED ORDER — TOPIRAMATE 50 MG PO TABS: ORAL_TABLET | ORAL | Status: DC

## 2015-07-02 MED ORDER — PHENTERMINE HCL 37.5 MG PO TABS: ORAL_TABLET | ORAL | Status: DC

## 2015-07-02 NOTE — Assessment & Plan Note (Signed)
Starting phentermine. Weight is 115 pounds Return monthly.

## 2015-07-02 NOTE — Progress Notes (Signed)
  Subjective:    CC: Follow-up  HPI: Melinda Parsons is here for orthotics.  Brain MRI changes: She did touch base with neurology, changes were consistent with migraines, in fact she does have left-sided cranial, radiating to perioral numbness and tingling followed by headaches and suggestive of complicated migraines.  She also gets episodes of photophobia, nausea, weakness. She is not taking any migraine preventative medication, she does get these symptoms several times per week.  Cervical degenerative disc disease: Also gets vague posterior scalp shooting pains, with occasional shooting pains down her left arm. Neurology did obtain a cervical spine MRI which showed moderate central canal stenosis at 2 levels from cervical degenerative disc disease.  Abnormal weight gain: Did gain a significant amount of weight on Cymbalta and has since discontinued it. She has been struggling to lose weight for some time now, and would feel significantly better about herself if she could lose about 10-15 pounds.  Past medical history, Surgical history, Family history not pertinant except as noted below, Social history, Allergies, and medications have been entered into the medical record, reviewed, and no changes needed.   Review of Systems: No fevers, chills, night sweats, weight loss, chest pain, or shortness of breath.   Objective:    General: Well Developed, well nourished, and in no acute distress.  Neuro: Alert and oriented x3, extra-ocular muscles intact, sensation grossly intact.  HEENT: Normocephalic, atraumatic, pupils equal round reactive to light, neck supple, no masses, no lymphadenopathy, thyroid nonpalpable.  Skin: Warm and dry, no rashes. Cardiac: Regular rate and rhythm, no murmurs rubs or gallops, no lower extremity edema.  Respiratory: Clear to auscultation bilaterally. Not using accessory muscles, speaking in full sentences.  Patient was fitted for a : standard, cushioned, semi-rigid  orthotic. The orthotic was heated and afterward the patient stood on the orthotic blank positioned on the orthotic stand. The patient was positioned in subtalar neutral position and 10 degrees of ankle dorsiflexion in a weight bearing stance. After completion of molding, a stable base was applied to the orthotic blank. The blank was ground to a stable position for weight bearing. Size: 6 Base: [facility], hospital and Padding: None The patient ambulated these, and they were very comfortable.  Impression and Recommendations:    I spent 40 minutes with this patient, greater than 50% was face-to-face time counseling regarding the below diagnosis.

## 2015-07-02 NOTE — Assessment & Plan Note (Signed)
Continues to do well after epidural. 

## 2015-07-02 NOTE — Assessment & Plan Note (Signed)
Adding low-dose Topamax, this is likely responsible for the left-sided facial numbness, photophobia, and headaches. There is also likely an element of cervical cephalalgia. Keep a migraine diary.

## 2015-07-02 NOTE — Assessment & Plan Note (Signed)
Left sided C6-C7 interlaminar epidural

## 2015-07-02 NOTE — Patient Instructions (Signed)
Phentermine for weight loss Topamax to prevent migraines, please keep a migraine diary Epidural for neck, and pain shooting over the top of the head as well as the left arm.

## 2015-07-02 NOTE — Assessment & Plan Note (Signed)
Custom orthotics as above. 

## 2015-07-10 ENCOUNTER — Telehealth: Payer: Self-pay | Admitting: Sports Medicine

## 2015-07-10 NOTE — Telephone Encounter (Signed)
Melinda Parsons, Pt called. She has not heard anything back on her epidural for shot in the neck. She can be reached on her work ph  [phone] or husband [phone].  Thanks

## 2015-07-10 NOTE — Telephone Encounter (Signed)
SPOKE TO PATIENT GAVE HER THE NUMBER FOR [facility] ALSO CALLED THEM MYSELF AND LEFT ANOTHER MESSAGE FOR THEM TO CALL THE PATIENT AND SCHEDULE. Rhonda Cunningham,CMA

## 2015-07-23 ENCOUNTER — Encounter: Payer: Self-pay | Admitting: Family Medicine

## 2015-07-23 ENCOUNTER — Ambulatory Visit (INDEPENDENT_AMBULATORY_CARE_PROVIDER_SITE_OTHER): Payer: BLUE CROSS/BLUE SHIELD | Admitting: Family Medicine

## 2015-07-23 VITALS — BP 135/97 | HR 95 | Temp 98.2°F | Wt 120.0 lb

## 2015-07-23 DIAGNOSIS — R197 Diarrhea, unspecified: Secondary | ICD-10-CM | POA: Insufficient documentation

## 2015-07-23 DIAGNOSIS — A09 Infectious gastroenteritis and colitis, unspecified: Secondary | ICD-10-CM

## 2015-07-23 MED ORDER — METRONIDAZOLE 500 MG PO TABS: 500.0000 mg | ORAL_TABLET | Freq: Three times a day (TID) | ORAL | Status: DC

## 2015-07-23 MED ORDER — CIPROFLOXACIN HCL 500 MG PO TABS: 500.0000 mg | ORAL_TABLET | Freq: Two times a day (BID) | ORAL | Status: DC

## 2015-07-23 NOTE — Patient Instructions (Addendum)
Thank you for coming in today. If your belly pain worsens, or you have high fever, bad vomiting, blood in your stool or black tarry stool go to the Emergency Room.  Take Cipro Flagyl for one week. Bring the stool culture back.  Return if not better

## 2015-07-23 NOTE — Assessment & Plan Note (Signed)
Possibly infectious. Stool culture CBC CMP pending. Empiric treatment with Cipro Flagyl. If not better will obtain CT scan.

## 2015-07-23 NOTE — Progress Notes (Signed)
Melinda Parsons is a 44 y.o. female who presents to [facility]  today for diarrhea and blood in the stool. Patient has a five-day history of cramping diarrhea and some blood in the stool. This started after she ate at [employer]. She has a symptoms have improved a bit. She has not tried any medications. She denies significant abdominal pain. She does note she feels fatigued. No chest pains palpitations or shortness of breath.   Past Medical History  Diagnosis Date  . Pre-eclampsia     with daughter  . GERD (gastroesophageal reflux disease)   . PVC (premature ventricular contraction)     Previous holter in Dec 2011 showed PVCs and 2 3 beat runs of SVT  . Hypertension   . IBS (irritable bowel syndrome)   . Migraine   . Lumbar herniated disc   . Seasonal allergies   . Anxiety    Past Surgical History  Procedure Laterality Date  . No past surgeries     Social History  Substance Use Topics  . Smoking status: Never Smoker   . Smokeless tobacco: Not on file  . Alcohol Use: 0.6 - 1.2 oz/week    1-2 Standard drinks or equivalent per week     Comment: Socially   ROS as above Medications: Current Outpatient Prescriptions  Medication Sig Dispense Refill  . etonogestrel-ethinyl estradiol (NUVARING) 0.12-0.015 MG/24HR vaginal ring Place 1 each vaginally every 28 (twenty-eight) days. Insert vaginally and leave in place for 3 consecutive weeks, then remove for 1 week.     . fexofenadine (ALLEGRA) 180 MG tablet Take 180 mg by mouth daily.    . phentermine (ADIPEX-P) 37.5 MG tablet One tab by mouth qAM 30 tablet 0  . topiramate (TOPAMAX) 50 MG tablet One half tab by mouth daily at bedtime x1 week then 1 tab by mouth daily at bedtime 30 tablet 3  . ciprofloxacin (CIPRO) 500 MG tablet Take 1 tablet (500 mg total) by mouth 2 (two) times daily. 14 tablet 0  . metroNIDAZOLE (FLAGYL) 500 MG tablet Take 1 tablet (500 mg total) by mouth 3 (three) times daily. 21  tablet 0   No current facility-administered medications for this visit.   No Known Allergies   Exam:  BP 135/97 mmHg  Pulse 95  Temp(Src) 98.2 F (36.8 C)  Wt 120 lb (54.432 kg) Gen: Well NAD HEENT: EOMI,  MMM Lungs: Normal work of breathing. CTABL Heart: RRR no MRG Abd: NABS, Soft. Nondistended, mildly tender left lower quadrant without rebound or guarding. No masses palpated Exts: Brisk capillary refill, warm and well perfused.    No results found for this or any previous visit (from the past 24 hour(s)). No results found.   Please see individual assessment and plan sections.

## 2015-07-24 ENCOUNTER — Other Ambulatory Visit: Payer: BLUE CROSS/BLUE SHIELD

## 2015-07-24 LAB — COMPLETE METABOLIC PANEL WITH GFR
ALBUMIN: 4.3 g/dL (ref 3.6–5.1)
ALK PHOS: 50 U/L (ref 33–115)
ALT: 12 U/L (ref 6–29)
AST: 15 U/L (ref 10–30)
BUN: 14 mg/dL (ref 7–25)
CO2: 25 mmol/L (ref 20–31)
Calcium: 8.9 mg/dL (ref 8.6–10.2)
Chloride: 104 mmol/L (ref 98–110)
Creat: 0.9 mg/dL (ref 0.50–1.10)
GFR, Est African American: >89 mL/min (ref >=60)
GFR, Est Non African American: 79 mL/min (ref >=60)
Glucose, Bld: 89 mg/dL (ref 65–99)
POTASSIUM: 4.5 mmol/L (ref 3.5–5.3)
Sodium: 137 mmol/L (ref 135–146)
Total Bilirubin: 0.4 mg/dL (ref 0.2–1.2)
Total Protein: 7.1 g/dL (ref 6.1–8.1)

## 2015-07-24 LAB — CBC
HCT: 40.8 % (ref 36.0–46.0)
HEMOGLOBIN: 13.9 g/dL (ref 12.0–15.0)
MCH: 31.1 pg (ref 26.0–34.0)
MCHC: 34.1 g/dL (ref 30.0–36.0)
MCV: 91.3 fL (ref 78.0–100.0)
MPV: 9.9 fL (ref 8.6–12.4)
Platelets: 280 10*3/uL (ref 150–400)
RBC: 4.47 MIL/uL (ref 3.87–5.11)
RDW: 13 % (ref 11.5–15.5)
WBC: 6.7 10*3/uL (ref 4.0–10.5)

## 2015-07-24 LAB — LIPASE: LIPASE: 33 U/L (ref 7–60)

## 2015-07-24 NOTE — Progress Notes (Signed)
Quick Note:  Normal, no changes. ______ 

## 2015-07-27 LAB — HM PAP SMEAR: HM PAP: NEGATIVE

## 2015-07-28 ENCOUNTER — Telehealth: Payer: Self-pay | Admitting: *Deleted

## 2015-07-28 NOTE — Telephone Encounter (Signed)
Pt called and lvm asking for rtn call did not say what she wanted to discuss. I called her back and lvm advising her that since she was seen by Dr. Denyse Amass she should speak with his nurse Karel Jarvis. Otherwise if this is a question specifically for Dr. Linford Arnold to call back to speak with me.Loralee Pacas [place]

## 2015-07-29 ENCOUNTER — Telehealth: Payer: Self-pay

## 2015-07-29 NOTE — Telephone Encounter (Signed)
Pt called stating that the rx for cipro and flagyl  she received at her last office visit gave her heartburn so she stopped taking it. She states that since stopping the medication she if having diarrhea. She is feeling better but wants to know if it is normal for her to be experiencing this. Please advise.

## 2015-07-30 LAB — STOOL CULTURE

## 2015-07-30 NOTE — Progress Notes (Signed)
Quick Note:  Normal, no changes. ______ 

## 2015-07-30 NOTE — Telephone Encounter (Signed)
Stool culture was negative. She does not have to take the medicine at all. Return if not getting better

## 2015-07-30 NOTE — Telephone Encounter (Signed)
Pt notified of recommendations. Stated that she is feeling better but still having diarrhea after eating. I advised her to com in for an office visit if she is still experiencing this on Monday.

## 2015-07-31 ENCOUNTER — Ambulatory Visit: Payer: BLUE CROSS/BLUE SHIELD | Admitting: Sports Medicine

## 2015-08-14 ENCOUNTER — Ambulatory Visit
Admission: RE | Admit: 2015-08-14 | Discharge: 2015-08-14 | Disposition: A | Discharge status: Home / Self Care | Payer: BLUE CROSS/BLUE SHIELD | Source: Ambulatory Visit | Attending: Sports Medicine | Admitting: Sports Medicine

## 2015-08-14 VITALS — BP 140/89 | HR 74

## 2015-08-14 DIAGNOSIS — M5412 Radiculopathy, cervical region: Secondary | ICD-10-CM

## 2015-08-14 MED ORDER — TRIAMCINOLONE ACETONIDE 40 MG/ML IJ SUSP (RADIOLOGY)
60.0000 mg | Freq: Once | INTRAMUSCULAR | Status: AC
  Administered 2015-08-14: 60 mg via EPIDURAL

## 2015-08-14 MED ORDER — IOHEXOL 300 MG/ML  SOLN
1.0000 mL | Freq: Once | INTRAMUSCULAR | Status: DC | PRN
  Administered 2015-08-14: 1 mL via EPIDURAL

## 2015-08-14 MED ORDER — DIAZEPAM 5 MG PO TABS
5.0000 mg | ORAL_TABLET | Freq: Once | ORAL | Status: AC
  Administered 2015-08-14: 5 mg via ORAL

## 2015-08-14 NOTE — Discharge Instructions (Signed)

## 2015-08-31 ENCOUNTER — Encounter: Payer: Self-pay | Admitting: Sports Medicine

## 2015-08-31 ENCOUNTER — Ambulatory Visit (INDEPENDENT_AMBULATORY_CARE_PROVIDER_SITE_OTHER): Payer: BLUE CROSS/BLUE SHIELD | Admitting: Sports Medicine

## 2015-08-31 VITALS — BP 141/90 | HR 76 | Ht 63.0 in | Wt 121.0 lb

## 2015-08-31 DIAGNOSIS — M5030 Other cervical disc degeneration, unspecified cervical region: Secondary | ICD-10-CM | POA: Diagnosis not present

## 2015-08-31 DIAGNOSIS — G43109 Migraine with aura, not intractable, without status migrainosus: Secondary | ICD-10-CM

## 2015-08-31 DIAGNOSIS — G43909 Migraine, unspecified, not intractable, without status migrainosus: Secondary | ICD-10-CM | POA: Diagnosis not present

## 2015-08-31 MED ORDER — TOPIRAMATE 50 MG PO TABS: 50.0000 mg | ORAL_TABLET | Freq: Every day | ORAL | Status: DC

## 2015-08-31 MED ORDER — TRAMADOL HCL 50 MG PO TABS: ORAL_TABLET | ORAL | Status: DC

## 2015-08-31 NOTE — Progress Notes (Signed)
  Subjective:    CC: Follow-up after her epidural  HPI: Cervical degenerative disc disease: Fantastic response to cervical epidural with good response and axial and complete response of left-sided radicular pain, unfortunately starting to have some right-sided radicular pain.  Migraines: Resolved with Topamax.  Past medical history, Surgical history, Family history not pertinant except as noted below, Social history, Allergies, and medications have been entered into the medical record, reviewed, and no changes needed.   Review of Systems: No fevers, chills, night sweats, weight loss, chest pain, or shortness of breath.   Objective:    General: Well Developed, well nourished, and in no acute distress.  Neuro: Alert and oriented x3, extra-ocular muscles intact, sensation grossly intact.  HEENT: Normocephalic, atraumatic, pupils equal round reactive to light, neck supple, no masses, no lymphadenopathy, thyroid nonpalpable.  Skin: Warm and dry, no rashes. Cardiac: Regular rate and rhythm, no murmurs rubs or gallops, no lower extremity edema.  Respiratory: Clear to auscultation bilaterally. Not using accessory muscles, speaking in full sentences. Neck: Negative spurling's Full neck range of motion Grip strength and sensation normal in bilateral hands Strength good C4 to T1 distribution No sensory change to C4 to T1 Reflexes normal  Impression and Recommendations:   I spent 40 minutes with this patient, greater than 50% was face-to-face time counseling regarding the above diagnoses

## 2015-08-31 NOTE — Assessment & Plan Note (Signed)
Doing extremely well, no further migraines. Continue Topamax 50 mg daily

## 2015-08-31 NOTE — Assessment & Plan Note (Signed)
Fantastic initial response to left-sided cervical epidural, left-sided paresthesias and pain are resolved. Starting to have right-sided C7 radiculopathy. She does have 2 large disc protrusions, we are going to proceed with a right-sided cervical epidural. Return in one month after, referral for 2 level ACDF if no better. Tramadol for pain.

## 2015-09-07 ENCOUNTER — Ambulatory Visit (INDEPENDENT_AMBULATORY_CARE_PROVIDER_SITE_OTHER): Payer: BLUE CROSS/BLUE SHIELD | Admitting: Neurology

## 2015-09-07 ENCOUNTER — Encounter: Payer: Self-pay | Admitting: Neurology

## 2015-09-07 VITALS — BP 138/92 | HR 82 | Ht 63.0 in | Wt 119.0 lb

## 2015-09-07 DIAGNOSIS — M79601 Pain in right arm: Secondary | ICD-10-CM

## 2015-09-07 DIAGNOSIS — R202 Paresthesia of skin: Secondary | ICD-10-CM

## 2015-09-07 NOTE — Progress Notes (Signed)
Chief Complaint  Patient presents with  . Migraine    She was placed on a combination of topiramate and phentermine by Dr. Dianah Field to help with her migraines.  . Numbness    She is still having numbness in various areas, along burning sensations now.  . Cervical Pain    She is being treated with epidural steroid injections for her bulging discs.      PATIENT: Melinda Parsons DOB: 09/17/1971  Chief Complaint  Patient presents with  . Migraine    She was placed on a combination of topiramate and phentermine by Dr. Dianah Field to help with her migraines.  . Numbness    She is still having numbness in various areas, along burning sensations now.  . Cervical Pain    She is being treated with epidural steroid injections for her bulging discs.    HISTORICAL  Melinda Parsons is a 44 years old left-handed female, accompanied by her husband, seen referred by her primary care physician Melinda Parsons for left facial numbness, MRI brain result.  In June first 2016, she noticed left ear turned cold, later she also noticed intermittent numbness tingling involving her left ear, over the past 3 weeks, she also noticed intermittent numbness involving her left cheek, occasionally left jawline, spare her left forehead, the left ear numbness tingling can sometimes progress to mild burning pain, she denied rash mild blurry vision on the left side, no hearing change.  In addition, she also noticed intermittent bilateral hands, feet paresthesia, 2 weeks history of constipation, urinary urgency, occasionally bowel accident. She denies gait difficulty,  We have reviewed laboratory since May 2016, normal CMP, CBC, TSH, A1c, ESR,   we also reviewed MRI of the brain in June 2016, mild supratentorium nonspecific white matter changes, more on the left side, most consistent with small vessel disease.  Patient also complains of few years history of intermittent headaches, occasionally typical migraine,  preceded by left visual aura, followed by severe lateralized headaches, with associated light noise sensitivity, recent 1 year, she no longer has her typical severe migraine, but continue have mild to moderate weekly headaches.  UPDATE Sep 07 2015: She complains of right shoulder pain, radiating pain to her right forearm since August 2016, especially at the right radial forearm, difficult to move about, worsening pain by palpation, numbness from right elbow down  She also complains bilateral feet intermittent numbness, burning pain, she denies significant gait difficulty She had cervical epidural injection without improving her symptoms.  We have reviewed MRI of cervical spine in July 2016: C5-6: disc bulging and facet hypertrophy with mild spinal stenosis and severe biforaminal stenosis; no cord signal changes C6-7: disc bulging and facet hypertrophy with mild spinal stenosis or foraminal narrowing; no cord signal changes  REVIEW OF SYSTEMS: Full 14 system review of systems performed and notable only for as above ALLERGIES: No Known Allergies  HOME MEDICATIONS: Current Outpatient Prescriptions  Medication Sig Dispense Refill  . DULoxetine (CYMBALTA) 30 MG capsule Take 1 capsule (30 mg total) by mouth daily. 30 capsule 3  . etonogestrel-ethinyl estradiol (NUVARING) 0.12-0.015 MG/24HR vaginal ring Place 1 each vaginally every 28 (twenty-eight) days. Insert vaginally and leave in place for 3 consecutive weeks, then remove for 1 week.     . fexofenadine (ALLEGRA) 180 MG tablet Take 180 mg by mouth daily.    . magnesium oxide (MAG-OX) 400 MG tablet Take 2 tablets (800 mg total) by mouth 2 (two) times daily. 180 tablet 3  PAST MEDICAL HISTORY: Past Medical History  Diagnosis Date  . Pre-eclampsia     with daughter  . GERD (gastroesophageal reflux disease)   . PVC (premature ventricular contraction)     Previous holter in Dec 2011 showed PVCs and 2 3 beat runs of SVT  . Hypertension     . IBS (irritable bowel syndrome)   . Migraine   . Lumbar herniated disc   . Seasonal allergies   . Anxiety     PAST SURGICAL HISTORY: Past Surgical History  Procedure Laterality Date  . No past surgeries      FAMILY HISTORY: Family History  Problem Relation Age of Onset  . Hypertension Mother   . Hyperlipidemia Mother   . Hypertension      grandmother  . Alcohol abuse      grandparent  . Diverticulitis Father   . Breast cancer Mother 26    SOCIAL HISTORY:  Social History   Social History  . Marital Status: Married    Spouse Name: N/A  . Number of Children: 1  . Years of Education: 16   Occupational History  . Administrator, sports.  Vf Engineer, agricultural   Social History Main Topics  . Smoking status: Never Smoker   . Smokeless tobacco: Not on file  . Alcohol Use: 0.6 - 1.2 oz/week    1-2 Standard drinks or equivalent per week     Comment: Socially  . Drug Use: No  . 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   Other Topics Concern  . Not on file   Social History Narrative   Lives at home with her husband and daughter.   Left-handed.   4-6 cups caffeine per day.     PHYSICAL EXAM   Filed Vitals:   09/07/15 1557  BP: 138/92  Pulse: 82  Height: '5\' 3"'  (1.6 m)  Weight: 119 lb (53.978 kg)    Not recorded      Body mass index is 21.09 kg/(m^2).  PHYSICAL EXAMNIATION:  Gen: NAD, conversant, well nourised, obese, well groomed                     Cardiovascular: Regular rate rhythm, no peripheral edema, warm, nontender. Eyes: Conjunctivae clear without exudates or hemorrhage Neck: Supple, no carotid bruise. Pulmonary: Clear to auscultation bilaterally   NEUROLOGICAL EXAM:  MENTAL STATUS: Speech:    Speech is normal; fluent and spontaneous with normal comprehension.  Cognition:    The patient is oriented to person, place, and time;      recent and remote memory intact;     language fluent;     normal attention, concentration,     fund of knowledge.  CRANIAL NERVES: CN II: Visual fields are full to confrontation. Fundoscopic exam is normal with sharp discs and no vascular changes. Pupils were equal round reactive to light CN III, IV, VI: extraocular movement are normal. No ptosis. CN V: Facial sensation is intact to pinprick in all 3 divisions bilaterally. Corneal responses are intact.  CN VII: Face is symmetric with normal eye closure and smile. CN VIII: Hearing is normal to rubbing fingers CN IX, X: Palate elevates symmetrically. Phonation is normal. CN XI: Head turning and shoulder shrug are intact CN XII: Tongue is midline with normal movements and no atrophy.  MOTOR: There is no pronator drift of out-stretched  arms. Muscle bulk and tone are normal. Muscle strength is normal.  REFLEXES: Reflexes are 2+ and symmetric at the biceps, triceps, knees, and ankles. Plantar responses are flexor.  SENSORY: Light touch, pinprick, position sense, and vibration sense are intact in fingers and toes.  COORDINATION: Rapid alternating movements and fine finger movements are intact. There is no dysmetria on finger-to-nose and heel-knee-shin. There are no abnormal or extraneous movements.   GAIT/STANCE: She tends to hold her right arm in right elbow flexion,  DIAGNOSTIC DATA (LABS, IMAGING, TESTING) - I reviewed patient records, labs, notes, testing and imaging myself where available.   ASSESSMENT AND PLAN  Melinda Parsons is a 44 y.o. female with 3 weeks history of left ear, left cheek paresthesia, intermittent bilateral hands, feet paresthesia, normal neurological examination, we have reviewed MRI of brain, mild supratentorium nonspecific white matter changes, most consistent with her history of migraine, small vessel disease.  Right shoulder pain, right forearm pain, paresthesia,  Unsure etiology, differentiation diagnosis  includes right upper extremity neuropathy due to vasculitis, need to rule out vascular etiology  EMG nerve conduction study  Doppler study of right upper extremity  Laboratory evaluation for inflammatory markers.  Marcial Pacas, M.D. Ph.D.  [facility] [address], [place], [place] [address] Ph: [phone] Fax: [phone]

## 2015-09-08 ENCOUNTER — Telehealth: Payer: Self-pay | Admitting: Neurology

## 2015-09-08 DIAGNOSIS — E538 Deficiency of other specified B group vitamins: Secondary | ICD-10-CM

## 2015-09-08 LAB — C-REACTIVE PROTEIN: CRP: 1.4 mg/L (ref 0.0–4.9)

## 2015-09-08 LAB — HIV ANTIBODY (ROUTINE TESTING W REFLEX): HIV Screen 4th Generation wRfx: NONREACTIVE

## 2015-09-08 LAB — VITAMIN B12: VITAMIN B 12: 195 pg/mL — AB (ref 211–946)

## 2015-09-08 LAB — HEPATITIS PANEL, ACUTE
HEP A IGM: NEGATIVE
HEP B C IGM: NEGATIVE
HEP B S AG: NEGATIVE

## 2015-09-08 LAB — ANA W/REFLEX IF POSITIVE: Anti Nuclear Antibody(ANA): NEGATIVE

## 2015-09-08 LAB — CK: Total CK: 96 U/L (ref 24–173)

## 2015-09-08 LAB — RPR: RPR: NONREACTIVE

## 2015-09-08 LAB — SEDIMENTATION RATE: Sed Rate: 5 mm/hr (ref 0–32)

## 2015-09-08 NOTE — Telephone Encounter (Signed)
Left message to return my call.  

## 2015-09-08 NOTE — Telephone Encounter (Signed)
Michele: Please call patient, laboratory evaluation showed a low vitamin B12, rest of the laboratory was normal  She needs to have repeat B12, folic acid level, Methylmalonic acid and homocystine, order was placed, after repeat lab, she need right B12 supplement, 1000 g IM every day for 1 week, every week for one month, and then every months.

## 2015-09-09 ENCOUNTER — Other Ambulatory Visit (INDEPENDENT_AMBULATORY_CARE_PROVIDER_SITE_OTHER): Payer: Self-pay

## 2015-09-09 ENCOUNTER — Other Ambulatory Visit: Payer: Self-pay | Admitting: *Deleted

## 2015-09-09 ENCOUNTER — Telehealth: Payer: Self-pay

## 2015-09-09 DIAGNOSIS — E538 Deficiency of other specified B group vitamins: Secondary | ICD-10-CM

## 2015-09-09 DIAGNOSIS — Z0289 Encounter for other administrative examinations: Secondary | ICD-10-CM

## 2015-09-09 MED ORDER — CYANOCOBALAMIN 1000 MCG/ML IJ SOLN
1000.0000 ug | INTRAMUSCULAR | Status: DC
  Administered 2015-09-24 – 2015-10-07 (×3): 1000 ug via INTRAMUSCULAR

## 2015-09-09 MED ORDER — CYANOCOBALAMIN 1000 MCG/ML IJ SOLN: 1000.0000 ug | Freq: Every day | INTRAMUSCULAR | Status: DC

## 2015-09-09 MED ORDER — CYANOCOBALAMIN 1000 MCG/ML IJ SOLN: 1000.0000 ug | INTRAMUSCULAR | Status: DC

## 2015-09-09 NOTE — Telephone Encounter (Signed)
Spoke to patient - she will come in for labs this week.  She will be starting her B-12 injections, as ordered, on 09/14/15.

## 2015-09-09 NOTE — Telephone Encounter (Signed)
Patient called wanted to advise that she has started back taking the phentermine. She had stopped it in July but she stated that she was told by PCP that the phentermine and Topamax will help with her pain so she has started back on it. Rhonda Cunningham,CMA

## 2015-09-09 NOTE — Telephone Encounter (Signed)
Patient is returning a call. °

## 2015-09-10 ENCOUNTER — Other Ambulatory Visit: Payer: Self-pay | Admitting: *Deleted

## 2015-09-10 ENCOUNTER — Ambulatory Visit (INDEPENDENT_AMBULATORY_CARE_PROVIDER_SITE_OTHER): Payer: BLUE CROSS/BLUE SHIELD | Admitting: *Deleted

## 2015-09-10 ENCOUNTER — Telehealth: Payer: Self-pay | Admitting: Neurology

## 2015-09-10 DIAGNOSIS — E538 Deficiency of other specified B group vitamins: Secondary | ICD-10-CM

## 2015-09-10 DIAGNOSIS — M79601 Pain in right arm: Secondary | ICD-10-CM | POA: Insufficient documentation

## 2015-09-10 LAB — FOLATE: FOLATE: 17.8 ng/mL (ref >3.0)

## 2015-09-10 LAB — VITAMIN B12: VITAMIN B 12: 247 pg/mL (ref 211–946)

## 2015-09-10 MED ORDER — CYANOCOBALAMIN 1000 MCG/ML IJ SOLN
1000.0000 ug | Freq: Every day | INTRAMUSCULAR | Status: AC
  Administered 2015-09-14 – 2015-09-17 (×4): 1000 ug via INTRAMUSCULAR

## 2015-09-10 MED ORDER — CYANOCOBALAMIN 1000 MCG/ML IJ SOLN
1000.0000 ug | Freq: Once | INTRAMUSCULAR | Status: AC
  Administered 2015-09-10: 1000 ug via INTRAMUSCULAR

## 2015-09-10 NOTE — Telephone Encounter (Signed)
Melinda Parsons, please call patient, repeat laboratory showed low B12 247, which is different from her previous level of 195,  She should continue vitamin B12 IM supplement as previously planned,  I have ordered Doppler study of her right arm, she may also try hot compression, when necessary NSAIDs,

## 2015-09-10 NOTE — Telephone Encounter (Signed)
Patient aware of results - she will proceed with B12 injections.  She will also try NSAIDS and hot compression.  She is agreeable to having a doppler study and is aware to expect the call from our referral team to schedule.

## 2015-09-10 NOTE — Telephone Encounter (Signed)
Pt states arms in more pain than a couple days ago. The pain is in rt arm to extend or reaching behind to back progressively  getting worse daily. She was instructed to call if symptoms got worse. Please call and advise. Patient can be reached at [phone].

## 2015-09-14 ENCOUNTER — Ambulatory Visit (INDEPENDENT_AMBULATORY_CARE_PROVIDER_SITE_OTHER): Payer: BLUE CROSS/BLUE SHIELD | Admitting: *Deleted

## 2015-09-14 DIAGNOSIS — E538 Deficiency of other specified B group vitamins: Secondary | ICD-10-CM | POA: Diagnosis not present

## 2015-09-14 LAB — METHYLMALONIC ACID(MMA), RND URINE
CREATININE(CRT), U: 0.34 g/L (ref 0.30–3.00)
METHYLMALONIC ACID UR: 2.9 umol/L (ref 1.6–29.7)
MMA - NORMALIZED: 1 umol/mmol{creat} (ref 0.4–2.5)

## 2015-09-14 NOTE — Progress Notes (Signed)
Pt here for B 12 injection.  She stated that after her first injection over the weekend she stated that she felt some moody, depressed, and that her veins stood out.  This has happened before and she asked if could be the Vit b 12.  I told her that I did not think the B12 would cause these sx but she should see how she does after today.  She had these sx previously before starting injections.   Under aseptic technique cyanocobalamin 101mcg/1ml IM given R deltoid.  Tolerated well.  Bandaid applied.

## 2015-09-15 ENCOUNTER — Ambulatory Visit (INDEPENDENT_AMBULATORY_CARE_PROVIDER_SITE_OTHER): Payer: BLUE CROSS/BLUE SHIELD | Admitting: *Deleted

## 2015-09-15 DIAGNOSIS — E538 Deficiency of other specified B group vitamins: Secondary | ICD-10-CM

## 2015-09-15 NOTE — Progress Notes (Signed)
Pt here for B 12 injection.  Under aseptic technique cyanocobalamin 1021mcg/1ml IM given L deltoid.  Tolerated well.  Bandaid applied.   Pt stated that she had to do warm bath for cramps that she noted.  ? Injection?  Will see tomorrow.

## 2015-09-16 ENCOUNTER — Other Ambulatory Visit: Payer: Self-pay | Admitting: *Deleted

## 2015-09-16 ENCOUNTER — Ambulatory Visit (INDEPENDENT_AMBULATORY_CARE_PROVIDER_SITE_OTHER): Payer: BLUE CROSS/BLUE SHIELD | Admitting: *Deleted

## 2015-09-16 ENCOUNTER — Telehealth: Payer: Self-pay | Admitting: *Deleted

## 2015-09-16 DIAGNOSIS — M79601 Pain in right arm: Secondary | ICD-10-CM

## 2015-09-16 DIAGNOSIS — E538 Deficiency of other specified B group vitamins: Secondary | ICD-10-CM

## 2015-09-16 MED ORDER — GABAPENTIN 100 MG PO CAPS: ORAL_CAPSULE | ORAL | Status: DC

## 2015-09-16 NOTE — Telephone Encounter (Signed)
Patient arrived to the office today for her B12 injection.  Complained of right arm pain and cramps in her bilateral legs at night.  Per vo by Dr. Terrace Arabia, rx provided for gabapentin  qhs.  She will keep all her follow up appointment for tests, B12 injections and revisits as scheduled.

## 2015-09-17 ENCOUNTER — Encounter: Payer: Self-pay | Admitting: *Deleted

## 2015-09-17 ENCOUNTER — Ambulatory Visit (INDEPENDENT_AMBULATORY_CARE_PROVIDER_SITE_OTHER): Payer: BLUE CROSS/BLUE SHIELD | Admitting: *Deleted

## 2015-09-17 DIAGNOSIS — E538 Deficiency of other specified B group vitamins: Secondary | ICD-10-CM

## 2015-09-17 NOTE — Progress Notes (Signed)
Pt here for B 12 injection.  Under aseptic technique cyanocobalamin 1022mcg/1ml IM given L deltoid.  Tolerated well.  Bandaid applied.   Pt stated that she has not picked up her medication that was called in yesterday as yet.   She did have cramping all over and had bruising on R toe because of this, and had humming sensation in her toes this am.  She stated she has been on a medication called hyosyamine ER 0.375 mg tab, taking 1 tab daily.   She had not told you this before.  Asking about drug interactions with other medications (including gabapentin).

## 2015-09-21 ENCOUNTER — Other Ambulatory Visit: Payer: BLUE CROSS/BLUE SHIELD

## 2015-09-21 ENCOUNTER — Ambulatory Visit
Admission: RE | Admit: 2015-09-21 | Discharge: 2015-09-21 | Disposition: A | Discharge status: Home / Self Care | Payer: BLUE CROSS/BLUE SHIELD | Source: Ambulatory Visit | Attending: Sports Medicine | Admitting: Sports Medicine

## 2015-09-21 MED ORDER — IOHEXOL 300 MG/ML  SOLN
1.0000 mL | Freq: Once | INTRAMUSCULAR | Status: DC | PRN
Start: 2015-09-21 — End: 2015-09-22
  Administered 2015-09-21: 1 mL via EPIDURAL

## 2015-09-21 MED ORDER — TRIAMCINOLONE ACETONIDE 40 MG/ML IJ SUSP (RADIOLOGY)
60.0000 mg | Freq: Once | INTRAMUSCULAR | Status: AC
  Administered 2015-09-21: 60 mg via EPIDURAL

## 2015-09-21 NOTE — Discharge Instructions (Signed)

## 2015-09-23 ENCOUNTER — Encounter: Payer: BLUE CROSS/BLUE SHIELD | Admitting: Neurology

## 2015-09-24 ENCOUNTER — Ambulatory Visit (INDEPENDENT_AMBULATORY_CARE_PROVIDER_SITE_OTHER): Payer: BLUE CROSS/BLUE SHIELD | Admitting: *Deleted

## 2015-09-24 DIAGNOSIS — E538 Deficiency of other specified B group vitamins: Secondary | ICD-10-CM | POA: Diagnosis not present

## 2015-09-24 NOTE — Patient Instructions (Signed)
See next week.  Pt will call to schedule appt.

## 2015-09-24 NOTE — Progress Notes (Signed)
Pt here for B 12 injection.  Under aseptic technique cyanocobalamin 1000mcg/1ml IM given R deltoid.  Tolerated well.  Bandaid applied.  

## 2015-09-29 ENCOUNTER — Ambulatory Visit (INDEPENDENT_AMBULATORY_CARE_PROVIDER_SITE_OTHER): Payer: Self-pay | Admitting: Neurology

## 2015-09-29 ENCOUNTER — Ambulatory Visit (INDEPENDENT_AMBULATORY_CARE_PROVIDER_SITE_OTHER): Payer: BLUE CROSS/BLUE SHIELD | Admitting: Neurology

## 2015-09-29 ENCOUNTER — Ambulatory Visit (INDEPENDENT_AMBULATORY_CARE_PROVIDER_SITE_OTHER): Payer: BLUE CROSS/BLUE SHIELD | Admitting: *Deleted

## 2015-09-29 DIAGNOSIS — E538 Deficiency of other specified B group vitamins: Secondary | ICD-10-CM | POA: Diagnosis not present

## 2015-09-29 DIAGNOSIS — Z0289 Encounter for other administrative examinations: Secondary | ICD-10-CM

## 2015-09-29 DIAGNOSIS — M79601 Pain in right arm: Secondary | ICD-10-CM

## 2015-09-29 DIAGNOSIS — R202 Paresthesia of skin: Secondary | ICD-10-CM

## 2015-09-29 NOTE — Procedures (Signed)
   NCS (NERVE CONDUCTION STUDY) WITH EMG (ELECTROMYOGRAPHY) REPORT   STUDY DATE: September 29 2015 PATIENT NAME: Courtney HeysSharon Boggan DOB: 10/27/1971 MRN: [id number]    TECHNOLOGIST: Gearldine ShownLorraine Jones ELECTROMYOGRAPHER: Levert FeinsteinYan, Veron Senner M.D.  CLINICAL INFORMATION:  44 years old right-handed female, complains of neck pain, right posterior forearm strain sensation.  FINDINGS: NERVE CONDUCTION STUDY: Right peroneal sensory response was normal. Bilateral median, ulnar sensory and motor responses were normal. Right peroneal to EDB, right tibial motor responses were normal. Right tibial H reflexe was present.  NEEDLE ELECTROMYOGRAPHY: Selected needle examination was performed at right upper extremity, and right cervical paraspinal muscles.  Needle examination of right extensor digitorum communis, biceps, triceps, deltoid, pronator teres, first dorsal interossei was normal.  There was no spontaneous activity at right cervical paraspinal muscles, right C5, 6, 7.  IMPRESSION:  This is a normal study. There was no electrodiagnostic evidence of right upper extremity neuropathy, or right cervical radiculopathy.   INTERPRETING PHYSICIAN:   Levert FeinsteinYan, Graceanne Guin M.D. Ph.D. [facility] [address], [address] [place], [place] [address] [phone]

## 2015-09-29 NOTE — Progress Notes (Signed)
EMG nerve conduction study September 29 2015 was normal, there was no evidence of right upper extremity neuropathy or right cervical radiculopathy.

## 2015-09-29 NOTE — Progress Notes (Signed)
Pt here for B 12 injection.  Under aseptic technique cyanocobalamin 1000mcg/1ml IM given L Deltoid.   Tolerated well.  Bandaid applied.  

## 2015-09-29 NOTE — Patient Instructions (Signed)
See next week

## 2015-10-07 ENCOUNTER — Ambulatory Visit (INDEPENDENT_AMBULATORY_CARE_PROVIDER_SITE_OTHER): Payer: BLUE CROSS/BLUE SHIELD | Admitting: *Deleted

## 2015-10-07 ENCOUNTER — Encounter: Payer: BLUE CROSS/BLUE SHIELD | Admitting: Neurology

## 2015-10-07 DIAGNOSIS — E538 Deficiency of other specified B group vitamins: Secondary | ICD-10-CM | POA: Diagnosis not present

## 2015-10-07 NOTE — Patient Instructions (Signed)
See next week

## 2015-10-07 NOTE — Progress Notes (Signed)
Pt here for B 12 injection.  Under aseptic technique cyanocobalamin 1000mcg/1ml IM given L deltoid.  Tolerated well.  Bandaid applied.  

## 2015-10-08 ENCOUNTER — Ambulatory Visit (INDEPENDENT_AMBULATORY_CARE_PROVIDER_SITE_OTHER): Payer: BLUE CROSS/BLUE SHIELD | Admitting: Sports Medicine

## 2015-10-08 ENCOUNTER — Encounter: Payer: Self-pay | Admitting: Sports Medicine

## 2015-10-08 DIAGNOSIS — M5030 Other cervical disc degeneration, unspecified cervical region: Secondary | ICD-10-CM | POA: Diagnosis not present

## 2015-10-08 DIAGNOSIS — E538 Deficiency of other specified B group vitamins: Secondary | ICD-10-CM | POA: Diagnosis not present

## 2015-10-08 MED ORDER — PREGABALIN 75 MG PO CAPS: 75.0000 mg | ORAL_CAPSULE | Freq: Two times a day (BID) | ORAL | Status: DC

## 2015-10-08 MED ORDER — VITAMIN B-12 1000 MCG PO TABS: 1000.0000 ug | ORAL_TABLET | Freq: Every day | ORAL | Status: DC

## 2015-10-08 NOTE — Progress Notes (Signed)
  Subjective:    CC: Follow-up after epidural  HPI: Cervical degenerative disc disease: Melinda [name] returns, she is a pleasant 44 year old female, she has moderate to level cervical degenerative disc disease, initially she responded very well to her left-sided cervical radiculopathy with a left-sided C7-T1 interlaminar epidural, subsequent she had some right-sided neck, shoulder, pain, with numbness over the dorsal right forearm, unfortunately a right-sided cervical epidural was not sufficiently effective. Pain is moderate, persistent.  Vitamin B12 deficiency: No evidence of pernicious anemia on CBC, B12 deficiency was mild methylmalonic acid levels were normal, she wonders if we can discontinue parenteral B12 supplementation.  Feet numbness: Bilateral, somewhat migratory, brain MRI showed nonspecific white matter disease consistent with migraines, and she has seen the neurologist multiple times, recent nerve conduction and EMG was negative. She was placed on gabapentin with mild improvement in her lower extremity paresthesias. Rheumatologic and metabolic workup has been negative.  Past medical history, Surgical history, Family history not pertinant except as noted below, Social history, Allergies, and medications have been entered into the medical record, reviewed, and no changes needed.   Review of Systems: No fevers, chills, night sweats, weight loss, chest pain, or shortness of breath.   Objective:    General: Well Developed, well nourished, and in no acute distress.  Neuro: Alert and oriented x3, extra-ocular muscles intact, sensation grossly intact.  HEENT: Normocephalic, atraumatic, pupils equal round reactive to light, neck supple, no masses, no lymphadenopathy, thyroid nonpalpable.  Skin: Warm and dry, no rashes. Cardiac: Regular rate and rhythm, no murmurs rubs or gallops, no lower extremity edema.  Respiratory: Clear to auscultation bilaterally. Not using accessory muscles, speaking in  full sentences.  Impression and Recommendations:   I spent 40 minutes with this patient, greater than 50% was face-to-face time counseling regarding the above diagnoses

## 2015-10-08 NOTE — Assessment & Plan Note (Signed)
Fantastic response to left-sided cervical epidural with resolution of left-sided paresthesias. Right-sided cervical epidural did not provide much relief, and she continues to have some right dorsal forearm numbness, she does have 2 large protrusions and I would like her to talk to Dr. Yevette Edwardsumonski for consideration of surgical intervention. She did have a negative nerve conduction and EMG. Interestingly she also has some lower extremity stocking distribution paresthesias, gabapentin seemed to help, she also had a rheumatologic workup performed by neurology that was essentially negative. Her going to switch her to Lyrica and up titrate the dose slowly in the meantime. Return to see me in one month.

## 2015-10-08 NOTE — Assessment & Plan Note (Addendum)
Vitamin B-12 deficiency that has been corrected, no evidence of pernicious anemia so we will discontinue parenteral supplementation and replete B12 orally.

## 2015-10-09 ENCOUNTER — Telehealth: Payer: Self-pay | Admitting: Sports Medicine

## 2015-10-09 NOTE — Telephone Encounter (Signed)
Try lyrica for a solid week, if still no relief of symptoms then yes we can switch back but the initial day or so can be like this.  Relief may come but I promise to switch back to gabapentin after giving the due diligence to lyrica.

## 2015-10-09 NOTE — Telephone Encounter (Signed)
Patient called into the office this morning stating that she started on the Lyrica last night and got no pain relief at all.  She was up and down all night with severe cramping.  She took another Lyrica this morning and is very disoriented, doesn't even know how she made it to work.  She's not comfortable taking the Lyrica due to feeling disoriented and not having any pain relief.  She was on Gabapentin before, didn't know if that would be an option.   Please call patient back with options of what can be done.

## 2015-10-12 NOTE — Telephone Encounter (Signed)
Patient states she feels "drunk" while on the medication. She doesn't even feel comfortable driving while on medication.

## 2015-10-12 NOTE — Progress Notes (Signed)
I have reviewed and agreed above plan. 

## 2015-10-13 NOTE — Telephone Encounter (Signed)
Have her simply go back to the gabapentin.

## 2015-10-13 NOTE — Telephone Encounter (Signed)
Patient complains of pain in her arms. She is very frustrated because she cannot do the activities she has done in the past, such as workout. She is taking gabapentin 100 mg 3 qhs.

## 2015-10-14 ENCOUNTER — Telehealth: Payer: Self-pay | Admitting: Family Medicine

## 2015-10-14 ENCOUNTER — Ambulatory Visit: Payer: Self-pay

## 2015-10-14 NOTE — Telephone Encounter (Signed)
Pt called. She wants to get an overview of her current  problem and how long she can expect to have this problem.

## 2015-10-14 NOTE — Telephone Encounter (Signed)
I would recommend she increase by 100mg  weekly until symptoms better controlled, restart with the previous gabapentin dose.

## 2015-10-14 NOTE — Telephone Encounter (Signed)
DR. Karie Schwalbe, PLEASE SEE NOTE BELOW. Rawad Bochicchio,CMA

## 2015-10-14 NOTE — Telephone Encounter (Signed)
Patient advised.

## 2015-10-15 NOTE — Telephone Encounter (Signed)
This is most likely a myofascial pain syndrome, there is no cure for this, just as there is no cure for diabetes or high blood pressure however we can manage it with medications and physical activity. It often comes and goes in flares, there will be good days, there will be bad days, good weeks, bad weeks, but ultimately nerve blocking medications like gabapentin are the best treatment.  Overall there is nothing life or limb threatening.we will do our best to control her symptoms when they do arise. How are things going with gabapentin, in the office visit she told me they were improving on gabapentin.

## 2015-10-19 ENCOUNTER — Telehealth: Payer: Self-pay | Admitting: Neurology

## 2015-10-19 NOTE — Telephone Encounter (Signed)
MRI of the cervical was done at [facility], please let [facility] note, they can access film through Epic system

## 2015-10-19 NOTE — Telephone Encounter (Signed)
[facility] called, states patient that Dr. Terrace ArabiaYan referred is there now but they don't have MRI report for cervical spine. Can see in [facility] but it states [facility] to read.

## 2015-10-20 ENCOUNTER — Encounter: Payer: BLUE CROSS/BLUE SHIELD | Admitting: Neurology

## 2015-10-20 NOTE — Telephone Encounter (Signed)
Patient has been informed she stated that Gabapentin is working for her. Aleighya Mcanelly,CMA

## 2015-10-20 NOTE — Telephone Encounter (Signed)
Returned call to [facility] to let them know the MRI is available in Epic.

## 2015-11-09 ENCOUNTER — Ambulatory Visit (INDEPENDENT_AMBULATORY_CARE_PROVIDER_SITE_OTHER): Payer: BLUE CROSS/BLUE SHIELD | Admitting: Sports Medicine

## 2015-11-09 DIAGNOSIS — R635 Abnormal weight gain: Secondary | ICD-10-CM

## 2015-11-09 DIAGNOSIS — M5030 Other cervical disc degeneration, unspecified cervical region: Secondary | ICD-10-CM

## 2015-11-09 MED ORDER — GABAPENTIN 600 MG PO TABS: 600.0000 mg | ORAL_TABLET | Freq: Every day | ORAL | Status: DC

## 2015-11-09 MED ORDER — PHENTERMINE HCL 15 MG PO CAPS: 15.0000 mg | ORAL_CAPSULE | ORAL | Status: DC

## 2015-11-09 NOTE — Progress Notes (Signed)
  Subjective:    CC: Follow-up  HPI: Cervical degenerative disc disease: Did see Dr. Yevette Edwardsumonski who recommended further physical therapy, is having some right-sided paresthesias, and radiculopathy. She has responded well with regards to her left-sided radiculopathy with a left-sided epidural but her right-sided cervical epidural did not provide much relief. She is agreeable to try a second opinion.  Myofascial pain syndrome: This is been a difficult discussion with Melinda Parsons, we experienced excessive sedation with Lyrica, and recently started gabapentin with a better response, currently at 400 mg per day. Minimal sedation. She discontinued prescribe SYMPTOMS and multiple locations including widespread pain, cramping. She has seen an urologist and has had nerve conduction studies as well as a fairly extensive serum workup.   Past medical history, Surgical history, Family history not pertinant except as noted below, Social history, Allergies, and medications have been entered into the medical record, reviewed, and no changes needed.   Review of Systems: No fevers, chills, night sweats, weight loss, chest pain, or shortness of breath.   Objective:    General: Well Developed, well nourished, and in no acute distress.  Neuro: Alert and oriented x3, extra-ocular muscles intact, sensation grossly intact.  HEENT: Normocephalic, atraumatic, pupils equal round reactive to light, neck supple, no masses, no lymphadenopathy, thyroid nonpalpable.  Skin: Warm and dry, no rashes. Cardiac: Regular rate and rhythm, no murmurs rubs or gallops, no lower extremity edema.  Respiratory: Clear to auscultation bilaterally. Not using accessory muscles, speaking in full sentences.  Impression and Recommendations:    I spent 40 minutes with this patient, greater than 50% was face-to-face time counseling regarding the above diagnoses

## 2015-11-09 NOTE — Assessment & Plan Note (Signed)
Per patient phentermine is the only thing that has provided significant relief. Starting low-dose phentermine.  Continue Topamax.

## 2015-11-09 NOTE — Assessment & Plan Note (Addendum)
Fantastic response to left-sided cervical epidural with resolution of left-sided paresthesias. Right-sided cervical epidural did not provide much relief, and she continues to have some right dorsal forearm numbness, she does have 2 large protrusions and I would like her to talk to Dr. Yevette Edwardsumonski for consideration of surgical intervention. She did have a negative nerve conduction and EMG. Interestingly she also has some lower extremity stocking distribution paresthesias, gabapentin seemed to help, she also had a rheumatologic workup performed by neurology that was essentially negative. Continue gabapentin up taper. There likely is an element of myofascial pain syndrome here. We did discuss the natural history. Saw Dr. Yevette Edwardsumonski, does not think this is a cervical process Second [facility].

## 2015-11-10 ENCOUNTER — Other Ambulatory Visit: Payer: Self-pay | Admitting: Sports Medicine

## 2015-11-10 DIAGNOSIS — G43109 Migraine with aura, not intractable, without status migrainosus: Secondary | ICD-10-CM

## 2015-11-10 MED ORDER — TOPIRAMATE 50 MG PO TABS: 50.0000 mg | ORAL_TABLET | Freq: Every day | ORAL | Status: DC

## 2015-11-13 ENCOUNTER — Other Ambulatory Visit: Payer: Self-pay | Admitting: Neurology

## 2015-11-13 NOTE — Telephone Encounter (Signed)
Dr Monica Bectonhomas J Thekkekandam, MD changed dose to 600mg  and sent Rx on 11/28

## 2015-12-08 ENCOUNTER — Ambulatory Visit (INDEPENDENT_AMBULATORY_CARE_PROVIDER_SITE_OTHER): Payer: BLUE CROSS/BLUE SHIELD | Admitting: Sports Medicine

## 2015-12-08 ENCOUNTER — Encounter: Payer: Self-pay | Admitting: Sports Medicine

## 2015-12-08 VITALS — BP 131/90 | HR 108 | Temp 97.8°F | Resp 18 | Wt 114.6 lb

## 2015-12-08 DIAGNOSIS — M5030 Other cervical disc degeneration, unspecified cervical region: Secondary | ICD-10-CM | POA: Diagnosis not present

## 2015-12-08 DIAGNOSIS — R635 Abnormal weight gain: Secondary | ICD-10-CM | POA: Diagnosis not present

## 2015-12-08 NOTE — Progress Notes (Signed)
  Subjective:    CC: follow-up  HPI: Myofascial pain syndrome: Worsening of symptoms now that she had a death in her family, her father passed away. Phentermine does continue to help, she is only lost 3 pounds which is good. Gabapentin also continues to help. She does have an appointment coming up with neurosurgery next week.  Past medical history, Surgical history, Family history not pertinant except as noted below, Social history, Allergies, and medications have been entered into the medical record, reviewed, and no changes needed.   Review of Systems: No fevers, chills, night sweats, weight loss, chest pain, or shortness of breath.   Objective:    General: Well Developed, well nourished, and in no acute distress.  Neuro: Alert and oriented x3, extra-ocular muscles intact, sensation grossly intact.  HEENT: Normocephalic, atraumatic, pupils equal round reactive to light, neck supple, no masses, no lymphadenopathy, thyroid nonpalpable.  Skin: Warm and dry, no rashes. Cardiac: Regular rate and rhythm, no murmurs rubs or gallops, no lower extremity edema.  Respiratory: Clear to auscultation bilaterally. Not using accessory muscles, speaking in full sentences.  Impression and Recommendations:    I spent 25 minutes with this patient, greater than 50% was face-to-face time counseling regarding the above diagnoses

## 2015-12-08 NOTE — Assessment & Plan Note (Signed)
Appointment coming up with neurosurgery next week

## 2015-12-08 NOTE — Assessment & Plan Note (Signed)
3 pound weight change on low-dose phentermine.  She does have an element of myofascial pain syndrome, phentermine seems to help it, I did advise that we would continue this for 6 months total, and then likely consider a different stimulant that we can do longer term, possibly Adderall.

## 2015-12-30 ENCOUNTER — Ambulatory Visit: Payer: BLUE CROSS/BLUE SHIELD | Admitting: Neurology

## 2016-01-11 ENCOUNTER — Telehealth: Payer: Self-pay | Admitting: *Deleted

## 2016-01-11 NOTE — Telephone Encounter (Signed)
Called pt to verify updated insurance information  Member ID:  [id number] Rx bin:  [id number] Group #:  [id number]  .Melinda Parsons

## 2016-01-15 NOTE — Telephone Encounter (Signed)
Information was submitted for authorization on Phentermine. Received information back from insurance company, Rx was denied until Pt tries and fails their preferred formulary. Will put information in ordering providers box for review.

## 2016-02-09 ENCOUNTER — Encounter: Payer: Self-pay | Admitting: Sports Medicine

## 2016-02-09 ENCOUNTER — Ambulatory Visit (INDEPENDENT_AMBULATORY_CARE_PROVIDER_SITE_OTHER): Payer: BLUE CROSS/BLUE SHIELD | Admitting: Sports Medicine

## 2016-02-09 VITALS — BP 141/94 | HR 103 | Resp 18 | Wt 113.5 lb

## 2016-02-09 DIAGNOSIS — M5030 Other cervical disc degeneration, unspecified cervical region: Secondary | ICD-10-CM | POA: Diagnosis not present

## 2016-02-09 DIAGNOSIS — R635 Abnormal weight gain: Secondary | ICD-10-CM | POA: Diagnosis not present

## 2016-02-09 MED ORDER — PHENTERMINE HCL 15 MG PO CAPS: 15.0000 mg | ORAL_CAPSULE | ORAL | Status: DC

## 2016-02-09 NOTE — Assessment & Plan Note (Signed)
Has been seen by both orthopedic spine surgery as well as neurosurgery, no operative intervention planned, currently undergoing B12 treatment with neurology.  Symptoms are overall tolerable at this point.

## 2016-02-09 NOTE — Progress Notes (Signed)
  Subjective:    CC: Follow-up  HPI: Cervical radiculitis, neck pain, myofascial pain syndrome: Seems to be controlled with current medications.  Past medical history, Surgical history, Family history not pertinant except as noted below, Social history, Allergies, and medications have been entered into the medical record, reviewed, and no changes needed.   Review of Systems: No fevers, chills, night sweats, weight loss, chest pain, or shortness of breath.   Objective:    General: Well Developed, well nourished, and in no acute distress.  Neuro: Alert and oriented x3, extra-ocular muscles intact, sensation grossly intact.  HEENT: Normocephalic, atraumatic, pupils equal round reactive to light, neck supple, no masses, no lymphadenopathy, thyroid nonpalpable.  Skin: Warm and dry, no rashes. Cardiac: Regular rate and rhythm, no murmurs rubs or gallops, no lower extremity edema.  Respiratory: Clear to auscultation bilaterally. Not using accessory muscles, speaking in full sentences.  Impression and Recommendations:

## 2016-02-09 NOTE — Assessment & Plan Note (Signed)
Doing well on phentermine, ultimately it seems to be oddly the only thing that's helped her myofascial pain symptoms. wewill continue the low-dose for now.

## 2016-02-09 NOTE — Addendum Note (Signed)
Addended by: Baird Kay on: 02/09/2016 04:54 PM   Modules accepted: Medications

## 2016-02-10 ENCOUNTER — Telehealth: Payer: Self-pay

## 2016-02-10 NOTE — Telephone Encounter (Signed)
She doesn't meet any of the criteria for it to be covered even with a letter, it is however generic and cheap so she should just offer to pay cash for it.

## 2016-02-10 NOTE — Telephone Encounter (Signed)
Patient would like a letter of medical necessity for phentermine. Please advise.    The Case # [id number]

## 2016-02-11 NOTE — Telephone Encounter (Signed)
Left message advising of recommendations.  

## 2016-04-28 ENCOUNTER — Other Ambulatory Visit: Payer: Self-pay

## 2016-04-28 DIAGNOSIS — G43109 Migraine with aura, not intractable, without status migrainosus: Secondary | ICD-10-CM

## 2016-04-28 DIAGNOSIS — M5030 Other cervical disc degeneration, unspecified cervical region: Secondary | ICD-10-CM

## 2016-04-28 MED ORDER — GABAPENTIN 600 MG PO TABS: 600.0000 mg | ORAL_TABLET | Freq: Every day | ORAL | Status: DC

## 2016-04-28 MED ORDER — TOPIRAMATE 50 MG PO TABS: 50.0000 mg | ORAL_TABLET | Freq: Every day | ORAL | Status: DC

## 2016-05-10 ENCOUNTER — Encounter: Payer: Self-pay | Admitting: Sports Medicine

## 2016-05-10 ENCOUNTER — Ambulatory Visit (INDEPENDENT_AMBULATORY_CARE_PROVIDER_SITE_OTHER): Payer: BLUE CROSS/BLUE SHIELD | Admitting: Sports Medicine

## 2016-05-10 VITALS — BP 116/82 | HR 101 | Resp 18 | Wt 113.4 lb

## 2016-05-10 DIAGNOSIS — M79674 Pain in right toe(s): Secondary | ICD-10-CM

## 2016-05-10 DIAGNOSIS — R635 Abnormal weight gain: Secondary | ICD-10-CM | POA: Diagnosis not present

## 2016-05-10 MED ORDER — PHENTERMINE HCL 15 MG PO CAPS: 15.0000 mg | ORAL_CAPSULE | ORAL | Status: DC

## 2016-05-10 NOTE — Progress Notes (Signed)
  Subjective:    CC: Follow-up  HPI: Toe and foot pain: Seems to be controlled, she stays hydrated, but does note that red wine and meats can precipitate her pain, we've never checked a uric acid level.  Myofascial pain: Seems to be controlled with phentermine only, we are not using it for weight loss, her weight has remained stable and her blood pressure is well controlled. I have agreed in the past to provide this long-term at the very low dose.  Past medical history, Surgical history, Family history not pertinant except as noted below, Social history, Allergies, and medications have been entered into the medical record, reviewed, and no changes needed.   Review of Systems: No fevers, chills, night sweats, weight loss, chest pain, or shortness of breath.   Objective:    General: Well Developed, well nourished, and in no acute distress.  Neuro: Alert and oriented x3, extra-ocular muscles intact, sensation grossly intact.  HEENT: Normocephalic, atraumatic, pupils equal round reactive to light, neck supple, no masses, no lymphadenopathy, thyroid nonpalpable.  Skin: Warm and dry, no rashes. Cardiac: Regular rate and rhythm, no murmurs rubs or gallops, no lower extremity edema.  Respiratory: Clear to auscultation bilaterally. Not using accessory muscles, speaking in full sentences. Foot: Some change in coloration highly consistent with Raynaud phenomenon.  Impression and Recommendations:    I spent 25 minutes with this patient, greater than 50% was face-to-face time counseling regarding the above diagnoses

## 2016-05-10 NOTE — Assessment & Plan Note (Signed)
Checking uric acid levels. She does endorse red wine and meats as a precipitator of her pain. Also refilling phentermine, low-dose phentermine has seemed to be the only thing that controls her myofascial pain. Her weight is stable and vitals are stable. She does have a bit of Raynaud disease, this does not bother her so we will treat for now.

## 2016-05-11 LAB — CBC
HCT: 42.5 % (ref 35.0–45.0)
Hemoglobin: 13.7 g/dL (ref 11.7–15.5)
MCH: 29.8 pg (ref 27.0–33.0)
MCHC: 32.2 g/dL (ref 32.0–36.0)
MCV: 92.6 fL (ref 80.0–100.0)
MPV: 9.6 fL (ref 7.5–12.5)
Platelets: 286 10*3/uL (ref 140–400)
RBC: 4.59 MIL/uL (ref 3.80–5.10)
RDW: 12.8 % (ref 11.0–15.0)
WBC: 4.9 K/uL (ref 3.8–10.8)

## 2016-05-11 LAB — URIC ACID: Uric Acid, Serum: 3.1 mg/dL (ref 2.4–7.0)

## 2016-05-11 LAB — COMPREHENSIVE METABOLIC PANEL WITH GFR
AST: 12 U/L (ref 10–30)
Albumin: 4.1 g/dL (ref 3.6–5.1)
Creat: 0.7 mg/dL (ref 0.50–1.10)
Sodium: 141 mmol/L (ref 135–146)
Total Bilirubin: 0.3 mg/dL (ref 0.2–1.2)
Total Protein: 6.7 g/dL (ref 6.1–8.1)

## 2016-05-11 LAB — COMPREHENSIVE METABOLIC PANEL
ALT: 10 U/L (ref 6–29)
Alkaline Phosphatase: 36 U/L (ref 33–115)
BUN: 12 mg/dL (ref 7–25)
CO2: 24 mmol/L (ref 20–31)
Calcium: 8.6 mg/dL (ref 8.6–10.2)
Chloride: 106 mmol/L (ref 98–110)
Glucose, Bld: 79 mg/dL (ref 65–99)
Potassium: 4.4 mmol/L (ref 3.5–5.3)

## 2016-09-26 ENCOUNTER — Encounter: Payer: Self-pay | Admitting: Family Medicine

## 2016-10-03 ENCOUNTER — Other Ambulatory Visit: Payer: Self-pay | Admitting: *Deleted

## 2016-10-03 DIAGNOSIS — E538 Deficiency of other specified B group vitamins: Secondary | ICD-10-CM

## 2016-10-03 MED ORDER — VITAMIN B-12 1000 MCG PO TABS: 1000.0000 ug | ORAL_TABLET | Freq: Every day | ORAL | 0 refills | Status: DC

## 2016-10-03 NOTE — Progress Notes (Signed)
Request for 90 day supply of B!2. orig rx written for 1 year. Patient needs to have f/u labs. Has been a little over a year

## 2016-10-05 ENCOUNTER — Encounter: Payer: Self-pay | Admitting: Sports Medicine

## 2016-10-05 ENCOUNTER — Ambulatory Visit (INDEPENDENT_AMBULATORY_CARE_PROVIDER_SITE_OTHER): Payer: BLUE CROSS/BLUE SHIELD

## 2016-10-05 ENCOUNTER — Ambulatory Visit (INDEPENDENT_AMBULATORY_CARE_PROVIDER_SITE_OTHER): Payer: BLUE CROSS/BLUE SHIELD | Admitting: Sports Medicine

## 2016-10-05 DIAGNOSIS — G5602 Carpal tunnel syndrome, left upper limb: Secondary | ICD-10-CM | POA: Diagnosis not present

## 2016-10-05 DIAGNOSIS — F329 Major depressive disorder, single episode, unspecified: Secondary | ICD-10-CM

## 2016-10-05 DIAGNOSIS — R0781 Pleurodynia: Secondary | ICD-10-CM | POA: Diagnosis not present

## 2016-10-05 DIAGNOSIS — F418 Other specified anxiety disorders: Secondary | ICD-10-CM | POA: Diagnosis not present

## 2016-10-05 DIAGNOSIS — R05 Cough: Secondary | ICD-10-CM | POA: Diagnosis not present

## 2016-10-05 DIAGNOSIS — F419 Anxiety disorder, unspecified: Secondary | ICD-10-CM

## 2016-10-05 DIAGNOSIS — R059 Cough, unspecified: Secondary | ICD-10-CM | POA: Insufficient documentation

## 2016-10-05 DIAGNOSIS — F32A Depression, unspecified: Secondary | ICD-10-CM | POA: Insufficient documentation

## 2016-10-05 MED ORDER — VORTIOXETINE HBR 10 MG PO TABS: 1.0000 | ORAL_TABLET | Freq: Every day | ORAL | 3 refills | Status: DC

## 2016-10-05 MED ORDER — FLUTICASONE PROPIONATE 50 MCG/ACT NA SUSP: NASAL | 3 refills | Status: DC

## 2016-10-05 MED ORDER — BENZONATATE 200 MG PO CAPS: 200.0000 mg | ORAL_CAPSULE | Freq: Three times a day (TID) | ORAL | 0 refills | Status: DC | PRN

## 2016-10-05 MED ORDER — AZITHROMYCIN 250 MG PO TABS: ORAL_TABLET | ORAL | 0 refills | Status: DC

## 2016-10-05 NOTE — Assessment & Plan Note (Signed)
Starting Trintellix. Return in one month for a GAD7 and PHQ9

## 2016-10-05 NOTE — Addendum Note (Signed)
Addended by: Monica BectonHEKKEKANDAM, Shanta Dorvil J on: 10/05/2016 12:49 PM   Modules accepted: Orders

## 2016-10-05 NOTE — Progress Notes (Signed)
  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 She endorses moderate depressed mood, poor energy, and mild anhedonia, difficulty sleeping, guilt, difficulty concentrating, no suicidal or homicidal ideation, does however have severe difficulty controlling her worry, irritability, moderate nervousness, trouble relaxing and fear of impending doom.  Coughing: Present for a month now, nonproductive, she has some pain in her left ribs, she has had some weight loss however this is associated with her depression. No fevers, chills, night sweats. She does have somewhat of a runny nose.  Left hand numbness: Present in the first 3 fingers, worse during the day and when gripping objects. Moderate, persistent.  Past medical history:  Negative.  See flowsheet/record as well for more information.  Surgical history: Negative.  See flowsheet/record as well for more information.  Family history: Negative.  See flowsheet/record as well for more information.  Social history: Negative.  See flowsheet/record as well for more information.  Allergies, and medications have been entered into the medical record, reviewed, and no changes needed.   Review of Systems: No fevers, chills, night sweats, weight loss, chest pain, or shortness of breath.   Objective:    General: Well Developed, well nourished, and in no acute distress.  Neuro: Alert and oriented x3, extra-ocular muscles intact, sensation grossly intact.  HEENT: Normocephalic, atraumatic, pupils equal round reactive to light, neck supple, no masses, no lymphadenopathy, thyroid nonpalpable.  Skin: Warm and dry, no rashes. Cardiac: Regular rate and rhythm, no murmurs rubs or gallops, no lower extremity edema.  Respiratory: Clear to  auscultation bilaterally. Not using accessory muscles, speaking in full sentences.There is tenderness on the left chest wall in the intercostals. No palpable crepitus, step-off. Left Wrist: Inspection normal with no visible erythema or swelling. ROM smooth and normal with good flexion and extension and ulnar/radial deviation that is symmetrical with opposite wrist. Palpation is normal over metacarpals, navicular, lunate, and TFCC; tendons without tenderness/ swelling No snuffbox tenderness. No tenderness over Canal of Guyon. Strength 5/5 in all directions without pain. Negative Finkelstein Positive Tinel's and Phalen signs Negative Watson's test.   Impression and Recommendations:    Carpal tunnel syndrome, left Nocturnal splinting. Return in one month.  Anxiety and depression Starting Trintellix. Return in one month for a GAD7 and PHQ9  Cough Exam is benign, she does have some left intercostal tenderness, we are going to get a rib series. This likely represents postnasal drip. Adding Flonase, continue Allegra. Return in one month.  I spent 40 minutes with this patient, greater than 50% was face-to-face time counseling regarding the above diagnoses

## 2016-10-05 NOTE — Assessment & Plan Note (Signed)
Nocturnal splinting. Return in one month.

## 2016-10-05 NOTE — Assessment & Plan Note (Signed)
Exam is benign, she does have some left intercostal tenderness, we are going to get a rib series. This likely represents postnasal drip. Adding Flonase, continue Allegra. Return in one month.

## 2016-10-10 ENCOUNTER — Telehealth: Payer: Self-pay

## 2016-10-10 NOTE — Telephone Encounter (Signed)
Medication is not covered and co pay is too high. Please assist.

## 2016-10-10 NOTE — Telephone Encounter (Signed)
Needs to run it through the discount coupon, also please check with Melinda Parsons to see if a PA is being worked on. Worse case scenario she needs [facility] with [facility].

## 2016-10-11 ENCOUNTER — Other Ambulatory Visit: Payer: Self-pay | Admitting: Sports Medicine

## 2016-10-11 DIAGNOSIS — E538 Deficiency of other specified B group vitamins: Secondary | ICD-10-CM

## 2016-10-11 NOTE — Telephone Encounter (Signed)
A PA has been submitted but will most likely be denied. If the patient has not tried and failed any other SSRI's this med, more than likely, is not going to be covered by any insurance because it's a newer medication. If it is covered then step therapy will be a requirement.

## 2016-10-11 NOTE — Telephone Encounter (Signed)
We have already tried and failed Cymbalta, does this count as step therapy?

## 2016-10-12 NOTE — Telephone Encounter (Signed)
No, not cymbalta, but I did see that you mention in your note OBGYN placed her on Lexapro so that was mentioned in appeal. Looks like they want her to have tried Paroxetine and FLuoxetine citolopram. Ill let you know if a different rx needs to be sent after everything is reviewed by insurance

## 2016-10-12 NOTE — Telephone Encounter (Signed)
Thank you Sue Lushndrea.  Insurance companies just don't understand the need to use an SNRI rather than an SSRI.

## 2016-10-12 NOTE — Telephone Encounter (Signed)
Yes agreed

## 2016-10-13 ENCOUNTER — Encounter: Payer: Self-pay | Admitting: *Deleted

## 2016-10-13 NOTE — Progress Notes (Signed)
Patient didn't remember the strength of the lexapro

## 2016-10-14 MED ORDER — SERTRALINE HCL 25 MG PO TABS: 25.0000 mg | ORAL_TABLET | Freq: Every day | ORAL | 2 refills | Status: DC

## 2016-10-14 NOTE — Telephone Encounter (Signed)
Adding sertraline.

## 2016-10-14 NOTE — Addendum Note (Signed)
Addended by: Monica BectonHEKKEKANDAM, THOMAS J on: 10/14/2016 09:10 AM   Modules accepted: Orders

## 2016-10-14 NOTE — Telephone Encounter (Signed)
Pt called asking for something since medication is not approved. States her symptoms are still there if not worse. Please assist.

## 2016-10-14 NOTE — Telephone Encounter (Signed)
Left detailed message with call back information in case of questions.  

## 2016-10-28 ENCOUNTER — Ambulatory Visit: Payer: BLUE CROSS/BLUE SHIELD | Admitting: Sports Medicine

## 2016-11-11 ENCOUNTER — Ambulatory Visit (INDEPENDENT_AMBULATORY_CARE_PROVIDER_SITE_OTHER): Payer: BLUE CROSS/BLUE SHIELD | Admitting: Sports Medicine

## 2016-11-11 ENCOUNTER — Encounter: Payer: Self-pay | Admitting: Sports Medicine

## 2016-11-11 DIAGNOSIS — F32A Depression, unspecified: Secondary | ICD-10-CM

## 2016-11-11 DIAGNOSIS — G5602 Carpal tunnel syndrome, left upper limb: Secondary | ICD-10-CM | POA: Diagnosis not present

## 2016-11-11 DIAGNOSIS — R635 Abnormal weight gain: Secondary | ICD-10-CM | POA: Diagnosis not present

## 2016-11-11 DIAGNOSIS — F418 Other specified anxiety disorders: Secondary | ICD-10-CM | POA: Diagnosis not present

## 2016-11-11 DIAGNOSIS — F419 Anxiety disorder, unspecified: Principal | ICD-10-CM

## 2016-11-11 DIAGNOSIS — F329 Major depressive disorder, single episode, unspecified: Secondary | ICD-10-CM

## 2016-11-11 MED ORDER — SERTRALINE HCL 25 MG PO TABS: 25.0000 mg | ORAL_TABLET | Freq: Every day | ORAL | 3 refills | Status: DC

## 2016-11-11 MED ORDER — PHENTERMINE HCL 15 MG PO CAPS: 15.0000 mg | ORAL_CAPSULE | ORAL | 3 refills | Status: DC

## 2016-11-11 NOTE — Assessment & Plan Note (Signed)
Improved significantly with nighttime splinting, starting to move into the right side, she will get bilateral splints.

## 2016-11-11 NOTE — Progress Notes (Signed)
  Subjective:    CC: Follow-up  HPI: Carpal tunnel syndrome: Left-sided symptoms are improving with nighttime splinting, she is started to have right-sided symptoms. Pain on the left is not bad enough to consider hydrodissection at this time.  Depression: Improve significantly with 25 mg of Zoloft, we were unable to get Trintellix approved, now has only mild trouble staying asleep, poor energy, and guilt, no suicidal or homicidal ideation, all questions on the GAD7 questionnaire are negative.  Past medical history:  Negative.  See flowsheet/record as well for more information.  Surgical history: Negative.  See flowsheet/record as well for more information.  Family history: Negative.  See flowsheet/record as well for more information.  Social history: Negative.  See flowsheet/record as well for more information.  Allergies, and medications have been entered into the medical record, reviewed, and no changes needed.   Review of Systems: No fevers, chills, night sweats, weight loss, chest pain, or shortness of breath.   Objective:    General: Well Developed, well nourished, and in no acute distress.  Neuro: Alert and oriented x3, extra-ocular muscles intact, sensation grossly intact.  HEENT: Normocephalic, atraumatic, pupils equal round reactive to light, neck supple, no masses, no lymphadenopathy, thyroid nonpalpable.  Skin: Warm and dry, no rashes. Cardiac: Regular rate and rhythm, no murmurs rubs or gallops, no lower extremity edema.  Respiratory: Clear to auscultation bilaterally. Not using accessory muscles, speaking in full sentences.  Impression and Recommendations:    Anxiety and depression Fantastic improvements on sertraline, no changes yet.  Carpal tunnel syndrome, left Improved significantly with nighttime splinting, starting to move into the right side, she will get bilateral splints.  I spent 25 minutes with this patient, greater than 50% was face-to-face time counseling  regarding the above diagnoses

## 2016-11-11 NOTE — Assessment & Plan Note (Signed)
Fantastic improvements on sertraline, no changes yet.

## 2016-12-07 ENCOUNTER — Telehealth: Payer: Self-pay

## 2016-12-07 DIAGNOSIS — G5602 Carpal tunnel syndrome, left upper limb: Secondary | ICD-10-CM

## 2016-12-07 NOTE — Telephone Encounter (Signed)
Pt would like to know if she can increase her sertraline. Please advise.

## 2016-12-07 NOTE — Telephone Encounter (Signed)
Left VM with information.  

## 2016-12-07 NOTE — Telephone Encounter (Signed)
Yes, increase to 50 mg, take 2 of the 25 mg tabs until she runs out, and I can call in the 50 mg pills.

## 2017-01-02 ENCOUNTER — Encounter (INDEPENDENT_AMBULATORY_CARE_PROVIDER_SITE_OTHER): Payer: Self-pay

## 2017-01-02 ENCOUNTER — Ambulatory Visit (INDEPENDENT_AMBULATORY_CARE_PROVIDER_SITE_OTHER): Payer: BLUE CROSS/BLUE SHIELD | Admitting: Family Medicine

## 2017-01-02 ENCOUNTER — Ambulatory Visit (INDEPENDENT_AMBULATORY_CARE_PROVIDER_SITE_OTHER): Payer: BLUE CROSS/BLUE SHIELD

## 2017-01-02 ENCOUNTER — Encounter: Payer: Self-pay | Admitting: Family Medicine

## 2017-01-02 VITALS — BP 119/76 | HR 87 | Ht 69.0 in | Wt 111.0 lb

## 2017-01-02 DIAGNOSIS — R1013 Epigastric pain: Secondary | ICD-10-CM | POA: Diagnosis not present

## 2017-01-02 DIAGNOSIS — R1011 Right upper quadrant pain: Secondary | ICD-10-CM | POA: Diagnosis not present

## 2017-01-02 DIAGNOSIS — R1012 Left upper quadrant pain: Secondary | ICD-10-CM | POA: Diagnosis not present

## 2017-01-02 DIAGNOSIS — I700 Atherosclerosis of aorta: Secondary | ICD-10-CM | POA: Insufficient documentation

## 2017-01-02 MED ORDER — IOPAMIDOL (ISOVUE-300) INJECTION 61%
100.0000 mL | Freq: Once | INTRAVENOUS | Status: AC | PRN
  Administered 2017-01-02: 100 mL via INTRAVENOUS

## 2017-01-02 NOTE — Progress Notes (Signed)
Subjective:    CC: right sided flank pain since October.    HPI:   Right sided flank pain since October.  No known injury or trauma. Started around the time she was treated with bronchitis.  Occ pain on her left side. Thought could be indigestion. Says TUMS helps some. No N/V, sweats or chills.  Having some urgency with stooling. Had some recent swelling in her feet with recent travel with flying.  She says it is better than it was, but had never had this happen before. Had flown to [place].  Painful with deep breaths.    No urinary sxs. No blood in the stool  She says she is really not noticed any specific triggers. No worsening or alleviating factors. She says it happens several times a week and feels like a spasm or grabbing sensation. Eating regularly.  Reviewed results from chest x-ray done back in the fall. He was noted that she had some atherosclerosis of the aorta. She was not aware of this.  BP 119/76   Pulse 87   Ht 5\' 9"  (1.753 m)   Wt 111 lb (50.3 kg)   SpO2 100%   BMI 16.39 kg/m     No Known Allergies  Past Medical History:  Diagnosis Date  . Anxiety   . GERD (gastroesophageal reflux disease)   . Hypertension   . IBS (irritable bowel syndrome)   . Lumbar herniated disc   . Migraine   . Pre-eclampsia    with daughter  . PVC (premature ventricular contraction)    Previous holter in Dec 2011 showed PVCs and 2 3 beat runs of SVT  . Seasonal allergies     Past Surgical History:  Procedure Laterality Date  . NO PAST SURGERIES      Social History   Social History  . Marital status: Married    Spouse name: N/A  . Number of children: 1  . Years of education: 24   Occupational History  . Optician, dispensing.  [employer]   Social History Main Topics  . Smoking status: Never Smoker  . Smokeless tobacco: Never Used  . Alcohol use 0.6 - 1.2 oz/week    1 - 2 Standard drinks or equivalent per week     Comment: Socially  .  Drug use: No  . 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   Other Topics Concern  . Not on file   Social History Narrative   Lives at home with her husband and daughter.   Left-handed.   4-6 cups caffeine per day.    Family History  Problem Relation Age of Onset  . Hypertension Mother   . Hyperlipidemia Mother   . Breast cancer Mother 61  . Diverticulitis Father   . Hypertension      grandmother  . Alcohol abuse      grandparent    Outpatient Encounter Prescriptions as of 01/02/2017  Medication Sig  . fexofenadine (ALLEGRA) 180 MG tablet Take 180 mg by mouth daily.  . fluticasone (FLONASE) 50 MCG/ACT nasal spray One spray in each nostril twice a day, use left hand for right nostril, and right hand for left nostril.  Marland Kitchen gabapentin (NEURONTIN) 600 MG tablet Take 1 tablet (600 mg total) by mouth at bedtime.  . hyoscyamine (LEVBID) 0.375 MG 12 hr tablet Take 0.375 mg by mouth 2 (two)  times daily. She is taking 1 tablet daily  . sertraline (ZOLOFT) 25 MG tablet   . topiramate (TOPAMAX) 50 MG tablet Take 1 tablet (50 mg total) by mouth at bedtime.  . vitamin B-12 (CYANOCOBALAMIN) 1000 MCG tablet TAKE 1 TABLET (1,000 MCG TOTAL) BY MOUTH DAILY.  . [DISCONTINUED] benzonatate (TESSALON) 200 MG capsule Take 1 capsule (200 mg total) by mouth 3 (three) times daily as needed for cough.  . [DISCONTINUED] phentermine 15 MG capsule Take 1 capsule (15 mg total) by mouth every morning.  . [DISCONTINUED] sertraline (ZOLOFT) 50 MG tablet Take 1 tablet (50 mg total) by mouth daily.   No facility-administered encounter medications on file as of 01/02/2017.        Review of Systems: No fevers, chills, night sweats, weight loss, chest pain, or shortness of breath. Also c/o of pain in her right leg last night.    Objective:    General: Well Developed, well nourished, and in no acute distress.  Neuro: Alert and oriented  x3, extra-ocular muscles intact, sensation grossly intact.  HEENT: Normocephalic, atraumatic  Skin: Warm and dry, no rashes. Cardiac: Regular rate and rhythm, no murmurs rubs or gallops, no lower extremity edema.  Respiratory: Clear to auscultation bilaterally. Not using accessory muscles, speaking in full sentences. Abd: thin and float, no distention or bloating. Very tender in the left upper quadrant, epigastric and right upper quadrant area. Nontender in the lower portion of the abdomen. Tender over the right side between the ribs and the hip. Nontender over the hip itself and nontender in the axilla on the right side. Tender on the left side.   Impression and Recommendations:    Right and left upper quadrant pain and tenderness, abdomen-unclear etiology at this point. I am concerned that this is been going on for 4 months and that she does have significant tenderness on exam today in the right upper quadrant, epigastric and left upper quadrant areas. Will get CT abdomen pelvis for further evaluation. Consider cholecystitis.  Consider obstipation. Adhesions are unlikely since she's had no prior abdominal surgeries.  Ankle swelling and recent CXR shows atherosclerosis - will get echo for further evaluation as well. Get lipid panel.    Atherosclerosis, aorta-we'll better wrist stratify with a up-to-date lipid panel. Her last one was in November 2012 and at that time her LDL was 106. She says she does have a family history of high cholesterol.

## 2017-01-03 LAB — COMPLETE METABOLIC PANEL WITH GFR
ALBUMIN: 3.9 g/dL (ref 3.6–5.1)
ALK PHOS: 61 U/L (ref 33–115)
ALT: 10 U/L (ref 6–29)
AST: 14 U/L (ref 10–30)
BUN: 8 mg/dL (ref 7–25)
CALCIUM: 9.1 mg/dL (ref 8.6–10.2)
CO2: 26 mmol/L (ref 20–31)
CREATININE: 0.75 mg/dL (ref 0.50–1.10)
Chloride: 102 mmol/L (ref 98–110)
GFR, Est African American: >89 mL/min (ref >=60)
GFR, Est Non African American: >89 mL/min (ref >=60)
Glucose, Bld: 87 mg/dL (ref 65–99)
Potassium: 4.2 mmol/L (ref 3.5–5.3)
Sodium: 139 mmol/L (ref 135–146)
Total Bilirubin: 0.6 mg/dL (ref 0.2–1.2)
Total Protein: 6.8 g/dL (ref 6.1–8.1)

## 2017-01-03 LAB — CBC WITH DIFFERENTIAL/PLATELET
Basophils Absolute: 79 cells/uL (ref 0–200)
Basophils Relative: 1 %
EOS PCT: 2 %
Eosinophils Absolute: 158 cells/uL (ref 15–500)
HEMATOCRIT: 42.8 % (ref 35.0–45.0)
Hemoglobin: 14 g/dL (ref 11.7–15.5)
LYMPHS PCT: 24 %
Lymphs Abs: 1896 cells/uL (ref 850–3900)
MCH: 30.7 pg (ref 27.0–33.0)
MCHC: 32.7 g/dL (ref 32.0–36.0)
MCV: 93.9 fL (ref 80.0–100.0)
MONO ABS: 553 {cells}/uL (ref 200–950)
MPV: 10 fL (ref 7.5–12.5)
Monocytes Relative: 7 %
NEUTROS PCT: 66 %
Neutro Abs: 5214 cells/uL (ref 1500–7800)
Platelets: 281 10*3/uL (ref 140–400)
RBC: 4.56 MIL/uL (ref 3.80–5.10)
RDW: 12.6 % (ref 11.0–15.0)
WBC: 7.9 10*3/uL (ref 3.8–10.8)

## 2017-01-03 LAB — LIPID PANEL
Cholesterol: 139 mg/dL (ref <200)
HDL: 58 mg/dL (ref >50)
LDL CALC: 61 mg/dL (ref <100)
TRIGLYCERIDES: 100 mg/dL (ref <150)
Total CHOL/HDL Ratio: 2.4 Ratio (ref <5.0)
VLDL: 20 mg/dL (ref <30)

## 2017-01-11 ENCOUNTER — Telehealth: Payer: Self-pay | Admitting: *Deleted

## 2017-01-11 MED ORDER — OSELTAMIVIR PHOSPHATE 75 MG PO CAPS: 75.0000 mg | ORAL_CAPSULE | Freq: Two times a day (BID) | ORAL | 0 refills | Status: DC

## 2017-01-11 NOTE — Telephone Encounter (Signed)
Pt called and lvm asking about her echo that was placed when she was here on 01/02/17. She hasn't heard anything. Also she said that her daughter was dx w/flu.Melinda PacasBarkley, Parys Elenbaas KellertonLynetta

## 2017-01-11 NOTE — Telephone Encounter (Signed)
Called pt back and informed her that [facility] And sent rx to pharmacy.Loralee PacasBarkley, Lorain Keast HumphreyLynetta

## 2017-01-19 ENCOUNTER — Encounter (HOSPITAL_COMMUNITY): Payer: Self-pay

## 2017-01-19 ENCOUNTER — Ambulatory Visit (HOSPITAL_COMMUNITY)
Admission: RE | Admit: 2017-01-19 | Discharge: 2017-01-19 | Disposition: A | Discharge status: Home / Self Care | Payer: BLUE CROSS/BLUE SHIELD | Source: Ambulatory Visit | Attending: Family Medicine | Admitting: Family Medicine

## 2017-01-19 DIAGNOSIS — I351 Nonrheumatic aortic (valve) insufficiency: Secondary | ICD-10-CM | POA: Diagnosis not present

## 2017-01-19 DIAGNOSIS — I700 Atherosclerosis of aorta: Secondary | ICD-10-CM | POA: Insufficient documentation

## 2017-01-19 NOTE — Progress Notes (Signed)
  Echocardiogram 2D Echocardiogram has been performed.  Melinda SavoyCasey N [name] 01/19/2017, 4:12 PM

## 2017-01-20 ENCOUNTER — Ambulatory Visit (INDEPENDENT_AMBULATORY_CARE_PROVIDER_SITE_OTHER): Payer: BLUE CROSS/BLUE SHIELD | Admitting: Family Medicine

## 2017-01-20 ENCOUNTER — Ambulatory Visit (INDEPENDENT_AMBULATORY_CARE_PROVIDER_SITE_OTHER): Payer: BLUE CROSS/BLUE SHIELD

## 2017-01-20 ENCOUNTER — Encounter: Payer: Self-pay | Admitting: Family Medicine

## 2017-01-20 VITALS — BP 114/72 | HR 93 | Ht 69.0 in | Wt 111.0 lb

## 2017-01-20 DIAGNOSIS — M79671 Pain in right foot: Secondary | ICD-10-CM

## 2017-01-20 DIAGNOSIS — M7918 Myalgia, other site: Secondary | ICD-10-CM

## 2017-01-20 DIAGNOSIS — S92314A Nondisplaced fracture of first metatarsal bone, right foot, initial encounter for closed fracture: Secondary | ICD-10-CM

## 2017-01-20 DIAGNOSIS — M791 Myalgia: Secondary | ICD-10-CM | POA: Diagnosis not present

## 2017-01-20 DIAGNOSIS — R079 Chest pain, unspecified: Secondary | ICD-10-CM | POA: Diagnosis not present
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
## 2017-01-23 ENCOUNTER — Telehealth: Payer: Self-pay | Admitting: Emergency Medicine

## 2017-01-23 NOTE — Telephone Encounter (Signed)
Requesting xray disc for foot to take to Dr.Hewitt. Called imaging and they will make disc and [facility] it at [facility] reception desk for her to pick up later today.

## 2017-01-25 ENCOUNTER — Ambulatory Visit (HOSPITAL_BASED_OUTPATIENT_CLINIC_OR_DEPARTMENT_OTHER): Payer: BLUE CROSS/BLUE SHIELD

## 2017-02-01 ENCOUNTER — Ambulatory Visit (HOSPITAL_BASED_OUTPATIENT_CLINIC_OR_DEPARTMENT_OTHER): Payer: BLUE CROSS/BLUE SHIELD

## 2017-05-15 ENCOUNTER — Other Ambulatory Visit: Payer: Self-pay | Admitting: Sports Medicine

## 2017-05-15 DIAGNOSIS — M5030 Other cervical disc degeneration, unspecified cervical region: Secondary | ICD-10-CM

## 2017-06-05 ENCOUNTER — Other Ambulatory Visit: Payer: Self-pay | Admitting: Sports Medicine

## 2017-06-05 DIAGNOSIS — G43109 Migraine with aura, not intractable, without status migrainosus: Secondary | ICD-10-CM

## 2017-09-11 ENCOUNTER — Other Ambulatory Visit: Payer: Self-pay | Admitting: Sports Medicine

## 2017-10-28 DIAGNOSIS — Z1231 Encounter for screening mammogram for malignant neoplasm of breast: Secondary | ICD-10-CM | POA: Diagnosis not present

## 2017-10-28 LAB — HM MAMMOGRAPHY

## 2017-11-08 DIAGNOSIS — R928 Other abnormal and inconclusive findings on diagnostic imaging of breast: Secondary | ICD-10-CM | POA: Diagnosis not present

## 2017-11-08 DIAGNOSIS — N6011 Diffuse cystic mastopathy of right breast: Secondary | ICD-10-CM | POA: Diagnosis not present

## 2017-11-18 ENCOUNTER — Other Ambulatory Visit: Payer: Self-pay | Admitting: Sports Medicine

## 2017-11-18 DIAGNOSIS — R059 Cough, unspecified: Secondary | ICD-10-CM

## 2017-11-18 DIAGNOSIS — R05 Cough: Secondary | ICD-10-CM

## 2017-11-22 ENCOUNTER — Other Ambulatory Visit: Payer: Self-pay | Admitting: Sports Medicine

## 2017-11-22 DIAGNOSIS — G5602 Carpal tunnel syndrome, left upper limb: Secondary | ICD-10-CM

## 2017-11-22 DIAGNOSIS — G43109 Migraine with aura, not intractable, without status migrainosus: Secondary | ICD-10-CM

## 2017-12-06 ENCOUNTER — Other Ambulatory Visit: Payer: Self-pay | Admitting: Sports Medicine

## 2017-12-08 ENCOUNTER — Ambulatory Visit: Payer: BLUE CROSS/BLUE SHIELD | Admitting: Family Medicine

## 2017-12-08 ENCOUNTER — Encounter: Payer: Self-pay | Admitting: Family Medicine

## 2017-12-08 ENCOUNTER — Ambulatory Visit (HOSPITAL_BASED_OUTPATIENT_CLINIC_OR_DEPARTMENT_OTHER)
Admission: RE | Admit: 2017-12-08 | Discharge: 2017-12-08 | Disposition: A | Discharge status: Home / Self Care | Payer: BLUE CROSS/BLUE SHIELD | Source: Ambulatory Visit | Attending: Family Medicine | Admitting: Family Medicine

## 2017-12-08 VITALS — BP 132/79 | HR 100 | Ht 69.0 in | Wt 119.0 lb

## 2017-12-08 DIAGNOSIS — R05 Cough: Secondary | ICD-10-CM | POA: Diagnosis not present

## 2017-12-08 DIAGNOSIS — R0781 Pleurodynia: Secondary | ICD-10-CM

## 2017-12-08 DIAGNOSIS — X58XXXA Exposure to other specified factors, initial encounter: Secondary | ICD-10-CM | POA: Diagnosis not present

## 2017-12-08 DIAGNOSIS — R059 Cough, unspecified: Secondary | ICD-10-CM

## 2017-12-08 DIAGNOSIS — S2232XA Fracture of one rib, left side, initial encounter for closed fracture: Secondary | ICD-10-CM | POA: Diagnosis not present
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
## 2017-12-22 ENCOUNTER — Encounter: Payer: Self-pay | Admitting: Family Medicine

## 2018-01-02 ENCOUNTER — Other Ambulatory Visit: Payer: Self-pay | Admitting: Sports Medicine

## 2018-01-23 ENCOUNTER — Encounter: Payer: Self-pay | Admitting: Sports Medicine

## 2018-01-23 ENCOUNTER — Ambulatory Visit (INDEPENDENT_AMBULATORY_CARE_PROVIDER_SITE_OTHER): Payer: BLUE CROSS/BLUE SHIELD | Admitting: Sports Medicine

## 2018-01-23 DIAGNOSIS — F419 Anxiety disorder, unspecified: Secondary | ICD-10-CM

## 2018-01-23 DIAGNOSIS — F329 Major depressive disorder, single episode, unspecified: Secondary | ICD-10-CM | POA: Diagnosis not present

## 2018-01-23 DIAGNOSIS — R5383 Other fatigue: Secondary | ICD-10-CM | POA: Diagnosis not present

## 2018-01-23 DIAGNOSIS — F32A Depression, unspecified: Secondary | ICD-10-CM

## 2018-01-23 DIAGNOSIS — M5416 Radiculopathy, lumbar region: Secondary | ICD-10-CM | POA: Diagnosis not present

## 2018-01-23 MED ORDER — BUSPIRONE HCL 5 MG PO TABS: 5.0000 mg | ORAL_TABLET | Freq: Two times a day (BID) | ORAL | 3 refills | Status: DC

## 2018-01-23 MED ORDER — PHENTERMINE HCL 8 MG PO TABS: 1.0000 | ORAL_TABLET | Freq: Every day | ORAL | 2 refills | Status: DC

## 2018-01-23 NOTE — Assessment & Plan Note (Signed)
A massive workup was negative, things have done well when she was on phentermine, she has been off of it, I am happy to restart it at a low dose, ultimately we doing this more for chronic fatigue syndrome, she does however desire to lose about 10 pounds which I think is fine, she will increase her diligence with aerobic and resistance exercises. Return in 3 months for this.

## 2018-01-23 NOTE — Assessment & Plan Note (Signed)
Per patient request switching from sertraline to BuSpar, she only has anxiety. Return in 1 month for PHQ/GAD.

## 2018-01-23 NOTE — Progress Notes (Signed)
Subjective:    CC: Multiple issues  HPI: Back pain: Known lumbar degenerative disc disease, has responded well to lumbar epidurals in the past, more recently was helping her daughter clean up the room, bent forward, felt a pop in the right side of her low back with increase in axial discogenic back pain, worse with sitting flexion Valsalva, no bowel or bladder dysfunction, saddle numbness, constitutional symptoms.  Generalized anxiety: No longer well controlled with sertraline, also worried about the weight gain associated.  Would like to switch to something else.  Denies any symptoms of depression.  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  Chronic fatigue: Would like to restart phentermine.  Would also like to drop about 10 pounds.  I reviewed the past medical history, family history, social history, surgical history, and allergies today and no changes were needed.  Please see the problem list section below in epic for further details.  Past Medical History: Past Medical History:  Diagnosis Date  . Anxiety   . GERD (gastroesophageal reflux disease)   . Hypertension   . IBS (irritable bowel syndrome)   . Lumbar herniated disc   . Migraine   . Pre-eclampsia    with daughter  . PVC (premature ventricular contraction)    Previous holter in Dec 2011 showed PVCs and 2 3 beat runs of SVT  . Seasonal allergies    Past Surgical History: Past Surgical History:  Procedure Laterality Date  . NO PAST SURGERIES     Social History: Social History   Socioeconomic History  . Marital status: Married    Spouse name: None  . Number of children: 1  . Years of education: 75  . Highest education level: None  Social Needs  . Financial resource strain: None  . Food insecurity - worry: None  . Food insecurity - inability: None  . Transportation needs - medical: None  . Transportation needs - non-medical: None    Occupational History  . Occupation: Optician, dispensing.     Employer: [employer]    Comment: consultant  Tobacco Use  . Smoking status: Never Smoker  . Smokeless tobacco: Never Used  Substance and Sexual Activity  . Alcohol use: Yes    Alcohol/week: 0.6 - 1.2 oz    Types: 1 - 2 Standard drinks or equivalent per week    Comment: Socially  . Drug use: No  . 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  Other Topics Concern  . None  Social History Narrative   Lives at home with her husband and daughter.   Left-handed.   4-6 cups caffeine per day.   Family History: Family History  Problem Relation Age of Onset  . Hypertension Mother   . Hyperlipidemia Mother   . Breast cancer Mother 54  . Diverticulitis Father   . Hypertension Unknown        grandmother  . Alcohol abuse Unknown        grandparent   Allergies: No Known Allergies Medications: See med rec.  Review of Systems: No fevers, chills, night sweats, weight loss, chest pain, or shortness of breath.   Objective:    General: Well Developed, well nourished, and in no acute distress.  Neuro: Alert and oriented x3, extra-ocular muscles intact, sensation grossly intact.  HEENT: Normocephalic, atraumatic, pupils  equal round reactive to light, neck supple, no masses, no lymphadenopathy, thyroid nonpalpable.  Skin: Warm and dry, no rashes. Cardiac: Regular rate and rhythm, no murmurs rubs or gallops, no lower extremity edema.  Respiratory: Clear to auscultation bilaterally. Not using accessory muscles, speaking in full sentences. Back Exam:  Inspection: Unremarkable  Motion: Flexion 45 deg, Extension 45 deg, Side Bending to 45 deg bilaterally,  Rotation to 45 deg bilaterally  SLR laying: Negative  XSLR laying: Negative  Palpable tenderness: None. FABER: negative. Sensory change: Gross sensation intact to all lumbar and sacral  dermatomes.  Reflexes: 2+ at both patellar tendons, 2+ at achilles tendons, Babinski's downgoing.  Strength at foot  Plantar-flexion: 5/5 Dorsi-flexion: 5/5 Eversion: 5/5 Inversion: 5/5  Leg strength  Quad: 5/5 Hamstring: 5/5 Hip flexor: 5/5 Hip abductors: 5/5  Gait unremarkable.  Impression and Recommendations:    Anxiety and depression Per patient request switching from sertraline to BuSpar, she only has anxiety. Return in 1 month for PHQ/GAD.  Left lumbar radiculitis Responded well to epidural several years ago, now having a recurrence of axial discogenic back pain, we are going to start conservative, rehab exercises given, no medicines or epidurals at this point.  Fatigue A massive workup was negative, things have done well when she was on phentermine, she has been off of it, I am happy to restart it at a low dose, ultimately we doing this more for chronic fatigue syndrome, she does however desire to lose about 10 pounds which I think is fine, she will increase her diligence with aerobic and resistance exercises. Return in 3 months for this. ___________________________________________ Ihor Austinhomas J. Benjamin Stainhekkekandam, M.D., ABFM., CAQSM. Primary Care and Sports Medicine [facility]  Adjunct Instructor of Family Medicine  [employer]

## 2018-01-23 NOTE — Assessment & Plan Note (Signed)
Responded well to epidural several years ago, now having a recurrence of axial discogenic back pain, we are going to start conservative, rehab exercises given, no medicines or epidurals at this point.

## 2018-02-01 ENCOUNTER — Encounter: Payer: Self-pay | Admitting: Sports Medicine

## 2018-02-12 ENCOUNTER — Other Ambulatory Visit: Payer: Self-pay | Admitting: Sports Medicine

## 2018-02-12 DIAGNOSIS — M5030 Other cervical disc degeneration, unspecified cervical region: Secondary | ICD-10-CM

## 2018-02-23 ENCOUNTER — Other Ambulatory Visit: Payer: Self-pay

## 2018-03-29 ENCOUNTER — Other Ambulatory Visit: Payer: Self-pay

## 2018-03-29 DIAGNOSIS — F419 Anxiety disorder, unspecified: Principal | ICD-10-CM

## 2018-03-29 DIAGNOSIS — F32A Depression, unspecified: Secondary | ICD-10-CM

## 2018-03-29 DIAGNOSIS — F329 Major depressive disorder, single episode, unspecified: Secondary | ICD-10-CM

## 2018-03-29 MED ORDER — BUSPIRONE HCL 5 MG PO TABS: 5.0000 mg | ORAL_TABLET | Freq: Two times a day (BID) | ORAL | 0 refills | Status: DC

## 2018-05-22 ENCOUNTER — Other Ambulatory Visit: Payer: Self-pay | Admitting: Sports Medicine

## 2018-05-22 DIAGNOSIS — G43109 Migraine with aura, not intractable, without status migrainosus: Secondary | ICD-10-CM

## 2018-06-05 ENCOUNTER — Other Ambulatory Visit: Payer: Self-pay | Admitting: Family Medicine

## 2018-06-05 DIAGNOSIS — G43109 Migraine with aura, not intractable, without status migrainosus: Secondary | ICD-10-CM

## 2018-06-06 ENCOUNTER — Other Ambulatory Visit: Payer: Self-pay | Admitting: *Deleted

## 2018-06-06 DIAGNOSIS — N6459 Other signs and symptoms in breast: Secondary | ICD-10-CM | POA: Diagnosis not present

## 2018-06-06 DIAGNOSIS — R922 Inconclusive mammogram: Secondary | ICD-10-CM | POA: Diagnosis not present

## 2018-06-06 DIAGNOSIS — N6001 Solitary cyst of right breast: Secondary | ICD-10-CM | POA: Diagnosis not present

## 2018-06-06 DIAGNOSIS — N6011 Diffuse cystic mastopathy of right breast: Secondary | ICD-10-CM | POA: Diagnosis not present

## 2018-06-06 DIAGNOSIS — G43109 Migraine with aura, not intractable, without status migrainosus: Secondary | ICD-10-CM

## 2018-06-06 MED ORDER — TOPIRAMATE 50 MG PO TABS: 50.0000 mg | ORAL_TABLET | Freq: Every day | ORAL | 1 refills | Status: DC

## 2018-06-18 ENCOUNTER — Encounter: Payer: Self-pay | Admitting: Family Medicine

## 2018-06-18 ENCOUNTER — Ambulatory Visit (INDEPENDENT_AMBULATORY_CARE_PROVIDER_SITE_OTHER): Payer: BLUE CROSS/BLUE SHIELD | Admitting: Family Medicine

## 2018-06-18 VITALS — BP 127/87 | HR 84 | Ht 63.0 in | Wt 125.0 lb

## 2018-06-18 DIAGNOSIS — M79605 Pain in left leg: Secondary | ICD-10-CM

## 2018-06-18 NOTE — Progress Notes (Signed)
Melinda Parsons is a 47 y.o. female who presents to [facility] today for acute intermittent leg pain.   Yesterday when she was having lunch with her daughter she had a sharp severe pain in the back of her left knee followed by a sharp pain in her chest which resolved after a couple seconds.  She then had 3-4 similar episodes throughout the day and last night, waking her from her sleep.  She says she had a 4 hour car trip prior to the episode and uses a Nuvaring. She denies leg swelling and shortness of breath.  She has a history of neuropathy but says this is unlike anything she has experienced in the past.  She notes mild fatigue and some mild pain in her upper extremities as well.  She denies any injury fevers or chills or new medications.   ROS:  As above  Exam:  BP 127/87   Pulse 84   Ht 5\' 3"  (1.6 m)   Wt 125 lb (56.7 kg)   BMI 22.14 kg/m  General: Well Developed, well nourished, and in no acute distress.  Neuro/Psych: Alert and oriented x3, extra-ocular muscles intact, able to move all 4 extremities, sensation grossly intact. Patellar and achilles reflexes 2+ bilaterally.  Skin: Warm and dry, no rashes noted.  Respiratory: Not using accessory muscles, speaking in full sentences, trachea midline.  Cardiovascular: Pulses palpable, no extremity edema. Abdomen: Does not appear distended. L spine: Nontender to spinal midline.  Negative slump test. Normal range of motion. Normal strength. Normal flexion/extension.  Left leg: Normal-appearing normal motion.  Posterior knee and posterior leg nontender  Calves bilaterally with no significant palpable cords or erythema or tenderness.  Negative calf squeeze test bilaterally.  Calf diameters are equal bilaterally.  Normal gait.   Lab and Radiology Results Images of CT scan abdomen and pelvis from January 2018 personally and independently reviewed   Assessment and Plan: 47 y.o. female with  acute leg pain. Due to the history of long car ride and Nuvaring we will get a D dimer to rule out DVT/PE, however this is unlikely in the absence of leg swelling or shortness of breath. We will also get a sed rate to assess systemic inflammatory status. She was instructed not to restrict any activities and to follow up if persists.     Orders Placed This Encounter  Procedures  . CBC  . COMPLETE METABOLIC PANEL WITH GFR  . D-Dimer, Quantitative  . Sedimentation rate   No orders of the defined types were placed in this encounter.   Historical information moved to improve visibility of documentation.  Past Medical History:  Diagnosis Date  . Anxiety   . GERD (gastroesophageal reflux disease)   . Hypertension   . IBS (irritable bowel syndrome)   . Lumbar herniated disc   . Migraine   . Pre-eclampsia    with daughter  . PVC (premature ventricular contraction)    Previous holter in Dec 2011 showed PVCs and 2 3 beat runs of SVT  . Seasonal allergies    Past Surgical History:  Procedure Laterality Date  . NO PAST SURGERIES     Social History   Tobacco Use  . Smoking status: Never Smoker  . Smokeless tobacco: Never Used  Substance Use Topics  . Alcohol use: Yes    Alcohol/week: 0.6 - 1.2 oz    Types: 1 - 2 Standard drinks or equivalent per week    Comment: Socially  family history includes Alcohol abuse in her unknown relative; Breast cancer (age of onset: 65) in her mother; Diverticulitis in her father; Hyperlipidemia in her mother; Hypertension in her mother and unknown relative.  Medications: Current Outpatient Medications  Medication Sig Dispense Refill  . busPIRone (BUSPAR) 5 MG tablet Take 1 tablet (5 mg total) by mouth 2 (two) times daily. 180 tablet 0  . fexofenadine (ALLEGRA) 180 MG tablet Take 180 mg by mouth daily.    . fluticasone (FLONASE) 50 MCG/ACT nasal spray INSTILL 1 SPRAY INTO EACH NOSTRIL TWICE A DAY. USE LEFT HAND FOR RIGHT NOSTRIL, RIGHT HAND FOR LEFT  48 g 2  . gabapentin (NEURONTIN) 600 MG tablet TAKE 1 TABLET (600 MG TOTAL) BY MOUTH AT BEDTIME. 90 tablet 2  . hyoscyamine (LEVBID) 0.375 MG 12 hr tablet Take 0.375 mg by mouth 2 (two) times daily. She is taking 1 tablet daily    . NUVARING 0.12-0.015 MG/24HR vaginal ring INSERT ONE RING VAGINALLY ONCE A MONTH AS DIRECTED BY PHYSICIAN  3  . Phentermine HCl 8 MG TABS Take 1 tablet by mouth daily. 30 each 2  . topiramate (TOPAMAX) 50 MG tablet Take 1 tablet (50 mg total) by mouth at bedtime. 90 tablet 1  . vitamin B-12 (CYANOCOBALAMIN) 1000 MCG tablet TAKE 1 TABLET (1,000 MCG TOTAL) BY MOUTH DAILY. 30 tablet 11   No current facility-administered medications for this visit.    No Known Allergies    Discussed warning signs or symptoms. Please see discharge instructions. Patient expresses understanding.  I personally was present and performed or re-performed the history, physical exam and medical decision-making activities of this service and have verified that the service and findings are accurately documented in the student's note. ___________________________________________ Clementeen Graham M.D., ABFM., CAQSM. Primary Care and Sports Medicine Adjunct Instructor of Family Medicine  [employer]

## 2018-06-18 NOTE — Patient Instructions (Addendum)
Thank you for coming in today.  Get labs today.   If DDimer is positive we will proceed with CT scan and [place] to evaluate for blood clot.   We will do some watchful waiting if labs are negative.   Let me know if you are not doing well.    Deep Vein Thrombosis Deep vein thrombosis (DVT) is a condition in which a blood clot forms in a deep vein, such as a lower leg, thigh, or arm vein. A clot is blood that has thickened into a gel or solid. This condition is dangerous. It can lead to serious and even life-threatening complications if the clot travels to the lungs and causes a blockage (pulmonary embolism). It can also damage veins in the leg. This can result in leg pain, swelling, discoloration, and sores (post-thrombotic syndrome). What are the causes? This condition may be caused by:  A slowdown of blood flow.  Damage to a vein.  A condition that makes blood clot more easily.  What increases the risk? The following factors may make you more likely to develop this condition:  Being overweight.  Being elderly, especially over age 53.  Sitting or lying down for more than four hours.  Lack of physical activity (sedentary lifestyle).  Being pregnant, giving birth, or having recently given birth.  Taking medicines that contain estrogen.  Smoking.  A history of any of the following: ? Blood clots or blood clotting disease. ? Peripheral vascular disease. ? Inflammatory bowel disease. ? Cancer. ? Heart disease. ? Genetic conditions that affect how blood clots. ? Neurological diseases that affect the legs (leg paresis). ? Injury. ? Major or lengthy surgery. ? A central line placed inside a large vein.  What are the signs or symptoms? Symptoms of this condition include:  Swelling, pain, or tenderness in an arm or leg.  Warmth, redness, or discoloration in an arm or leg.  If the clot is in your leg, symptoms may be more noticeable or worse when you stand or walk. Some  people do not have any symptoms. How is this diagnosed? This condition is diagnosed with:  A medical history.  A physical exam.  Tests, such as: ? Blood tests. These are done to see how your blood clots. ? Imaging tests. These are done to check for clots. Tests may include:  Ultrasound.  CT scan.  MRI.  X-ray.  Venogram. For this test, X-rays are taken after a dye is injected into a vein.  How is this treated? Treatment for this condition depends on the cause, your risk for bleeding or developing more clots, and any medical conditions you have. Treatment may include:  Taking blood thinners (also called anticoagulants). These medicines may be taken by mouth, injected under the skin, or injected through an IV tube (catheter). These medicines prevent clots from forming.  Injecting medicine that dissolves blood clots into the affected vein (catheter-directed thrombolysis).  Having surgery. Surgery may be done to: ? Remove the clot. ? Place a filter in a large vein to catch blood clots before they reach the lungs.  Some treatments may be continued for up to six months. Follow these instructions at home: If you are taking an oral blood thinner:  Take the medicine exactly as told by your health care provider. Some blood thinners need to be taken at the same time every day. Do not skip a dose.  Ask your health care provider about what foods and drugs interact with the medicine.  Ask  about possible side effects. General instructions  Blood thinners can cause easy bruising and difficulty stopping bleeding. Because of this, if you are taking or were given a blood thinner: ? Hold pressure over cuts for longer than usual. ? Tell your dentist and other health care providers that you are taking blood thinners before having any procedures that can cause bleeding. ? Avoid contact sports.  Take over-the-counter and prescription medicines only as told by your health care  provider.  Return to your normal activities as told by your health care provider. Ask your health care provider what activities are safe for you.  Wear compression stockings if recommended by your health care provider.  Keep all follow-up visits as told by your health care provider. This is important. How is this prevented? To lower your risk of developing this condition again:  For 30 or more minutes every day, do an activity that: ? Involves moving your arms and legs. ? Increases your heart rate.  When traveling for longer than four hours: ? Exercise your arms and legs every hour. ? Drink plenty of water. ? Avoid drinking alcohol.  Avoid sitting or lying for a long time without moving your legs.  Stay a healthy weight.  If you are a woman who is older than age 46, avoid unnecessary use of medicines that contain estrogen.  Do not use any products that contain nicotine or tobacco, such as cigarettes and e-cigarettes. This is especially important if you take estrogen medicines. If you need help quitting, ask your health care provider.  Contact a health care provider if:  You miss a dose of your blood thinner.  You have nausea, vomiting, or diarrhea that lasts for more than one day.  Your menstrual period is heavier than usual.  You have unusual bruising. Get help right away if:  You have new or increased pain, swelling, or redness in an arm or leg.  You have numbness or tingling in an arm or leg.  You have shortness of breath.  You have chest pain.  You have a rapid or irregular heartbeat.  You feel light-headed or dizzy.  You cough up blood.  There is blood in your vomit, stool, or urine.  You have a serious fall or accident, or you hit your head.  You have a severe headache or confusion.  You have a cut that will not stop bleeding. These symptoms may represent a serious problem that is an emergency. Do not wait to see if the symptoms will go away. Get  medical help right away. Call your local emergency services (911 in the [place] Do not drive yourself to the hospital. Summary  DVT is a condition in which a blood clot forms in a deep vein, such as a lower leg, thigh, or arm vein.  Symptoms can include swelling, warmth, pain, and redness in your leg or arm.  Treatment may include taking blood thinners, injecting medicine that dissolves blood clots,wearing compression stockings, or surgery.  If you are prescribed blood thinners, take them exactly as told. This information is not intended to replace advice given to you by your health care provider. Make sure you discuss any questions you have with your health care provider. Document Released: 11/28/2005 Document Revised: 12/31/2016 Document Reviewed: 12/31/2016 Elsevier Interactive Patient Education  2018 [employer]

## 2018-06-19 ENCOUNTER — Encounter: Payer: Self-pay | Admitting: Family Medicine

## 2018-06-19 LAB — COMPLETE METABOLIC PANEL WITH GFR
AG Ratio: 1.7 (calc) (ref 1.0–2.5)
ALKALINE PHOSPHATASE (APISO): 40 U/L (ref 33–115)
ALT: 12 U/L (ref 6–29)
AST: 14 U/L (ref 10–35)
Albumin: 4.7 g/dL (ref 3.6–5.1)
BUN: 11 mg/dL (ref 7–25)
CO2: 22 mmol/L (ref 20–32)
CREATININE: 0.94 mg/dL (ref 0.50–1.10)
Calcium: 9.4 mg/dL (ref 8.6–10.2)
Chloride: 107 mmol/L (ref 98–110)
GFR, EST NON AFRICAN AMERICAN: 73 mL/min/{1.73_m2} (ref >=60)
GFR, Est African American: 85 mL/min/{1.73_m2} (ref >=60)
GLOBULIN: 2.8 g/dL (ref 1.9–3.7)
Glucose, Bld: 83 mg/dL (ref 65–139)
POTASSIUM: 4.5 mmol/L (ref 3.5–5.3)
SODIUM: 139 mmol/L (ref 135–146)
Total Bilirubin: 0.5 mg/dL (ref 0.2–1.2)
Total Protein: 7.5 g/dL (ref 6.1–8.1)

## 2018-06-19 LAB — CBC
HCT: 44.5 % (ref 35.0–45.0)
Hemoglobin: 15.2 g/dL (ref 11.7–15.5)
MCH: 31.7 pg (ref 27.0–33.0)
MCHC: 34.2 g/dL (ref 32.0–36.0)
MCV: 92.7 fL (ref 80.0–100.0)
MPV: 10.1 fL (ref 7.5–12.5)
PLATELETS: 292 10*3/uL (ref 140–400)
RBC: 4.8 10*6/uL (ref 3.80–5.10)
RDW: 11.9 % (ref 11.0–15.0)
WBC: 6.7 10*3/uL (ref 3.8–10.8)

## 2018-06-19 LAB — D-DIMER, QUANTITATIVE: D-Dimer, Quant: 0.28 mcg/mL FEU (ref <0.50)

## 2018-06-19 LAB — SEDIMENTATION RATE: Sed Rate: 2 mm/h (ref 0–20)

## 2018-07-06 DIAGNOSIS — K580 Irritable bowel syndrome with diarrhea: Secondary | ICD-10-CM | POA: Diagnosis not present

## 2018-07-06 DIAGNOSIS — K219 Gastro-esophageal reflux disease without esophagitis: Secondary | ICD-10-CM | POA: Diagnosis not present

## 2018-07-31 ENCOUNTER — Other Ambulatory Visit: Payer: Self-pay | Admitting: Sports Medicine

## 2018-07-31 DIAGNOSIS — F329 Major depressive disorder, single episode, unspecified: Secondary | ICD-10-CM

## 2018-07-31 DIAGNOSIS — F419 Anxiety disorder, unspecified: Principal | ICD-10-CM

## 2018-07-31 DIAGNOSIS — F32A Depression, unspecified: Secondary | ICD-10-CM

## 2018-10-17 DIAGNOSIS — Z01419 Encounter for gynecological examination (general) (routine) without abnormal findings: Secondary | ICD-10-CM | POA: Diagnosis not present

## 2018-10-17 DIAGNOSIS — Z6821 Body mass index (BMI) 21.0-21.9, adult: Secondary | ICD-10-CM | POA: Diagnosis not present

## 2018-11-12 ENCOUNTER — Other Ambulatory Visit: Payer: Self-pay | Admitting: Sports Medicine

## 2018-11-12 DIAGNOSIS — M5030 Other cervical disc degeneration, unspecified cervical region: Secondary | ICD-10-CM

## 2018-12-11 DIAGNOSIS — N63 Unspecified lump in unspecified breast: Secondary | ICD-10-CM | POA: Diagnosis not present

## 2018-12-11 DIAGNOSIS — N6011 Diffuse cystic mastopathy of right breast: Secondary | ICD-10-CM | POA: Diagnosis not present

## 2018-12-11 DIAGNOSIS — R922 Inconclusive mammogram: Secondary | ICD-10-CM | POA: Diagnosis not present

## 2019-01-22 ENCOUNTER — Telehealth: Payer: Self-pay

## 2019-01-22 ENCOUNTER — Other Ambulatory Visit: Payer: Self-pay | Admitting: Sports Medicine

## 2019-01-22 DIAGNOSIS — G43109 Migraine with aura, not intractable, without status migrainosus: Secondary | ICD-10-CM

## 2019-01-22 NOTE — Telephone Encounter (Signed)
[facility] in [place] called and left a message stating we refilled the Topamax for 30 days and her insurance will only cover a 90 day prescription.   I called patient to get her scheduled for a follow up appointment.   Left a message for a return call to schedule before a 90 day can be sent.

## 2019-02-11 ENCOUNTER — Encounter: Payer: Self-pay | Admitting: Osteopathic Medicine

## 2019-02-11 ENCOUNTER — Ambulatory Visit: Payer: BLUE CROSS/BLUE SHIELD | Admitting: Family Medicine

## 2019-02-11 ENCOUNTER — Telehealth: Payer: Self-pay

## 2019-02-11 ENCOUNTER — Ambulatory Visit: Payer: BLUE CROSS/BLUE SHIELD | Admitting: Osteopathic Medicine

## 2019-02-11 VITALS — BP 148/98 | HR 78 | Temp 98.4°F | Wt 127.6 lb

## 2019-02-11 DIAGNOSIS — J029 Acute pharyngitis, unspecified: Secondary | ICD-10-CM

## 2019-02-11 DIAGNOSIS — J028 Acute pharyngitis due to other specified organisms: Secondary | ICD-10-CM

## 2019-02-11 DIAGNOSIS — B9789 Other viral agents as the cause of diseases classified elsewhere: Secondary | ICD-10-CM

## 2019-02-11 DIAGNOSIS — J069 Acute upper respiratory infection, unspecified: Secondary | ICD-10-CM | POA: Diagnosis not present

## 2019-02-11 MED ORDER — PREDNISONE 20 MG PO TABS: 20.0000 mg | ORAL_TABLET | Freq: Two times a day (BID) | ORAL | 0 refills | Status: DC

## 2019-02-11 MED ORDER — BENZONATATE 200 MG PO CAPS: 200.0000 mg | ORAL_CAPSULE | Freq: Three times a day (TID) | ORAL | 0 refills | Status: DC | PRN

## 2019-02-11 MED ORDER — GUAIFENESIN-CODEINE 100-10 MG/5ML PO SYRP: 5.0000 mL | ORAL_SOLUTION | Freq: Three times a day (TID) | ORAL | 0 refills | Status: DC | PRN

## 2019-02-11 NOTE — Telephone Encounter (Addendum)
Called and left a message about being congested. I called to schedule patient for an appointment.

## 2019-02-11 NOTE — Progress Notes (Signed)
HPI: Melinda Parsons is a 48 y.o. female who  has a past medical history of Anxiety, GERD (gastroesophageal reflux disease), Hypertension, IBS (irritable bowel syndrome), Lumbar herniated disc, Migraine, Pre-eclampsia, PVC (premature ventricular contraction), and Seasonal allergies.  she presents to [facility] today, 02/11/19,  for chief complaint of: Sick - respiratory   . Location/Quality: dry sore throat, hoarseness, cough, chest congestion  . Duration: 4 days  . Modifying factors: Tylenol and cough drops helping somewhat      Past medical, surgical, social and family history reviewed and updated as necessary.   Current medication list and allergy/intolerance information reviewed:    Current Outpatient Medications  Medication Sig Dispense Refill  . busPIRone (BUSPAR) 5 MG tablet TAKE 1 TABLET BY MOUTH TWICE A DAY 180 tablet 0  . fexofenadine (ALLEGRA) 180 MG tablet Take 180 mg by mouth daily.    . fluticasone (FLONASE) 50 MCG/ACT nasal spray INSTILL 1 SPRAY INTO EACH NOSTRIL TWICE A DAY. USE LEFT HAND FOR RIGHT NOSTRIL, RIGHT HAND FOR LEFT 48 g 2  . gabapentin (NEURONTIN) 600 MG tablet TAKE 1 TABLET (600 MG TOTAL) BY MOUTH AT BEDTIME. 90 tablet 2  . hyoscyamine (LEVBID) 0.375 MG 12 hr tablet Take 0.375 mg by mouth 2 (two) times daily. She is taking 1 tablet daily    . NUVARING 0.12-0.015 MG/24HR vaginal ring INSERT ONE RING VAGINALLY ONCE A MONTH AS DIRECTED BY PHYSICIAN  3  . Phentermine HCl 8 MG TABS Take 1 tablet by mouth daily. 30 each 2  . topiramate (TOPAMAX) 50 MG tablet Take 1 tablet (50 mg total) by mouth at bedtime. Needs appt for refills 30 tablet 0  . vitamin B-12 (CYANOCOBALAMIN) 1000 MCG tablet TAKE 1 TABLET (1,000 MCG TOTAL) BY MOUTH DAILY. 30 tablet 11  . benzonatate (TESSALON) 200 MG capsule Take 1 capsule (200 mg total) by mouth 3 (three) times daily as needed for cough. 30 capsule 0  . guaiFENesin-codeine (ROBITUSSIN AC) 100-10  MG/5ML syrup Take 5-10 mLs by mouth 3 (three) times daily as needed for cough. 118 mL 0  . predniSONE (DELTASONE) 20 MG tablet Take 1 tablet (20 mg total) by mouth 2 (two) times daily with a meal. 10 tablet 0   No current facility-administered medications for this visit.     No Known Allergies    Review of Systems:  Constitutional:  No  fever, no chills, +recent illness, No unintentional weight changes. +significant fatigue.   HEENT: +headache, no vision change, no hearing change, +sore throat, +sinus pressure  Cardiac: No  chest pain, No  pressure, No palpitations  Respiratory:  No  shortness of breath. +Cough  Gastrointestinal: No  abdominal pain, No  nausea, No  vomiting,  No  blood in stool, No  diarrhea  Musculoskeletal: No new myalgia/arthralgia  Skin: No  Rash  Neurologic: No  weakness, No  dizziness  Exam:  BP (!) 148/98 (BP Location: Left Arm, Patient Position: Sitting, Cuff Size: Normal)   Pulse 78   Temp 98.4 F (36.9 C) (Oral)   Wt 127 lb 9.6 oz (57.9 kg)   SpO2 100%   BMI 22.60 kg/m   Constitutional: VS see above. General Appearance: alert, well-developed, well-nourished, NAD  Eyes: Normal lids and conjunctive, non-icteric sclera  Ears, Nose, Mouth, Throat: MMM, Normal external inspection ears/nares/mouth/lips/gums. TM normal bilaterally. Pharynx/tonsils +erythema, no exudate. Nasal mucosa normal.   Neck: No masses, trachea midline. No tenderness/mass appreciated. No lymphadenopathy  Respiratory: Normal respiratory effort.  no wheeze, no rhonchi, no rales  Cardiovascular: S1/S2 normal, no murmur, no rub/gallop auscultated. RRR. No lower extremity edema.   Gastrointestinal: Nontender, no masses. Bowel sounds normal.  Musculoskeletal: Gait normal.   Neurological: Normal balance/coordination. No tremor.   Skin: warm, dry, intact.   Psychiatric: Normal judgment/insight. Normal mood and affect.     ASSESSMENT/PLAN: The primary encounter diagnosis  was Acute viral pharyngitis. A diagnosis of Viral URI with cough was also pertinent to this visit.   Meds ordered this encounter  Medications  . predniSONE (DELTASONE) 20 MG tablet    Sig: Take 1 tablet (20 mg total) by mouth 2 (two) times daily with a meal.    Dispense:  10 tablet    Refill:  0  . benzonatate (TESSALON) 200 MG capsule    Sig: Take 1 capsule (200 mg total) by mouth 3 (three) times daily as needed for cough.    Dispense:  30 capsule    Refill:  0  . guaiFENesin-codeine (ROBITUSSIN AC) 100-10 MG/5ML syrup    Sig: Take 5-10 mLs by mouth 3 (three) times daily as needed for cough.    Dispense:  118 mL    Refill:  0      Patient Instructions  Medications & Home Remedies for Upper Respiratory Illness   Note: the following list assumes no pregnancy, normal liver & kidney function and no other drug interactions. Dr. Lyn Hollingshead has highlighted medications which are safe for you to use, but these may not be appropriate for everyone. Always ask a pharmacist or qualified medical provider if you have any questions!    Aches/Pains, Fever, Headache OTC Acetaminophen (Tylenol) 500 mg tablets - take max 2 tablets (1000 mg) every 6 hours (4 times per day)  OTC Ibuprofen (Motrin) 200 mg tablets - take max 4 tablets (800 mg) every 6 hours   Sinus Congestion OTC Nasal Saline if desired to rinse OTC Oxymetolazone (Afrin, others) sparing use due to rebound congestion, NEVER use in kids OTC Phenylephrine (Sudafed) 10 mg tablets every 4 hours (or the 12-hour formulation) OTC Diphenhydramine (Benadryl) 25 mg tablets - take max 2 tablets every 4 hours   Cough & Sore Throat Prescription cough pills or syrups as directed OTC Dextromethorphan (Robitussin, others) - cough suppressant OTC Guaifenesin (Robitussin, Mucinex, others) - expectorant (helps cough up mucus) (Dextromethorphan and Guaifenesin also come in a combination tablet/syrup) OTC Lozenges w/ Benzocaine + Menthol (Cepacol) Honey  - as much as you want! Teas which "coat the throat" - look for ingredients Elm Bark, Licorice Root, Marshmallow Root   Other Prescription Oral Steroids to decrease inflammation and improve energy Prescription Antibiotics if these are necessary for bacterial infection - take ALL, even if you're feeling better  OTC Zinc Lozenges within 24 hours of symptoms onset - mixed evidence this shortens the duration of the common cold Don't waste your money on Vitamin C or Echinacea in acute illness - it's already too late!          Visit summary with medication list and pertinent instructions was printed for patient to review. All questions at time of visit were answered - patient instructed to contact office with any additional concerns or updates. ER/RTC precautions were reviewed with the patient.   Follow-up plan: Return if symptoms worsen or fail to improve (call first) .  Please note: voice recognition software was used to produce this document, and typos may escape review. Please contact Dr. Lyn Hollingshead for any needed clarifications.

## 2019-02-11 NOTE — Patient Instructions (Signed)
Medications & Home Remedies for Upper Respiratory Illness   Note: the following list assumes no pregnancy, normal liver & kidney function and no other drug interactions. Dr. Rhona Fusilier has highlighted medications which are safe for you to use, but these may not be appropriate for everyone. Always ask a pharmacist or qualified medical provider if you have any questions!    Aches/Pains, Fever, Headache OTC Acetaminophen (Tylenol) 500 mg tablets - take max 2 tablets (1000 mg) every 6 hours (4 times per day)  OTC Ibuprofen (Motrin) 200 mg tablets - take max 4 tablets (800 mg) every 6 hours   Sinus Congestion OTC Nasal Saline if desired to rinse OTC Oxymetolazone (Afrin, others) sparing use due to rebound congestion, NEVER use in kids OTC Phenylephrine (Sudafed) 10 mg tablets every 4 hours (or the 12-hour formulation) OTC Diphenhydramine (Benadryl) 25 mg tablets - take max 2 tablets every 4 hours   Cough & Sore Throat Prescription cough pills or syrups as directed OTC Dextromethorphan (Robitussin, others) - cough suppressant OTC Guaifenesin (Robitussin, Mucinex, others) - expectorant (helps cough up mucus) (Dextromethorphan and Guaifenesin also come in a combination tablet/syrup) OTC Lozenges w/ Benzocaine + Menthol (Cepacol) Honey - as much as you want! Teas which "coat the throat" - look for ingredients Elm Bark, Licorice Root, Marshmallow Root   Other Prescription Oral Steroids to decrease inflammation and improve energy Prescription Antibiotics if these are necessary for bacterial infection - take ALL, even if you're feeling better  OTC Zinc Lozenges within 24 hours of symptoms onset - mixed evidence this shortens the duration of the common cold Don't waste your money on Vitamin C or Echinacea in acute illness - it's already too late!      

## 2019-02-15 ENCOUNTER — Ambulatory Visit: Payer: BLUE CROSS/BLUE SHIELD | Admitting: Osteopathic Medicine

## 2019-02-16 ENCOUNTER — Other Ambulatory Visit: Payer: Self-pay | Admitting: Sports Medicine

## 2019-02-16 DIAGNOSIS — F329 Major depressive disorder, single episode, unspecified: Secondary | ICD-10-CM

## 2019-02-16 DIAGNOSIS — F32A Depression, unspecified: Secondary | ICD-10-CM

## 2019-02-16 DIAGNOSIS — F419 Anxiety disorder, unspecified: Principal | ICD-10-CM

## 2019-02-26 ENCOUNTER — Telehealth: Payer: Self-pay

## 2019-02-26 NOTE — Telephone Encounter (Signed)
Letter printed for pt to pick up. States she is coming after 5:00 PM so I will take letter to Urgent Care to be collected. Pt was advised to bring photo ID with her to collect letter.   No further needs at this time.

## 2019-02-26 NOTE — Telephone Encounter (Signed)
Okay to okay to give patient work note saying that family member is higher risk.

## 2019-02-26 NOTE — Telephone Encounter (Signed)
Pt called stating at her work they are trying to let people work from home if they or family are high risk. Pt states her husband has mitral valve prolapse.   If her husband is high risk, she states she will need a note to work from home 02/27/19 to 03/13/19.   Please advise if you feel pt should work at home.

## 2019-03-14 ENCOUNTER — Other Ambulatory Visit: Payer: Self-pay | Admitting: Sports Medicine

## 2019-03-14 DIAGNOSIS — F32A Depression, unspecified: Secondary | ICD-10-CM

## 2019-03-14 DIAGNOSIS — F419 Anxiety disorder, unspecified: Principal | ICD-10-CM

## 2019-03-14 DIAGNOSIS — F329 Major depressive disorder, single episode, unspecified: Secondary | ICD-10-CM

## 2019-04-18 ENCOUNTER — Encounter: Payer: Self-pay | Admitting: Family Medicine

## 2019-04-18 ENCOUNTER — Ambulatory Visit (INDEPENDENT_AMBULATORY_CARE_PROVIDER_SITE_OTHER): Payer: BLUE CROSS/BLUE SHIELD | Admitting: Family Medicine

## 2019-04-18 VITALS — BP 141/97 | HR 94 | Temp 98.5°F | Wt 129.0 lb

## 2019-04-18 DIAGNOSIS — S0502XA Injury of conjunctiva and corneal abrasion without foreign body, left eye, initial encounter: Secondary | ICD-10-CM | POA: Diagnosis not present

## 2019-04-18 DIAGNOSIS — R05 Cough: Secondary | ICD-10-CM | POA: Diagnosis not present

## 2019-04-18 DIAGNOSIS — R059 Cough, unspecified: Secondary | ICD-10-CM

## 2019-04-18 MED ORDER — ERYTHROMYCIN 5 MG/GM OP OINT: 1.0000 "application " | TOPICAL_OINTMENT | Freq: Three times a day (TID) | OPHTHALMIC | 0 refills | Status: AC

## 2019-04-18 MED ORDER — FLUTICASONE PROPIONATE 50 MCG/ACT NA SUSP: NASAL | 2 refills | Status: DC

## 2019-04-18 NOTE — Progress Notes (Signed)
Melinda Parsons is a 48 y.o. female who presents to [facility]: [facility] Medicine today for left eye pain and swelling.  About a week ago she was walking when it was windy and she thinks maybe something blew into her eye but she is not sure.  She had a bit of eye irritation off and on last week.  However starting yesterday she developed worsening eye redness and swelling and some pain.  She notes some photophobia with it as well.  She denies any fevers or chills nausea vomiting or diarrhea.  She feels pretty well otherwise.  She notes a mild headache along with this.  She denies any significant change in visual acuity.  She denies any history of eye problems.  She does not use contact lenses.  She tried some over-the-counter eyedrops which did not help much.   ROS as above:  Exam:  BP (!) 141/97   Pulse 94   Temp 98.5 F (36.9 C) (Oral)   Wt 129 lb (58.5 kg)   BMI 22.85 kg/m  Wt Readings from Last 5 Encounters:  04/18/19 129 lb (58.5 kg)  02/11/19 127 lb 9.6 oz (57.9 kg)  06/18/18 125 lb (56.7 kg)  01/23/18 120 lb (54.4 kg)  12/08/17 119 lb (54 kg)    Gen: Well NAD HEENT: EOMI,  MMM left eye is erythematous with conjunctival injection.  She has normal ocular motion and normal pupillary light response.  No visible foreign bodies are present.  Her eye pain is improved with tetracaine eyedrops.  Fluorescein stain shows uptake at the 10 o'clock position indicating corneal abrasion.  Negative Seidel sign. Right eye exam is normal Lungs: Normal work of breathing. CTABL Heart: RRR no MRG Abd: NABS, Soft. Nondistended, Nontender Exts: Brisk capillary refill, warm and well perfused.   Lab and Radiology Results No results found for this or any previous visit (from the past 72 hour(s)). No results found.    Assessment and Plan: 48 y.o. female with left eye pain and swelling due to corneal  abrasion.  Patient may have secondary infection as well.  Additionally there may be a component of allergic conjunctivitis.  Plan to treat with erythromycin ointment and Zaditor eyedrops.  If not improving will refer to ophthalmology.  Precautions reviewed.  Recheck if not improving  Additionally Flonase nasal spray refilled.  Due to cough history. PDMP not reviewed this encounter. No orders of the defined types were placed in this encounter.  Meds ordered this encounter  Medications  . fluticasone (FLONASE) 50 MCG/ACT nasal spray    Sig: INSTILL 1 SPRAY INTO EACH NOSTRIL TWICE A DAY. USE LEFT HAND FOR RIGHT NOSTRIL, RIGHT HAND FOR LEFT    Dispense:  48 g    Refill:  2  . erythromycin ophthalmic ointment    Sig: Place 1 application into the left eye 3 (three) times daily for 5 days. Apply 1 inch ribbon to affected eye TID for 5 days.    Dispense:  3.5 g    Refill:  0     Historical information moved to improve visibility of documentation.  Past Medical History:  Diagnosis Date  . Anxiety   . GERD (gastroesophageal reflux disease)   . Hypertension   . IBS (irritable bowel syndrome)   . Lumbar herniated disc   . Migraine   . Pre-eclampsia    with daughter  . PVC (premature ventricular contraction)    Previous holter in Dec  2011 showed PVCs and 2 3 beat runs of SVT  . Seasonal allergies    Past Surgical History:  Procedure Laterality Date  . NO PAST SURGERIES     Social History   Tobacco Use  . Smoking status: Never Smoker  . Smokeless tobacco: Never Used  Substance Use Topics  . Alcohol use: Yes    Alcohol/week: 1.0 - 2.0 standard drinks    Types: 1 - 2 Standard drinks or equivalent per week    Comment: Socially   family history includes Alcohol abuse in her unknown relative; Breast cancer (age of onset: 7866) in her mother; Diverticulitis in her father; Hyperlipidemia in her mother; Hypertension in her mother and unknown relative.  Medications: Current Outpatient  Medications  Medication Sig Dispense Refill  . busPIRone (BUSPAR) 5 MG tablet TAKE 1 TABLET BY MOUTH TWICE A DAY 30 tablet 0  . fexofenadine (ALLEGRA) 180 MG tablet Take 180 mg by mouth daily.    . fluticasone (FLONASE) 50 MCG/ACT nasal spray INSTILL 1 SPRAY INTO EACH NOSTRIL TWICE A DAY. USE LEFT HAND FOR RIGHT NOSTRIL, RIGHT HAND FOR LEFT 48 g 2  . gabapentin (NEURONTIN) 600 MG tablet TAKE 1 TABLET (600 MG TOTAL) BY MOUTH AT BEDTIME. 90 tablet 2  . hyoscyamine (LEVBID) 0.375 MG 12 hr tablet Take 0.375 mg by mouth 2 (two) times daily. She is taking 1 tablet daily    . NUVARING 0.12-0.015 MG/24HR vaginal ring INSERT ONE RING VAGINALLY ONCE A MONTH AS DIRECTED BY PHYSICIAN  3  . topiramate (TOPAMAX) 50 MG tablet Take 1 tablet (50 mg total) by mouth at bedtime. Needs appt for refills 30 tablet 0  . vitamin B-12 (CYANOCOBALAMIN) 1000 MCG tablet TAKE 1 TABLET (1,000 MCG TOTAL) BY MOUTH DAILY. 30 tablet 11  . erythromycin ophthalmic ointment Place 1 application into the left eye 3 (three) times daily for 5 days. Apply 1 inch ribbon to affected eye TID for 5 days. 3.5 g 0   No current facility-administered medications for this visit.    No Known Allergies   Discussed warning signs or symptoms. Please see discharge instructions. Patient expresses understanding.

## 2019-04-18 NOTE — Patient Instructions (Signed)
Thank you for coming in today. I think you have a corneal abrasion that may have become infected.  Apply the antibiotic ointment 3-4 times daily for 5 days or so.  You may find that an eye patch if helpful to allow you eye to relax.  Use over-the-counter Zaditor eyedrops (Ketotifen) twice daily as needed for itching.   If worsening or not improving let us know. We can get your to an eye doctor same usually.    Corneal Abrasion  A corneal abrasion is a scratch or injury to the clear covering over the front of your eye (cornea). Your cornea forms a clear dome that protects your eye and helps to focus your vision. Your cornea is made up of many layers. The surface layer is a single layer of cells (corneal epithelium). It is one of the most sensitive tissues in your body. A corneal abrasion can be very painful. If a corneal abrasion is not treated, it can become infected and cause an ulcer. This can lead to scarring. A scarred cornea can affect your vision. Sometimes abrasions come back in the same area, even after the original injury has healed (recurrent erosion syndrome). What are the causes? This condition may be caused by:  A poke in the eye.  A gritty or irritating substance (foreign body) in the eye.  Excessive eye rubbing.  Very dry eyes.  Certain eye infections.  Contact lenses that fit poorly or are worn for a long period of time. You can also injure your cornea when putting contacts lenses in your eye or taking them out.  Eye surgery. Sometimes, the cause is unknown. What are the signs or symptoms? Symptoms of this condition include:  Eye pain. The pain may get worse when your eye is open or when you move your eye.  A feeling of something stuck in your eye.  Having trouble keeping your eye open, or not being able to keep it open.  Tearing and redness.  Sensitivity to light.  Blurred vision.  Headache. How is this diagnosed? This condition may be diagnosed based  on:  Your medical history.  Your symptoms.  An eye exam. You may work with a health care provider who specializes in diseases and conditions of the eye (ophthalmologist). Before the eye exam, numbing drops may be put into your eye. You may also have dye put in your eye with a dropper or a small paper strip. The dye makes the abrasion easy to see when your ophthalmologist examines your eye with a light. Your ophthalmologist may look at your eye through an eye scope (slit lamp). How is this treated? Treatment may vary depending on the cause of your condition, and it may include:  Washing out your eye.  Removing any foreign body.  Antibiotic drops or ointment to treat an infection.  Steroid drops or ointment to treat redness, irritation, or inflammation.  Pain medicine.  An eye patch to keep your eye closed. Follow these instructions at home: Medicines  Use eye drops or ointments as told by your eye care provider.  If you were prescribed antibiotic drops or ointment, use them as told by your eye care provider. Do not stop using the antibiotic even if you start to feel better.  Take over-the-counter and prescription medicines only as told by your eye care provider.  Do not drive or use heavy machinery while taking prescription pain medicine. General instructions  If you have an eye patch, wear it as told by your  eye care provider. ? Do not drive or use machinery while wearing an eye patch. Your ability to judge distances will be impaired. ? Follow instructions from your eye care provider about when to remove the patch.  Ask your eye care provider whether you can use a cold, wet cloth (compress) on your eye to relieve pain.  Do not rub or touch your eye. Do not wash out your eye.  Do not wear contact lenses until your eye care provider says that this is okay.  Avoid bright light and eye strain.  Keep all follow-up visits as told by your eye care provider. This is important for  preventing infection and vision loss. Contact a health care provider if:  You continue to have eye pain and other symptoms for more than 2 days.  You develop new symptoms, such as redness, tearing, or discharge.  You have discharge that makes your eyelids stick together in the morning.  Your eye patch becomes so loose that you can blink your eye.  Symptoms return after the original abrasion has healed. Get help right away if:  You have severe eye pain that does not get better with medicine.  You have vision loss. Summary  A corneal abrasion is a scratch on the outer layer of the clear covering over the front of your eye (cornea).  Corneal abrasion can cause eye pain, redness, tearing, and blurred vision.  This condition is usually treated with medicine to prevent infection and scarring. You also may have to wear an eye patch to cover your eye.  Let your eye care provider know if your symptoms continue for more than 2 days. This information is not intended to replace advice given to you by your health care provider. Make sure you discuss any questions you have with your health care provider. Document Released: 11/25/2000 Document Revised: 11/08/2016 Document Reviewed: 11/08/2016 Elsevier Interactive Patient Education  2019 [employer]   Allergic Conjunctivitis, Adult  Allergic conjunctivitis is inflammation of the clear membrane that covers the white part of your eye and the inner surface of your eyelid (conjunctiva). The inflammation is caused by allergies. The blood vessels in the conjunctiva become inflamed and this causes the eyes to become red or pink. The eyes often feel itchy. Allergic conjunctivitis cannot be spread from one person to another person (is not contagious). What are the causes? This condition is caused by an allergic reaction. Common causes of an allergic reaction (allergens) include:  Outdoor allergens, such as: ? Pollen. ? Grass and weeds. ? Mold  spores.  Indoor allergens, such as: ? Dust. ? Smoke. ? Mold. ? Pet dander. ? Animal hair. What increases the risk? You may be more likely to develop this condition if you have a family history of allergies, such as:  Allergic rhinitis.  Bronchial asthma.  Atopic dermatitis. What are the signs or symptoms? Symptoms of this condition include eyes that are:  Itchy.  Red.  Watery.  Puffy. Your eyes may also:  Sting or burn.  Have clear drainage coming from them. How is this diagnosed? This condition may be diagnosed by medical history and physical exam. If you have drainage from your eyes, it may be tested to rule out other causes of conjunctivitis. You may also need to see a health care provider who specializes in treating allergies (allergist) or eye conditions (ophthalmologist) for tests to confirm the diagnosis. You may have:  Skin tests to see which allergens are causing your symptoms. These tests involve  pricking the skin with a tiny needle and exposing the skin to small amounts of potential allergens to see if your skin reacts.  Blood tests.  Tissue scrapings from your eyelid. These will be examined under a microscope. How is this treated? Treatments for this condition may include:  Cold cloths (compresses) to soothe itching and swelling.  Washing the face to remove allergens.  Eye drops. These may be prescription or over-the-counter. There are several different types. You may need to try different types to see which one works best for you. Your may need: ? Eye drops that block the allergic reaction (antihistamine). ? Eye drops that reduce swelling and irritation (anti-inflammatory). ? Steroid eye drops to lessen a severe reaction (vernal conjunctivitis).  Oral antihistamine medicines to reduce your allergic reaction. You may need these if eye drops do not help or are difficult to use. Follow these instructions at home:  Avoid known allergens whenever  possible.  Take or apply over-the-counter and prescription medicines only as told by your health care provider. These include any eye drops.  Apply a cool, clean washcloth to your eye for 10-20 minutes, 3-4 times a day.  Do not touch or rub your eyes.  Do not wear contact lenses until the inflammation is gone. Wear glasses instead.  Do not wear eye makeup until the inflammation is gone.  Keep all follow-up visits as told by your health care provider. This is important. Contact a health care provider if:  Your symptoms get worse or do not improve with treatment.  You have mild eye pain.  You have sensitivity to light.  You have spots or blisters on your eyes.  You have pus draining from your eye.  You have a fever. Get help right away if:  You have redness, swelling, or other symptoms in only one eye.  Your vision is blurred or you have vision changes.  You have severe eye pain. This information is not intended to replace advice given to you by your health care provider. Make sure you discuss any questions you have with your health care provider. Document Released: 02/18/2003 Document Revised: 07/27/2016 Document Reviewed: 06/10/2016 Elsevier Interactive Patient Education  2019 [employer].

## 2019-04-22 ENCOUNTER — Telehealth: Payer: Self-pay

## 2019-04-22 NOTE — Telephone Encounter (Signed)
Pt called to provide Dr Denyse Amass with status update concerning appt on 04/18/19 for scratched cornea.   States that she is still having some trouble with her eye, but not as much. Does report that SX are worse in the mornings. Wanted to make sure that it is normal to be having some eye issues after several days. Please advise

## 2019-04-22 NOTE — Telephone Encounter (Signed)
Sent to Dr Linford Arnold in error. Sending to Dr Denyse Amass

## 2019-04-23 NOTE — Telephone Encounter (Signed)
I think it is okay that you are still having some symptoms over the should be improving.  If not improving by Thursday please let me know at that time will probably refer to ophthalmology.

## 2019-04-23 NOTE — Telephone Encounter (Signed)
Pt advised.

## 2019-06-08 ENCOUNTER — Other Ambulatory Visit: Payer: Self-pay | Admitting: Sports Medicine

## 2019-06-08 DIAGNOSIS — F32A Depression, unspecified: Secondary | ICD-10-CM

## 2019-06-08 DIAGNOSIS — F419 Anxiety disorder, unspecified: Secondary | ICD-10-CM

## 2019-06-08 DIAGNOSIS — F329 Major depressive disorder, single episode, unspecified: Secondary | ICD-10-CM

## 2019-06-13 DIAGNOSIS — N6001 Solitary cyst of right breast: Secondary | ICD-10-CM | POA: Diagnosis not present

## 2019-06-13 DIAGNOSIS — N63 Unspecified lump in unspecified breast: Secondary | ICD-10-CM | POA: Diagnosis not present

## 2019-06-26 ENCOUNTER — Other Ambulatory Visit: Payer: Self-pay

## 2019-06-26 ENCOUNTER — Telehealth (INDEPENDENT_AMBULATORY_CARE_PROVIDER_SITE_OTHER): Payer: BC Managed Care – PPO | Admitting: Family Medicine

## 2019-06-26 VITALS — Temp 98.0°F | Ht 69.0 in | Wt 125.0 lb

## 2019-06-26 DIAGNOSIS — R252 Cramp and spasm: Secondary | ICD-10-CM | POA: Diagnosis not present

## 2019-06-26 DIAGNOSIS — R42 Dizziness and giddiness: Secondary | ICD-10-CM | POA: Diagnosis not present

## 2019-06-26 DIAGNOSIS — F32A Depression, unspecified: Secondary | ICD-10-CM

## 2019-06-26 DIAGNOSIS — L988 Other specified disorders of the skin and subcutaneous tissue: Secondary | ICD-10-CM | POA: Diagnosis not present

## 2019-06-26 DIAGNOSIS — R635 Abnormal weight gain: Secondary | ICD-10-CM

## 2019-06-26 DIAGNOSIS — L989 Disorder of the skin and subcutaneous tissue, unspecified: Secondary | ICD-10-CM

## 2019-06-26 DIAGNOSIS — F419 Anxiety disorder, unspecified: Secondary | ICD-10-CM

## 2019-06-26 DIAGNOSIS — F329 Major depressive disorder, single episode, unspecified: Secondary | ICD-10-CM

## 2019-06-26 MED ORDER — BUSPIRONE HCL 5 MG PO TABS: 5.0000 mg | ORAL_TABLET | Freq: Every day | ORAL | 1 refills | Status: DC

## 2019-06-26 NOTE — Progress Notes (Signed)
Virtual Visit via Video Note  I connected with Melinda HeysSharon Bloodsworth on 06/26/19 at  9:30 AM EDT by a video enabled telemedicine application and verified that I am speaking with the correct person using two identifiers.   I discussed the limitations of evaluation and management by telemedicine and the availability of in person appointments. The patient expressed understanding and agreed to proceed.  Pt was at home and I was in my office for the virtual visit.     Established Patient Office Visit  Subjective:  Patient ID: Melinda Parsons, female    DOB: 01/03/1971  Age: 48 y.o. MRN: [id number]  CC:  Chief Complaint  Patient presents with  . mood    HPI Melinda HeysSharon Ciano presents for   She has been working from home. Has had more stress. Daughter graduated from [employer]   F/u anxiety/depression - she feels like the Buspar is working well.  She has been splitting them in half bc was almost out of medication.   Her leg cramps has been better. Seems to be worsened by stress.  The gabapentin does seem to help.  She has noticed she is more mentally sluggish. Wonders if could be the gabapentin causes the mental changes.    She just started weight watcher for weight loss and has started to walk regularly. Her weight is up and down.   Has had vertigo again. Started last week while at the beach. Mostly in the morning. Has been taking her sinus medication. Has occ ringing.  No hearing loss. No sinus pain or pressure.    She also has a few skin lesions that she would like to have looked at by dermatology and is requesting referral today.  Past Medical History:  Diagnosis Date  . Anxiety   . GERD (gastroesophageal reflux disease)   . Hypertension   . IBS (irritable bowel syndrome)   . Lumbar herniated disc   . Migraine   . Pre-eclampsia    with daughter  . PVC (premature ventricular contraction)    Previous holter in Dec 2011 showed PVCs and 2 3 beat runs of SVT  . Seasonal allergies     Past  Surgical History:  Procedure Laterality Date  . NO PAST SURGERIES      Family History  Problem Relation Age of Onset  . Hypertension Mother   . Hyperlipidemia Mother   . Breast cancer Mother [id number]  . Diverticulitis Father   . Hypertension Unknown        grandmother  . Alcohol abuse Unknown        grandparent    Social History   Socioeconomic History  . Marital status: Married    Spouse name: Not on file  . Number of children: 1  . Years of education: 3516  . Highest education level: Not on file  Occupational History  . Occupation: Optician, dispensingmanager of talent and org development.     Employer: [employer]    Comment: consultant  Social Needs  . Financial resource strain: Not on file  . Food insecurity    Worry: Not on file    Inability: Not on file  . Transportation needs    Medical: Not on file    Non-medical: Not on file  Tobacco Use  . Smoking status: Never Smoker  . Smokeless tobacco: Never Used  Substance and Sexual Activity  . Alcohol use: Yes    Alcohol/week: 1.0 - 2.0 standard drinks    Types: 1 - 2 Standard drinks or equivalent  per week    Comment: Socially  . Drug use: No  . 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  Lifestyle  . Physical activity    Days per week: Not on file    Minutes per session: Not on file  . Stress: Not on file  Relationships  . Social Musicianconnections    Talks on phone: Not on file    Gets together: Not on file    Attends religious service: Not on file    Active member of club or organization: Not on file    Attends meetings of clubs or organizations: Not on file    Relationship status: Not on file  . Intimate partner violence    Fear of current or ex partner: Not on file    Emotionally abused: Not on file    Physically abused: Not on file    Forced sexual activity: Not on file  Other Topics Concern  . Not on file  Social History Narrative   Lives at home  with her husband and daughter.   Left-handed.   4-6 cups caffeine per day.    Outpatient Medications Prior to Visit  Medication Sig Dispense Refill  . fexofenadine (ALLEGRA) 180 MG tablet Take 180 mg by mouth daily.    . fluticasone (FLONASE) 50 MCG/ACT nasal spray INSTILL 1 SPRAY INTO EACH NOSTRIL TWICE A DAY. USE LEFT HAND FOR RIGHT NOSTRIL, RIGHT HAND FOR LEFT 48 g 2  . gabapentin (NEURONTIN) 600 MG tablet TAKE 1 TABLET (600 MG TOTAL) BY MOUTH AT BEDTIME. 90 tablet 2  . hyoscyamine (LEVBID) 0.375 MG 12 hr tablet Take 0.375 mg by mouth 2 (two) times daily. She is taking 1 tablet daily    . NUVARING 0.12-0.015 MG/24HR vaginal ring INSERT ONE RING VAGINALLY ONCE A MONTH AS DIRECTED BY PHYSICIAN  3  . topiramate (TOPAMAX) 50 MG tablet Take 1 tablet (50 mg total) by mouth at bedtime. Needs appt for refills 30 tablet 0  . vitamin B-12 (CYANOCOBALAMIN) 1000 MCG tablet TAKE 1 TABLET (1,000 MCG TOTAL) BY MOUTH DAILY. 30 tablet 11  . busPIRone (BUSPAR) 5 MG tablet TAKE 1 TABLET BY MOUTH TWICE A DAY (Patient taking differently: Take 5 mg by mouth daily. ) 30 tablet 0   No facility-administered medications prior to visit.     No Known Allergies  ROS Review of Systems    Objective:    Physical Exam  Temp 98 F (36.7 C)   Ht 5\' 9"  (1.753 m)   Wt 125 lb (56.7 kg)   BMI 18.46 kg/m  Wt Readings from Last 3 Encounters:  06/26/19 125 lb (56.7 kg)  04/18/19 129 lb (58.5 kg)  02/11/19 127 lb 9.6 oz (57.9 kg)     There are no preventive care reminders to display for this patient.  There are no preventive care reminders to display for this patient.  Lab Results  Component Value Date   TSH 0.987 05/20/2015   Lab Results  Component Value Date   WBC 6.7 06/18/2018   HGB 15.2 06/18/2018   HCT 44.5 06/18/2018   MCV 92.7 06/18/2018   PLT 292 06/18/2018   Lab Results  Component Value Date   NA 139 06/18/2018   K 4.5 06/18/2018   CO2 22 06/18/2018   GLUCOSE 83 06/18/2018   BUN 11  06/18/2018   CREATININE 0.94 06/18/2018   BILITOT 0.5 06/18/2018  ALKPHOS 61 01/02/2017   AST 14 06/18/2018   ALT 12 06/18/2018   PROT 7.5 06/18/2018   ALBUMIN 3.9 01/02/2017   CALCIUM 9.4 06/18/2018   Lab Results  Component Value Date   CHOL 139 01/02/2017   Lab Results  Component Value Date   HDL 58 01/02/2017   Lab Results  Component Value Date   LDLCALC 61 01/02/2017   Lab Results  Component Value Date   TRIG 100 01/02/2017   Lab Results  Component Value Date   CHOLHDL 2.4 01/02/2017   Lab Results  Component Value Date   HGBA1C 4.9 04/22/2015      Assessment & Plan:   Problem List Items Addressed This Visit      Other   Leg cramp    Discussed options.  Monitor hydration. Will try splitting the gabapentin in half and see if just as effective but feels more mentally clear.        Anxiety and depression    Doing well on her regimen. Just wants to continue once a day instead of BID. med Refilled.       Relevant Medications   busPIRone (BUSPAR) 5 MG tablet    Other Visit Diagnoses    Vertigo    -  Primary   Abnormal weight gain       Skin lesion       Relevant Orders   Ambulatory referral to Dermatology      Vertigo - Google exercise for positional vertigo. If not improving after the weekend please let me know.  Call if any hearing changes or hearing loss or if fever or feels like she is getting worse or if it is just not improving.  Abnormal weight gain-has started weight watchers which I think is fantastic.  Call if continuing to struggle to lose weight.  I think she is on a great start.  Would like to be referred for a full skin check to dermatology.  Referral placed.  In regards to the mental fogginess this could be related to the gabapentin or the Topamax or the combination so we discussed decreasing the gabapentin to 300 mg to see if it still effective for her leg cramps but may be helps with some of the mental sharpness.  If not we can always  consider taking a look at the Topamax as well.  Meds ordered this encounter  Medications  . busPIRone (BUSPAR) 5 MG tablet    Sig: Take 1 tablet (5 mg total) by mouth daily.    Dispense:  90 tablet    Refill:  1    Needs physical appt for further refills.    Follow-up: No follow-ups on file.    I discussed the assessment and treatment plan with the patient. The patient was provided an opportunity to ask questions and all were answered. The patient agreed with the plan and demonstrated an understanding of the instructions.   The patient was advised to call back or seek an in-person evaluation if the symptoms worsen or if the condition fails to improve as anticipated.    Beatrice Lecher, MD

## 2019-06-26 NOTE — Progress Notes (Signed)
Called and lvm informing pt that I was calling to obtain her VS and go over her medications prior to her appt.Maryruth Eve, Lahoma Crocker, CMA   Called pt again and lvm like previous (928 AM).Elouise Munroe, CMA  Dr. Darene Lamer was writing the script for the Buspar and she was told to f/u with her PCP for this.

## 2019-06-26 NOTE — Assessment & Plan Note (Signed)
Doing well on her regimen. Just wants to continue once a day instead of BID. med Refilled.

## 2019-06-26 NOTE — Assessment & Plan Note (Signed)
Discussed options.  Monitor hydration. Will try splitting the gabapentin in half and see if just as effective but feels more mentally clear.

## 2019-07-15 ENCOUNTER — Encounter: Payer: Self-pay | Admitting: Family Medicine

## 2019-07-15 DIAGNOSIS — D1801 Hemangioma of skin and subcutaneous tissue: Secondary | ICD-10-CM | POA: Diagnosis not present

## 2019-07-15 DIAGNOSIS — D2271 Melanocytic nevi of right lower limb, including hip: Secondary | ICD-10-CM | POA: Diagnosis not present

## 2019-07-15 DIAGNOSIS — D485 Neoplasm of uncertain behavior of skin: Secondary | ICD-10-CM | POA: Diagnosis not present

## 2019-07-15 DIAGNOSIS — D2272 Melanocytic nevi of left lower limb, including hip: Secondary | ICD-10-CM | POA: Diagnosis not present

## 2019-07-15 DIAGNOSIS — D225 Melanocytic nevi of trunk: Secondary | ICD-10-CM | POA: Diagnosis not present

## 2019-08-20 ENCOUNTER — Other Ambulatory Visit: Payer: Self-pay | Admitting: *Deleted

## 2019-08-20 DIAGNOSIS — R194 Change in bowel habit: Secondary | ICD-10-CM | POA: Diagnosis not present

## 2019-08-20 DIAGNOSIS — K580 Irritable bowel syndrome with diarrhea: Secondary | ICD-10-CM | POA: Diagnosis not present

## 2019-08-20 DIAGNOSIS — M5030 Other cervical disc degeneration, unspecified cervical region: Secondary | ICD-10-CM

## 2019-08-20 DIAGNOSIS — K219 Gastro-esophageal reflux disease without esophagitis: Secondary | ICD-10-CM | POA: Diagnosis not present

## 2019-08-20 DIAGNOSIS — R1310 Dysphagia, unspecified: Secondary | ICD-10-CM | POA: Diagnosis not present

## 2019-08-20 MED ORDER — GABAPENTIN 600 MG PO TABS: 600.0000 mg | ORAL_TABLET | Freq: Every day | ORAL | 2 refills | Status: DC

## 2019-09-30 ENCOUNTER — Other Ambulatory Visit: Payer: Self-pay | Admitting: Family Medicine

## 2019-09-30 DIAGNOSIS — F32A Depression, unspecified: Secondary | ICD-10-CM

## 2019-09-30 DIAGNOSIS — F329 Major depressive disorder, single episode, unspecified: Secondary | ICD-10-CM

## 2019-10-08 DIAGNOSIS — K5730 Diverticulosis of large intestine without perforation or abscess without bleeding: Secondary | ICD-10-CM | POA: Diagnosis not present

## 2019-10-08 DIAGNOSIS — R1310 Dysphagia, unspecified: Secondary | ICD-10-CM | POA: Diagnosis not present

## 2019-10-08 DIAGNOSIS — Z8371 Family history of colonic polyps: Secondary | ICD-10-CM | POA: Diagnosis not present

## 2019-10-08 DIAGNOSIS — K219 Gastro-esophageal reflux disease without esophagitis: Secondary | ICD-10-CM | POA: Diagnosis not present

## 2019-10-08 DIAGNOSIS — K3189 Other diseases of stomach and duodenum: Secondary | ICD-10-CM | POA: Diagnosis not present

## 2019-10-08 DIAGNOSIS — Z1211 Encounter for screening for malignant neoplasm of colon: Secondary | ICD-10-CM | POA: Diagnosis not present

## 2019-10-08 LAB — HM COLONOSCOPY

## 2019-10-21 ENCOUNTER — Encounter: Payer: Self-pay | Admitting: Family Medicine

## 2019-10-22 DIAGNOSIS — Z6822 Body mass index (BMI) 22.0-22.9, adult: Secondary | ICD-10-CM | POA: Diagnosis not present

## 2019-10-22 DIAGNOSIS — Z0000 Encounter for general adult medical examination without abnormal findings: Secondary | ICD-10-CM | POA: Diagnosis not present

## 2019-10-22 DIAGNOSIS — Z01419 Encounter for gynecological examination (general) (routine) without abnormal findings: Secondary | ICD-10-CM | POA: Diagnosis not present

## 2019-10-22 DIAGNOSIS — Z1151 Encounter for screening for human papillomavirus (HPV): Secondary | ICD-10-CM | POA: Diagnosis not present

## 2019-10-25 LAB — RESULTS CONSOLE HPV: CHL HPV: NEGATIVE

## 2019-10-25 LAB — HM PAP SMEAR: HM Pap smear: NEGATIVE

## 2019-11-21 ENCOUNTER — Encounter: Payer: Self-pay | Admitting: Family Medicine

## 2020-01-22 ENCOUNTER — Telehealth: Payer: Self-pay

## 2020-01-22 DIAGNOSIS — F32A Depression, unspecified: Secondary | ICD-10-CM

## 2020-01-22 DIAGNOSIS — F329 Major depressive disorder, single episode, unspecified: Secondary | ICD-10-CM

## 2020-01-22 DIAGNOSIS — F419 Anxiety disorder, unspecified: Secondary | ICD-10-CM

## 2020-01-22 MED ORDER — BUSPIRONE HCL 5 MG PO TABS: 5.0000 mg | ORAL_TABLET | Freq: Two times a day (BID) | ORAL | 0 refills | Status: DC

## 2020-01-22 NOTE — Telephone Encounter (Signed)
Left pt msg advising RX sent and she needs f/u in 2 weeks

## 2020-01-22 NOTE — Telephone Encounter (Signed)
Med adjusted. Please haver her schedule f/u in 2 weeks to see how doing.  Ok for [employer]

## 2020-01-22 NOTE — Telephone Encounter (Signed)
Melinda Parsons has an increase in anxiety and would like to increase the buspirone to twice daily instead of once daily. Please advise.

## 2020-01-27 NOTE — Telephone Encounter (Signed)
Patient advised and scheduled.  

## 2020-02-04 ENCOUNTER — Encounter: Payer: Self-pay | Admitting: Family Medicine

## 2020-02-04 ENCOUNTER — Telehealth (INDEPENDENT_AMBULATORY_CARE_PROVIDER_SITE_OTHER): Payer: 59 | Admitting: Family Medicine

## 2020-02-04 DIAGNOSIS — F329 Major depressive disorder, single episode, unspecified: Secondary | ICD-10-CM

## 2020-02-04 DIAGNOSIS — R002 Palpitations: Secondary | ICD-10-CM | POA: Diagnosis not present

## 2020-02-04 DIAGNOSIS — K219 Gastro-esophageal reflux disease without esophagitis: Secondary | ICD-10-CM | POA: Diagnosis not present

## 2020-02-04 DIAGNOSIS — F419 Anxiety disorder, unspecified: Secondary | ICD-10-CM

## 2020-02-04 MED ORDER — RABEPRAZOLE SODIUM 20 MG PO TBEC: 20.0000 mg | DELAYED_RELEASE_TABLET | Freq: Every day | ORAL | 5 refills | Status: DC

## 2020-02-04 NOTE — Progress Notes (Signed)
Pt reports that she is unsure if the pantoprazole is causing her palpitations or if this is coming from her stress levels. She began taking this around the time she got her colonoscopy done and said that it does really help with the acid reflux sxs.  Ringing in ears on and off for several days. She has only been taking allergy meds and wondered if she should take a decongestant for this? She denies any other sxs.

## 2020-02-04 NOTE — Progress Notes (Signed)
Virtual Visit via Video Note  I connected with Melinda Parsons on 02/04/20 at  1:00 PM EST by a video enabled telemedicine application and verified that I am speaking with the correct person using two identifiers.   I discussed the limitations of evaluation and management by telemedicine and the availability of in person appointments. The patient expressed understanding and agreed to proceed.  Subjective:    CC:   HPI: Pt reports that she is unsure if the pantoprazole is causing her palpitations or if this is coming from her stress levels. She began taking this around the time she got her colonoscopy done and said that it does really help with the acid reflux sxs.  So she wonders if the medication could be contributing.  She says once in a while she would feel a little skipped beat in her chest but this has become much more frequent to the point that she is experiencing the palpitations daily.  And definitely worsens when she is feeling stressed or anxious or has to give a presentation at work for example.  But she is also noticed that she is not caring about things quite as much.  Most a little bit apathetic.  She denies any change or increase in caffeine intake though she does drink a small amount daily.  She denies taking any NSAIDs or decongestants recently.  Sleep for the most part has been okay.  Make no major changes there.  She did have a colonoscopy in October.  Ringing in ears on and off for several days. She has only been taking allergy meds and wondered if she should take a decongestant for this? She denies any other sxs.   Past medical history, Surgical history, Family history not pertinant except as noted below, Social history, Allergies, and medications have been entered into the medical record, reviewed, and corrections made.   Review of Systems: No fevers, chills, night sweats, weight loss, chest pain, or shortness of breath.   Objective:    General: Speaking clearly in complete  sentences without any shortness of breath.  Alert and oriented x3.  Normal judgment. No apparent acute distress.    Impression and Recommendations:    No problem-specific Assessment & Plan notes found for this encounter.  Palpitations-we discussed options.  We can certainly switch to a different PPI just to make sure that that is not potential cause since she does feel like they ramped up around the time that she was started on the pantoprazole.  She says that has really helped with her reflux symptoms so does not want to discontinue it at this point.  We discussed the palpitations could be mood related especially noticing some increased anxiousness anxiety and maybe even a little bit of increase in apathy as well.  We discussed ruling out thyroid disease, anemia, electrolyte disturbance.  And then moving forward with echocardiogram and Zio patch.  Could also be PVCs.  GERD-we will switch pantoprazole to AcipHex.  Anxiety/depression-she actually feels like the BuSpar has been helpful she is currently taking 5 mg twice a day.  We discussed options including going up on the medication to 3 times a day or possibly increasing the dose.  She was worried about it making her feel more flat.  She says she thinks she might just try taking an extra half a tab at lunch just to see how she does.    Time spent in encounter 25 minutes  I discussed the assessment and treatment plan with the patient.  The patient was provided an opportunity to ask questions and all were answered. The patient agreed with the plan and demonstrated an understanding of the instructions.   The patient was advised to call back or seek an in-person evaluation if the symptoms worsen or if the condition fails to improve as anticipated.   Beatrice Lecher, MD

## 2020-02-06 LAB — HM MAMMOGRAPHY

## 2020-02-08 LAB — COMPLETE METABOLIC PANEL WITH GFR
AG Ratio: 1.7 (calc) (ref 1.0–2.5)
ALT: 17 U/L (ref 6–29)
AST: 23 U/L (ref 10–35)
Albumin: 4.3 g/dL (ref 3.6–5.1)
Alkaline phosphatase (APISO): 38 U/L (ref 31–125)
BUN: 10 mg/dL (ref 7–25)
CO2: 29 mmol/L (ref 20–32)
Calcium: 9.1 mg/dL (ref 8.6–10.2)
Chloride: 100 mmol/L (ref 98–110)
Creat: 0.8 mg/dL (ref 0.50–1.10)
GFR, Est African American: 102 mL/min/{1.73_m2} (ref >=60)
GFR, Est Non African American: 88 mL/min/{1.73_m2} (ref >=60)
Globulin: 2.6 g/dL (calc) (ref 1.9–3.7)
Glucose, Bld: 97 mg/dL (ref 65–99)
Potassium: 4.4 mmol/L (ref 3.5–5.3)
Sodium: 138 mmol/L (ref 135–146)
Total Bilirubin: 0.6 mg/dL (ref 0.2–1.2)
Total Protein: 6.9 g/dL (ref 6.1–8.1)

## 2020-02-08 LAB — CBC WITH DIFFERENTIAL/PLATELET
Absolute Monocytes: 376 cells/uL (ref 200–950)
Basophils Absolute: 71 cells/uL (ref 0–200)
Basophils Relative: 1.5 %
Eosinophils Absolute: 122 cells/uL (ref 15–500)
Eosinophils Relative: 2.6 %
HCT: 41.9 % (ref 35.0–45.0)
Hemoglobin: 13.9 g/dL (ref 11.7–15.5)
Lymphs Abs: 1739 cells/uL (ref 850–3900)
MCH: 31.8 pg (ref 27.0–33.0)
MCHC: 33.2 g/dL (ref 32.0–36.0)
MCV: 95.9 fL (ref 80.0–100.0)
MPV: 10.6 fL (ref 7.5–12.5)
Monocytes Relative: 8 %
Neutro Abs: 2392 cells/uL (ref 1500–7800)
Neutrophils Relative %: 50.9 %
Platelets: 269 10*3/uL (ref 140–400)
RBC: 4.37 10*6/uL (ref 3.80–5.10)
RDW: 11.8 % (ref 11.0–15.0)
Total Lymphocyte: 37 %
WBC: 4.7 10*3/uL (ref 3.8–10.8)

## 2020-02-08 LAB — TSH: TSH: 0.77 mIU/L

## 2020-02-10 NOTE — Progress Notes (Signed)
All labs are normal. 

## 2020-02-13 LAB — HM MAMMOGRAPHY

## 2020-02-18 ENCOUNTER — Ambulatory Visit (HOSPITAL_BASED_OUTPATIENT_CLINIC_OR_DEPARTMENT_OTHER): Payer: 59

## 2020-02-20 ENCOUNTER — Encounter: Payer: Self-pay | Admitting: Family Medicine

## 2020-05-01 ENCOUNTER — Other Ambulatory Visit: Payer: Self-pay | Admitting: Family Medicine

## 2020-05-01 DIAGNOSIS — F32A Depression, unspecified: Secondary | ICD-10-CM

## 2020-06-29 ENCOUNTER — Encounter: Payer: Self-pay | Admitting: Family Medicine

## 2020-06-29 ENCOUNTER — Ambulatory Visit: Payer: 59 | Admitting: Family Medicine

## 2020-06-29 ENCOUNTER — Other Ambulatory Visit: Payer: Self-pay

## 2020-06-29 VITALS — BP 144/92 | HR 86 | Ht 68.9 in | Wt 128.8 lb

## 2020-06-29 DIAGNOSIS — M5030 Other cervical disc degeneration, unspecified cervical region: Secondary | ICD-10-CM | POA: Diagnosis not present

## 2020-06-29 DIAGNOSIS — I7300 Raynaud's syndrome without gangrene: Secondary | ICD-10-CM | POA: Diagnosis not present

## 2020-06-29 DIAGNOSIS — K120 Recurrent oral aphthae: Secondary | ICD-10-CM | POA: Insufficient documentation

## 2020-06-29 MED ORDER — TRIAMCINOLONE ACETONIDE 0.1 % MT PSTE: 1.0000 "application " | PASTE | Freq: Two times a day (BID) | OROMUCOSAL | 12 refills | Status: DC | PRN

## 2020-06-29 MED ORDER — PREDNISONE 10 MG (21) PO TBPK: ORAL_TABLET | ORAL | 0 refills | Status: DC

## 2020-06-29 NOTE — Patient Instructions (Signed)
Great to meet you today! Start prednisone taper.  If not helping try going back up to 600mg  of gabapentin.  Let know if this continues to be bothersome.   Try triamcinolone paste as needed for ulcerations.

## 2020-06-29 NOTE — Assessment & Plan Note (Signed)
Providing trial of triamcinolone paste as needed.

## 2020-06-29 NOTE — Progress Notes (Signed)
Melinda Parsons - 49 y.o. female MRN [id number]  Date of birth: 09/30/1971  Subjective Chief Complaint  Patient presents with  . Sore Throat  . Oral Swelling  . Tingling    HPI Melinda Parsons is a 49 y.o. female with history of DDD of the cervical spine here today with complaint of increased symptoms of tingling into bilateral extremities.  She has had radicular symptoms and has been  taking gabapentin for this for a while now.  Tingling sensation is new and started a couple of weeks ago.  She denies weakness of upper extremities of hands.  She has had some color changes of some of her fingers at times, improved with running under warm water.    She also has had some sores in her mouth.  This is a chronic, recurrent problem.  She denies bleeding of ulcerations.  She has not had fevers, sore throat, genital ulcers or joint pain.    ROS:  A comprehensive ROS was completed and negative except as noted per HPI  No Known Allergies  Past Medical History:  Diagnosis Date  . Anxiety   . GERD (gastroesophageal reflux disease)   . Hypertension   . IBS (irritable bowel syndrome)   . Lumbar herniated disc   . Migraine   . Pre-eclampsia    with daughter  . PVC (premature ventricular contraction)    Previous holter in Dec 2011 showed PVCs and 2 3 beat runs of SVT  . Seasonal allergies     Past Surgical History:  Procedure Laterality Date  . NO PAST SURGERIES      Social History   Socioeconomic History  . Marital status: Married    Spouse name: Not on file  . Number of children: 1  . Years of education: 45  . Highest education level: Not on file  Occupational History  . Occupation: Optician, dispensing.     Employer: [employer]    Comment: consultant  Tobacco Use  . Smoking status: Never Smoker  . Smokeless tobacco: Never Used  Substance and Sexual Activity  . Alcohol use: Yes    Alcohol/week: 1.0 - 2.0 standard drink    Types: 1 - 2 Standard drinks or  equivalent per week    Comment: Socially  . Drug use: No  . 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  Other Topics Concern  . Not on file  Social History Narrative   Lives at home with her husband and daughter.   Left-handed.   4-6 cups caffeine per day.   Social Determinants of Health   Financial Resource Strain:   . Difficulty of Paying Living Expenses:   Food Insecurity:   . Worried About Programme researcher, broadcasting/film/video in the Last Year:   . Barista in the Last Year:   Transportation Needs:   . Freight forwarder (Medical):   Marland Kitchen Lack of Transportation (Non-Medical):   Physical Activity:   . Days of Exercise per Week:   . Minutes of Exercise per Session:   Stress:   . Feeling of Stress :   Social Connections:   . Frequency of Communication with Friends and Family:   . Frequency of Social Gatherings with Friends and Family:   . Attends Religious Services:   . Active Member of Clubs or Organizations:   . Attends Banker Meetings:   Marland Kitchen Marital  Status:     Family History  Problem Relation Age of Onset  . Hypertension Mother   . Hyperlipidemia Mother   . Breast cancer Mother 1  . Diverticulitis Father   . Hypertension Unknown        grandmother  . Alcohol abuse Unknown        grandparent    Health Maintenance  Topic Date Due  . COVID-19 Vaccine (1) Never done  . INFLUENZA VACCINE  07/12/2020  . MAMMOGRAM  02/12/2022  . PAP SMEAR-Modifier  10/24/2022  . TETANUS/TDAP  10/28/2024  . Hepatitis C Screening  Completed  . HIV Screening  Completed     ----------------------------------------------------------------------------------------------------------------------------------------------------------------------------------------------------------------- Physical Exam BP (!) 144/92 (BP Location: Left Arm, Patient Position: Sitting, Cuff Size: Normal)   Pulse 86   Ht 5' 8.9"  (1.75 m)   Wt 128 lb 12.8 oz (58.4 kg)   SpO2 100%   BMI 19.08 kg/m   Physical Exam Constitutional:      Appearance: Normal appearance.  HENT:     Head: Normocephalic and atraumatic.     Mouth/Throat:     Mouth: Mucous membranes are moist.     Comments: Small ulcerations along tongue and inside of lower lip.  Eyes:     General: No scleral icterus. Neck:     Comments: + Spurling on R.  Cardiovascular:     Rate and Rhythm: Normal rate and regular rhythm.  Pulmonary:     Effort: Pulmonary effort is normal.     Breath sounds: Normal breath sounds.  Musculoskeletal:     Cervical back: Normal range of motion and neck supple.  Neurological:     General: No focal deficit present.     Mental Status: She is alert.     Comments: Strength in upper extremities is normal.    Psychiatric:        Mood and Affect: Mood normal.        Behavior: Behavior normal.     ------------------------------------------------------------------------------------------------------------------------------------------------------------------------------------------------------------------- Assessment and Plan  Degenerative disc disease, cervical Increased radicular symptoms, now with tingling sensation.  Will try addition of steroid taper.  She will also try going back up on gabapentin to 600mg  We can consider repeat MRI if her symptoms are not improving.   Canker sore Providing trial of triamcinolone paste as needed.    Meds ordered this encounter  Medications  . triamcinolone (KENALOG) 0.1 % paste    Sig: Use as directed 1 application in the mouth or throat 2 (two) times daily as needed. For ulceration    Dispense:  5 g    Refill:  12  . predniSONE (STERAPRED UNI-PAK 21 TAB) 10 MG (21) TBPK tablet    Sig: Taper as directed on packaging    Dispense:  21 tablet    Refill:  0    No follow-ups on file.    This visit occurred during the SARS-CoV-2 public health emergency.  Safety protocols  were in place, including screening questions prior to the visit, additional usage of staff PPE, and extensive cleaning of exam room while observing appropriate contact time as indicated for disinfecting solutions.

## 2020-06-29 NOTE — Assessment & Plan Note (Signed)
Increased radicular symptoms, now with tingling sensation.  Will try addition of steroid taper.  She will also try going back up on gabapentin to 600mg  We can consider repeat MRI if her symptoms are not improving.

## 2020-06-30 LAB — SEDIMENTATION RATE: Sed Rate: 9 mm/h (ref 0–20)

## 2020-07-01 ENCOUNTER — Encounter: Payer: Self-pay | Admitting: Family Medicine

## 2020-07-07 ENCOUNTER — Encounter: Payer: Self-pay | Admitting: Family Medicine

## 2020-07-07 ENCOUNTER — Ambulatory Visit (INDEPENDENT_AMBULATORY_CARE_PROVIDER_SITE_OTHER): Payer: No Typology Code available for payment source | Admitting: Family Medicine

## 2020-07-07 ENCOUNTER — Other Ambulatory Visit: Payer: Self-pay

## 2020-07-07 ENCOUNTER — Ambulatory Visit (INDEPENDENT_AMBULATORY_CARE_PROVIDER_SITE_OTHER): Payer: No Typology Code available for payment source

## 2020-07-07 VITALS — BP 147/101 | HR 84 | Ht 68.9 in | Wt 127.0 lb

## 2020-07-07 DIAGNOSIS — M4802 Spinal stenosis, cervical region: Secondary | ICD-10-CM

## 2020-07-07 DIAGNOSIS — E538 Deficiency of other specified B group vitamins: Secondary | ICD-10-CM

## 2020-07-07 DIAGNOSIS — M5030 Other cervical disc degeneration, unspecified cervical region: Secondary | ICD-10-CM | POA: Diagnosis not present

## 2020-07-07 DIAGNOSIS — M5412 Radiculopathy, cervical region: Secondary | ICD-10-CM

## 2020-07-07 DIAGNOSIS — R202 Paresthesia of skin: Secondary | ICD-10-CM | POA: Diagnosis not present

## 2020-07-07 DIAGNOSIS — R030 Elevated blood-pressure reading, without diagnosis of hypertension: Secondary | ICD-10-CM

## 2020-07-07 LAB — CBC
HCT: 45.7 % — ABNORMAL HIGH (ref 35.0–45.0)
Hemoglobin: 15.1 g/dL (ref 11.7–15.5)
MCH: 31.7 pg (ref 27.0–33.0)
MCHC: 33 g/dL (ref 32.0–36.0)
MCV: 96 fL (ref 80.0–100.0)
MPV: 9.9 fL (ref 7.5–12.5)
Platelets: 314 10*3/uL (ref 140–400)
RBC: 4.76 10*6/uL (ref 3.80–5.10)
RDW: 12.1 % (ref 11.0–15.0)
WBC: 8.1 10*3/uL (ref 3.8–10.8)

## 2020-07-07 LAB — VITAMIN B12: Vitamin B-12: 1110 pg/mL — ABNORMAL HIGH (ref 200–1100)

## 2020-07-07 MED ORDER — CYCLOBENZAPRINE HCL 10 MG PO TABS: 5.0000 mg | ORAL_TABLET | Freq: Every evening | ORAL | 0 refills | Status: DC | PRN

## 2020-07-07 MED ORDER — TRAMADOL HCL 50 MG PO TABS: 50.0000 mg | ORAL_TABLET | Freq: Three times a day (TID) | ORAL | 0 refills | Status: AC | PRN

## 2020-07-07 NOTE — Progress Notes (Signed)
Established Patient Office Visit  Subjective:  Patient ID: Melinda Parsons, female    DOB: 11-08-1971  Age: 49 y.o. MRN: [id number]  CC:  Chief Complaint  Patient presents with  . Neck Pain  . Arm Pain    HPI Melinda Parsons presents for Neck pain radiating into the left arm and hand causing some burning and tingling sensation.  She recently helped her husband with some mulch in the yard this past weekend and aggravated it the symptoms.  She saw Dr. Ashley Parsons on July 19 for similar symptoms she has a known history of degenerative disc disease of the cervical spine she had actually been on gabapentin at that point.  She had had new onset of tingling and radicular symptoms.  He gave her a steroid taper which she felt was not helpful and discussed increasing her gabapentin back to 600 mg.  He did recommend considering MRI for further evaluation if she was not improving.  She is concerned because she is also been getting some cramping in her palms.  She said she felt bad enough last night that she almost went to the emergency department.  Now the pain is radiating down her spine and up into the base of her head.  He also complains of some tingling in her toes.  She actually had epidural injections in the cervical region back in 2016 for right sided radiculopathy.  Last cervical MRI it was July 2016.  It noted bulging disc and facet hypertrophy with mild spinal stenosis and severe by foraminal stenosis at C5-6 and similar findings at C6-7 without the severe biforaminal stenosis.    Past Medical History:  Diagnosis Date  . Anxiety   . GERD (gastroesophageal reflux disease)   . Hypertension   . IBS (irritable bowel syndrome)   . Lumbar herniated disc   . Migraine   . Pre-eclampsia    with daughter  . PVC (premature ventricular contraction)    Previous holter in Dec 2011 showed PVCs and 2 3 beat runs of SVT  . Seasonal allergies     Past Surgical History:  Procedure Laterality Date  . NO  PAST SURGERIES      Family History  Problem Relation Age of Onset  . Hypertension Mother   . Hyperlipidemia Mother   . Breast cancer Mother 33  . Diverticulitis Father   . Hypertension Unknown        grandmother  . Alcohol abuse Unknown        grandparent    Social History   Socioeconomic History  . Marital status: Married    Spouse name: Not on file  . Number of children: 1  . Years of education: 59  . Highest education level: Not on file  Occupational History  . Occupation: Optician, dispensing.     Employer: [employer]    Comment: consultant  Tobacco Use  . Smoking status: Never Smoker  . Smokeless tobacco: Never Used  Substance and Sexual Activity  . Alcohol use: Yes    Alcohol/week: 1.0 - 2.0 standard drink    Types: 1 - 2 Standard drinks or equivalent per week    Comment: Socially  . Drug use: No  . 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  Other Topics Concern  . Not on file  Social History Narrative   Lives at home with her husband and daughter.  Left-handed.   4-6 cups caffeine per day.   Social Determinants of Health   Financial Resource Strain:   . Difficulty of Paying Living Expenses:   Food Insecurity:   . Worried About Programme researcher, broadcasting/film/video in the Last Year:   . Barista in the Last Year:   Transportation Needs:   . Freight forwarder (Medical):   Marland Kitchen Lack of Transportation (Non-Medical):   Physical Activity:   . Days of Exercise per Week:   . Minutes of Exercise per Session:   Stress:   . Feeling of Stress :   Social Connections:   . Frequency of Communication with Friends and Family:   . Frequency of Social Gatherings with Friends and Family:   . Attends Religious Services:   . Active Member of Clubs or Organizations:   . Attends Banker Meetings:   Marland Kitchen Marital Status:   Intimate Partner Violence:   . Fear of Current  or Ex-Partner:   . Emotionally Abused:   Marland Kitchen Physically Abused:   . Sexually Abused:     Outpatient Medications Prior to Visit  Medication Sig Dispense Refill  . busPIRone (BUSPAR) 5 MG tablet Take 1 tablet (5 mg total) by mouth 2 (two) times daily. PLEASE SCHEDULE AN OFFICE VISIT FOR FUTURE REFILLS 180 tablet 0  . fexofenadine (ALLEGRA) 180 MG tablet Take 180 mg by mouth daily.    . fluticasone (FLONASE) 50 MCG/ACT nasal spray INSTILL 1 SPRAY INTO EACH NOSTRIL TWICE A DAY. USE LEFT HAND FOR RIGHT NOSTRIL, RIGHT HAND FOR LEFT 48 g 2  . gabapentin (NEURONTIN) 600 MG tablet Take 1 tablet (600 mg total) by mouth at bedtime. 90 tablet 2  . hyoscyamine (LEVBID) 0.375 MG 12 hr tablet Take 0.375 mg by mouth 2 (two) times daily. She is taking 1 tablet daily    . NUVARING 0.12-0.015 MG/24HR vaginal ring INSERT ONE RING VAGINALLY ONCE A MONTH AS DIRECTED BY PHYSICIAN  3  . RABEprazole (ACIPHEX) 20 MG tablet Take 1 tablet (20 mg total) by mouth daily. 30 tablet 5  . topiramate (TOPAMAX) 50 MG tablet Take 1 tablet (50 mg total) by mouth at bedtime. Needs appt for refills 30 tablet 0  . triamcinolone (KENALOG) 0.1 % paste Use as directed 1 application in the mouth or throat 2 (two) times daily as needed. For ulceration 5 g 12  . vitamin B-12 (CYANOCOBALAMIN) 1000 MCG tablet TAKE 1 TABLET (1,000 MCG TOTAL) BY MOUTH DAILY. 30 tablet 11  . pantoprazole (PROTONIX) 40 MG tablet Take 40 mg by mouth every morning.    . predniSONE (STERAPRED UNI-PAK 21 TAB) 10 MG (21) TBPK tablet Taper as directed on packaging 21 tablet 0   No facility-administered medications prior to visit.    No Known Allergies  ROS Review of Systems    Objective:    Physical Exam Vitals reviewed.  Constitutional:      Appearance: She is well-developed.  HENT:     Head: Normocephalic and atraumatic.  Eyes:     Conjunctiva/sclera: Conjunctivae normal.  Cardiovascular:     Rate and Rhythm: Normal rate.  Pulmonary:     Effort:  Pulmonary effort is normal.  Skin:    General: Skin is dry.     Coloration: Skin is not pale.  Neurological:     Mental Status: She is alert and oriented to person, place, and time.  Psychiatric:        Behavior: Behavior normal.  BP (!) 147/101   Pulse 84   Ht 5' 8.9" (1.75 m)   Wt 127 lb (57.6 kg)   LMP 07/06/2020   SpO2 100%   BMI 18.81 kg/m  Wt Readings from Last 3 Encounters:  07/07/20 127 lb (57.6 kg)  06/29/20 128 lb 12.8 oz (58.4 kg)  06/26/19 125 lb (56.7 kg)     There are no preventive care reminders to display for this patient.  There are no preventive care reminders to display for this patient.  Lab Results  Component Value Date   TSH 0.77 02/07/2020   Lab Results  Component Value Date   WBC 4.7 02/07/2020   HGB 13.9 02/07/2020   HCT 41.9 02/07/2020   MCV 95.9 02/07/2020   PLT 269 02/07/2020   Lab Results  Component Value Date   NA 138 02/07/2020   K 4.4 02/07/2020   CO2 29 02/07/2020   GLUCOSE 97 02/07/2020   BUN 10 02/07/2020   CREATININE 0.80 02/07/2020   BILITOT 0.6 02/07/2020   ALKPHOS 61 01/02/2017   AST 23 02/07/2020   ALT 17 02/07/2020   PROT 6.9 02/07/2020   ALBUMIN 3.9 01/02/2017   CALCIUM 9.1 02/07/2020   Lab Results  Component Value Date   CHOL 139 01/02/2017   Lab Results  Component Value Date   HDL 58 01/02/2017   Lab Results  Component Value Date   LDLCALC 61 01/02/2017   Lab Results  Component Value Date   TRIG 100 01/02/2017   Lab Results  Component Value Date   CHOLHDL 2.4 01/02/2017   Lab Results  Component Value Date   HGBA1C 4.9 04/22/2015      Assessment & Plan:   Problem List Items Addressed This Visit      Musculoskeletal and Integument   Degenerative disc disease, cervical - Primary   Relevant Medications   cyclobenzaprine (FLEXERIL) 10 MG tablet   traMADol (ULTRAM) 50 MG tablet   Other Relevant Orders   DG Cervical Spine Complete   Ambulatory referral to Physical Therapy   MR  Cervical Spine Wo Contrast     Other   Vitamin B12 deficiency   Relevant Orders   B12   Spinal stenosis in cervical region   Relevant Orders   MR Cervical Spine Wo Contrast    Other Visit Diagnoses    Cervical radiculopathy       Relevant Medications   cyclobenzaprine (FLEXERIL) 10 MG tablet   traMADol (ULTRAM) 50 MG tablet   Other Relevant Orders   DG Cervical Spine Complete   Ambulatory referral to Physical Therapy   CBC   MR Cervical Spine Wo Contrast   Paresthesias       Relevant Orders   CBC   B12   Elevated blood pressure reading          Degenerative disc disease of the cervical spine with some spinal stenosis-we discussed that this may have progressed over the last 5 years which is when she had the last MRI and 2 epidurals.  In the short-term we can go ahead and treat her with tramadol and muscle relaxer in addition to her continuing her gabapentin which she had recently increased back to 600 mg.  She reports that she also got some relief with phentermine in the past but this is a little unusual.  It certainly may have helped to some fatigue and energy levels but am not sure how it would have helped with radicular symptoms.  So can  hold off on that for right now.  We can also go ahead and get cervical spine films as we will likely move forward with MRI of the cervical spine for further evaluation to see if her condition has actually progressed or worsened over the last 5 years.  She has benefited from more formal physical therapy in the past so we will can place referral for that.  She actually has a massage scheduled later this week and encouraged her to keep that appointment just let them know what is going on so they do not do any hyperextension during the treatment session.  But certainly working on the muscle tissue can be helpful.  Did not treat with prednisone as she felt that it really was not that helpful 2 weeks ago.  Paresthesias-I think it is related to the  significant narrowing of the foramen in the cervical spine but she also has a history of B12 deficiency and that has not been checked in several years as well as would like to make sure that she is not deficient again.  Elevated blood pressure today-she is in significant discomfort so we will keep an eye on this and have her come back in about 3 weeks to make sure that she is hopefully improving and hopefully might even have the MRI results by then and we can go over those.  We will recheck her blood pressure at that time.  Meds ordered this encounter  Medications  . cyclobenzaprine (FLEXERIL) 10 MG tablet    Sig: Take 0.5-1 tablets (5-10 mg total) by mouth at bedtime as needed for muscle spasms.    Dispense:  30 tablet    Refill:  0  . traMADol (ULTRAM) 50 MG tablet    Sig: Take 1 tablet (50 mg total) by mouth every 8 (eight) hours as needed for up to 5 days.    Dispense:  20 tablet    Refill:  0    Follow-up: Return in about 2 weeks (around 07/21/2020) for F/U MRI.  Time spent in encounter 35 minutes including reviewing notes and prior imaging and epidurals etc.  Nani Gasser, MD

## 2020-07-12 ENCOUNTER — Other Ambulatory Visit: Payer: Self-pay

## 2020-07-12 ENCOUNTER — Ambulatory Visit (INDEPENDENT_AMBULATORY_CARE_PROVIDER_SITE_OTHER): Payer: No Typology Code available for payment source

## 2020-07-12 DIAGNOSIS — M5030 Other cervical disc degeneration, unspecified cervical region: Secondary | ICD-10-CM | POA: Diagnosis not present

## 2020-07-12 DIAGNOSIS — M5412 Radiculopathy, cervical region: Secondary | ICD-10-CM | POA: Diagnosis not present

## 2020-07-12 DIAGNOSIS — M4802 Spinal stenosis, cervical region: Secondary | ICD-10-CM | POA: Diagnosis not present

## 2020-07-15 ENCOUNTER — Ambulatory Visit (INDEPENDENT_AMBULATORY_CARE_PROVIDER_SITE_OTHER): Payer: No Typology Code available for payment source | Admitting: Sports Medicine

## 2020-07-15 DIAGNOSIS — M5030 Other cervical disc degeneration, unspecified cervical region: Secondary | ICD-10-CM

## 2020-07-15 DIAGNOSIS — G43109 Migraine with aura, not intractable, without status migrainosus: Secondary | ICD-10-CM | POA: Diagnosis not present

## 2020-07-15 MED ORDER — TROKENDI XR 25 MG PO CP24: 1.0000 | ORAL_CAPSULE | Freq: Every day | ORAL | 3 refills | Status: DC

## 2020-07-15 NOTE — Assessment & Plan Note (Signed)
Of note she does have some right-sided hemifacial numbness, I explained that this is unlikely to be coming from the cervical spine, she does have a history of migraines, complicated, and has discontinued her Topamax. I think is important to get her back on Topamax, we will use Trokendi this time as it does not come with the cognitive side effects, she did have a brain MRI approximately 5 years ago that showed nonspecific white matter changes consistent with migraines, for this reason I do not think we need to pull the trigger for an updated brain MRI, she is likely suffering from acephalgic migraines with a hemifacial numbness without headaches. This will likely go away as we up taper her Trokendi. Currently having migraine and hemifacial numbness days just about every day of the week.

## 2020-07-15 NOTE — Progress Notes (Signed)
    Procedures performed today:    None.  Independent interpretation of notes and tests performed by another provider:   None.  Brief History, Exam, Impression, and Recommendations:    Degenerative disc disease, cervical This is a pleasant 49 year old female, she has a known history of cervical DDD, she has had epidurals, the last of which was back in 2016. Now having recurrence of symptoms, updated MRI shows C5-C6 central canal stenosis. I do think is time for another cervical epidural, I would like Dr. Laurian Brim to weigh in here. She will get an MRI disc for him to review.  Complicated migraine Of note she does have some right-sided hemifacial numbness, I explained that this is unlikely to be coming from the cervical spine, she does have a history of migraines, complicated, and has discontinued her Topamax. I think is important to get her back on Topamax, we will use Trokendi this time as it does not come with the cognitive side effects, she did have a brain MRI approximately 5 years ago that showed nonspecific white matter changes consistent with migraines, for this reason I do not think we need to pull the trigger for an updated brain MRI, she is likely suffering from acephalgic migraines with a hemifacial numbness without headaches. This will likely go away as we up taper her Trokendi. Currently having migraine and hemifacial numbness days just about every day of the week.    ___________________________________________ Ihor Austin. Benjamin Stain, M.D., ABFM., CAQSM. Primary Care and Sports Medicine [facility]  Adjunct Instructor of Family Medicine  [employer], [employer], [employer]

## 2020-07-15 NOTE — Assessment & Plan Note (Signed)
This is a pleasant 49 year old female, she has a known history of cervical DDD, she has had epidurals, the last of which was back in 2016. Now having recurrence of symptoms, updated MRI shows C5-C6 central canal stenosis. I do think is time for another cervical epidural, I would like Dr. Laurian Brim to weigh in here. She will get an MRI disc for him to review.

## 2020-07-18 ENCOUNTER — Other Ambulatory Visit: Payer: Self-pay | Admitting: Family Medicine

## 2020-07-18 DIAGNOSIS — F32A Depression, unspecified: Secondary | ICD-10-CM

## 2020-07-21 ENCOUNTER — Ambulatory Visit: Payer: No Typology Code available for payment source | Admitting: Family Medicine

## 2020-07-22 ENCOUNTER — Ambulatory Visit (INDEPENDENT_AMBULATORY_CARE_PROVIDER_SITE_OTHER): Payer: No Typology Code available for payment source | Admitting: Rehabilitative and Restorative Service Providers"

## 2020-07-22 ENCOUNTER — Other Ambulatory Visit: Payer: Self-pay

## 2020-07-22 DIAGNOSIS — M539 Dorsopathy, unspecified: Secondary | ICD-10-CM | POA: Diagnosis not present

## 2020-07-22 DIAGNOSIS — R293 Abnormal posture: Secondary | ICD-10-CM | POA: Diagnosis not present

## 2020-07-22 DIAGNOSIS — R29898 Other symptoms and signs involving the musculoskeletal system: Secondary | ICD-10-CM | POA: Diagnosis not present

## 2020-07-22 DIAGNOSIS — M542 Cervicalgia: Secondary | ICD-10-CM | POA: Diagnosis not present

## 2020-07-22 NOTE — Therapy (Signed)
[facility] [place] [address] [place] [address] [place], [place], [address] Phone: [phone]   Fax:  [phone]  Physical Therapy Evaluation  Patient Details  Name: Candela Krul MRN: [id number] Date of Birth: [birth year] Referring Provider (PT): Dr Megan Salon    Encounter Date: 07/22/2020   PT End of Session - 07/22/20 1701    Visit Number 1    Number of Visits 12    Date for PT Re-Evaluation 09/02/20    PT Start Time 1603    PT Stop Time 1700    PT Time Calculation (min) 57 min    Activity Tolerance Patient tolerated treatment well           Past Medical History:  Diagnosis Date  . Anxiety   . GERD (gastroesophageal reflux disease)   . Hypertension   . IBS (irritable bowel syndrome)   . Lumbar herniated disc   . Migraine   . Pre-eclampsia    with daughter  . PVC (premature ventricular contraction)    Previous holter in Dec 2011 showed PVCs and 2 3 beat runs of SVT  . Seasonal allergies     Past Surgical History:  Procedure Laterality Date  . NO PAST SURGERIES      There were no vitals filed for this visit.    Subjective Assessment - 07/22/20 1605    Subjective Patient reports that she had episodes of Lt facial numbness/migrains with injections that resolved symtpoms ~ 5-6 yrs ago. 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 She reports that pain has improved in the past few weeks since she has not been shovelling. Currently not much pain but muscular tightness in the arms and hands as well as legs and feet. She has some numbness in the hands on an intermittent basis and some neck pain throughout the day.    Pertinent History HTN; anxiety; depression; hx of neck and back pain with ESI for each ~ 4-5 yrs ago    Patient Stated Goals be able to shovel again; participate in recreational activities    Currently in Pain? Yes     Pain Score 3     Pain Location Neck    Pain Orientation Lower    Pain Descriptors / Indicators Nagging;Aching    Pain Type Acute pain;Chronic pain    Pain Radiating Towards shoulders    Pain Onset More than a month ago    Pain Frequency Intermittent    Aggravating Factors  work; shovelling; using UE's for activities    Pain Relieving Factors rest; heat              [facility] PT Assessment - 07/22/20 0001      Assessment   Medical Diagnosis Cervical dysfunction     Referring Provider (PT) Dr Megan Salon     Onset Date/Surgical Date 06/11/20    Hand Dominance Left    Next MD Visit 08/05/20    Prior Therapy 5 yrs ago for neck       Precautions   Precautions None      Balance Screen   Has the patient fallen in the past 6 months No    Has the patient had a decrease in activity level because of a fear of falling?  No    Is the patient reluctant to leave their home because of a fear of falling?  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     Leisure household chores and activities; Aeronautical engineer and yard work; Public house manager Comments intermittent tingling in hands       Posture/Postural Control   Posture Comments significant head forward posture and alignment with increased thoracic kyphosis; decreased lumbar lordosis       AROM   Right/Left Shoulder --   tight end range end range    Cervical Flexion 56 pain Rt upper trap     Cervical Extension 27    Cervical - Right Side Bend 33    Cervical - Left Side Bend 29    Cervical - Right Rotation 63    Cervical - Left Rotation 57      Strength   Overall Strength Comments WFL's bilat UE's       Palpation   Spinal mobility hypomobility thoracic and  cervical spine with CPA and lateral glides greatest upper thoracic and lower cervical     Palpation comment significant muscular tightness bilat ant/lat/post cervical musculature; pecs; upper traps; leveator; teres; medial scapular/thoracic spine paraspinals       Special Tests   Other special tests (+) neural tension test Rt > Lt UE's                       Objective measurements completed on examination: See above findings.       [facility] Adult PT Treatment/Exercise - 07/22/20 0001      Self-Care   Self-Care --   office ergonomics      Neuro Re-ed    Neuro Re-ed Details  initiated postural correction       Neck Exercises: Seated   Shoulder Rolls Backwards;10 reps    Other Seated Exercise postural correction       Neck Exercises: Supine   Neck Retraction Limitations chin tuck incresaed cervical pain - added nodding yes x 5 tolerated well     Other Supine Exercise scap squeeze/chest lift 5 sec x 5 reps       Shoulder Exercises: Standing   Other Standing Exercises standing axial extension increased Rt UE radicular pain       Moist Heat Therapy   Number Minutes Moist Heat 10 Minutes    Moist Heat Location Cervical;Shoulder   thoracic spine Rt anterior shoulder      Electrical Stimulation   Electrical Stimulation Location bilat cervical to upper traps     Electrical Stimulation Action TENs     Electrical Stimulation Parameters to tolerance    Electrical Stimulation Goals Pain;Tone                  PT Education - 07/22/20 1649    Education Details HEP; POC; TENS DN; posture; ergonomics for desk    Person(s) Educated Patient    Methods Explanation;Demonstration;Tactile cues;Verbal cues;Handout    Comprehension Verbalized understanding;Returned demonstration;Verbal cues required;Tactile cues required               PT Long Term Goals - 07/22/20 2013      PT LONG TERM GOAL #1   Title Improve posture and alignment with patient to demonstrate  increased  upright posture with posterior shoulder girdle engaged    Time 6    Period Weeks    Status New    Target Date 09/02/20      PT LONG TERM GOAL #2   Title Increase cervical ROM by 5-7 degrees in all planes    Time 6    Period Weeks    Status New    Target Date 09/02/20      PT LONG TERM GOAL #3   Title Patient reports resolution of all radicular UE symptoms by 75-80%    Time 6    Period Weeks    Status New    Target Date 09/02/20      PT LONG TERM GOAL #4   Title Patient demonstrates improve body mechanics and functional strength allowing her to work in her yard for 30-60 minutes w/ no increase in pain    Time 6    Period Weeks    Status New    Target Date 09/02/20      PT LONG TERM GOAL #5   Title Independent in HEP    Time 6    Period Weeks    Status New    Target Date 09/02/20      PT LONG TERM GOAL #6   Title Improve FOTO to </= 32% limitation    Time 6    Period Weeks    Status New    Target Date 09/02/20                  Plan - 07/22/20 2002    Clinical Impression Statement Patient presents with ~ 2 month history of cervical dysfunction and bilat UE radiculopathy. She has a history of cervical and lumbar dysfunction over the past 5-6 years. 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 She has very pooor posture and alignment with poor spinal alignment; limited cervical and thoracic as well as bilat shoulder elevation mobility and ROM; significant muscular tightness to palpation; positive neural tension bilat UE's Rt > Lt; intermittent radicuar symptoms bilat UE's; decreased functional activity tolerance. Patient will benefit from PT to address problems identified.    Stability/Clinical Decision Making Stable/Uncomplicated    Clinical Decision Making Low    Rehab Potential Good    PT Frequency 2x / week    PT Duration 6 weeks    PT Treatment/Interventions  Patient/family education;ADLs/Self Care Home Management;Aquatic Therapy;Cryotherapy;Electrical Stimulation;Iontophoresis 4mg /ml Dexamethasone;Moist Heat;Ultrasound;Functional mobility training;Therapeutic activities;Therapeutic exercise;Balance training;Neuromuscular re-education;Manual techniques;Dry needling;Taping    PT Next Visit Plan review HEP; progress exercises as tolerated; trial of manual work vs DN; postural correction; modalities as indicated    PT Home Exercise Plan [email or web address]    Consulted and Agree with Plan of Care Patient           Patient will benefit from skilled therapeutic intervention in order to improve the following deficits and impairments:  Increased fascial restricitons, Decreased activity tolerance, Pain, Impaired UE functional use, Hypomobility, Impaired flexibility, Improper body mechanics, Decreased mobility, Decreased strength, Impaired sensation, Postural dysfunction  Visit Diagnosis: Cervical dysfunction - Plan: PT plan of care cert/re-cert  Cervicalgia - Plan: PT plan of care cert/re-cert  Abnormal posture - Plan: PT plan of care cert/re-cert  Other symptoms and signs involving the musculoskeletal system - Plan: PT plan of care cert/re-cert     Problem List Patient Active Problem List   Diagnosis Date Noted  .  Canker sore 06/29/2020  . Leg cramp 06/26/2019  . Aortic calcification (HCC) 01/02/2017  . Carpal tunnel syndrome, left 10/05/2016  . Anxiety and depression 10/05/2016  . Right arm pain 09/10/2015  . Vitamin B12 deficiency 09/08/2015  . Degenerative disc disease, cervical 07/02/2015  . Complicated migraine 05/20/2015  . Fatigue 04/22/2015  . Left lumbar radiculitis 06/11/2014  . Bilateral plantar fascia fibromatosis 04/11/2014  . Plantar wart of right foot 02/07/2014  . Palpitations 11/30/2010    Keisean Skowron Rober Minion PT, MPH  07/22/2020, 8:20 PM  [facility] [address] [place] [address]  [address] [place], [place], [address] Phone: [phone]   Fax:  [phone]  Name: Gilberta Peeters MRN: [id number] Date of Birth: 28-Jun-1971

## 2020-07-22 NOTE — Patient Instructions (Addendum)
Lying on back resting arms out to side elbows straight palms up  Hold for 1 min working towards 5 min - can bend elbows to release the pull as needed  2-3 times a day    Trigger Point Dry Needling  . What is Trigger Point Dry Needling (DN)? o DN is a physical therapy technique used to treat muscle pain and dysfunction. Specifically, DN helps deactivate muscle trigger points (muscle knots).  o A thin filiform needle is used to penetrate the skin and stimulate the underlying trigger point. The goal is for a local twitch response (LTR) to occur and for the trigger point to relax. No medication of any kind is injected during the procedure.   . What Does Trigger Point Dry Needling Feel Like?  o The procedure feels different for each individual patient. Some patients report that they do not actually feel the needle enter the skin and overall the process is not painful. Very mild bleeding may occur. However, many patients feel a deep cramping in the muscle in which the needle was inserted. This is the local twitch response.   Marland Kitchen How Will I feel after the treatment? o Soreness is normal, and the onset of soreness may not occur for a few hours. Typically this soreness does not last longer than two days.  o Bruising is uncommon, however; ice can be used to decrease any possible bruising.  o In rare cases feeling tired or nauseous after the treatment is normal. In addition, your symptoms may get worse before they get better, this period will typically not last longer than 24 hours.   . What Can I do After My Treatment? o Increase your hydration by drinking more water for the next 24 hours. o You may place ice or heat on the areas treated that have become sore, however, do not use heat on inflamed or bruised areas. Heat often brings more relief post needling. o You can continue your regular activities, but vigorous activity is not recommended initially after the treatment for 24 hours. o DN is best  combined with other physical therapy such as strengthening, stretching, and other therapies.    Access Code: [email or web address]: [email or web address]: 08/11/2021Prepared by: Desten Manor HoltExercises  Standing Backward Shoulder Rolls - 2 x daily - 7 x weekly - 1 sets - 3 reps - 30 sec hold  Supine Cervical Retraction with Towel - 2 x daily - 7 x weekly - 1 sets - 5-10 reps - 5-10 sec hold  Seated Scapular Retraction - 2 x daily - 7 x weekly - 1-2 sets - 10 reps - 10 sec hold Patient Education  Forward Head Posture  Office Posture  TENS Unit

## 2020-07-24 ENCOUNTER — Other Ambulatory Visit: Payer: Self-pay

## 2020-07-24 ENCOUNTER — Encounter: Payer: Self-pay | Admitting: Rehabilitative and Restorative Service Providers"

## 2020-07-24 ENCOUNTER — Ambulatory Visit (INDEPENDENT_AMBULATORY_CARE_PROVIDER_SITE_OTHER): Payer: No Typology Code available for payment source | Admitting: Rehabilitative and Restorative Service Providers"

## 2020-07-24 DIAGNOSIS — M542 Cervicalgia: Secondary | ICD-10-CM | POA: Diagnosis not present

## 2020-07-24 DIAGNOSIS — M539 Dorsopathy, unspecified: Secondary | ICD-10-CM

## 2020-07-24 DIAGNOSIS — R293 Abnormal posture: Secondary | ICD-10-CM | POA: Diagnosis not present

## 2020-07-24 DIAGNOSIS — R29898 Other symptoms and signs involving the musculoskeletal system: Secondary | ICD-10-CM

## 2020-07-24 NOTE — Therapy (Signed)
[facility] [place] [address] [place] [address] [place], [place], [address] Phone: [phone]   Fax:  [phone]  Physical Therapy Treatment  Patient Details  Name: Melinda Parsons MRN: [id number] Date of Birth: 1971-06-22 Referring Provider (PT): Dr Megan Salon    Encounter Date: 07/24/2020   PT End of Session - 07/24/20 1524    Visit Number 2    Number of Visits 12    Date for PT Re-Evaluation 09/02/20    PT Start Time 1523    PT Stop Time 1615    PT Time Calculation (min) 52 min    Activity Tolerance Patient tolerated treatment well           Past Medical History:  Diagnosis Date  . Anxiety   . GERD (gastroesophageal reflux disease)   . Hypertension   . IBS (irritable bowel syndrome)   . Lumbar herniated disc   . Migraine   . Pre-eclampsia    with daughter  . PVC (premature ventricular contraction)    Previous holter in Dec 2011 showed PVCs and 2 3 beat runs of SVT  . Seasonal allergies     Past Surgical History:  Procedure Laterality Date  . NO PAST SURGERIES      There were no vitals filed for this visit.   Subjective Assessment - 07/24/20 1525    Subjective Patient reports continued increased pain in neck; shoulder and into Rt arm. Sat in front of camera for video conference all day yesterday. Trying to work on exercises at home    Currently in Pain? Yes    Pain Score 3     Pain Location Neck    Pain Orientation Right    Pain Descriptors / Indicators Nagging;Aching    Pain Type Acute pain;Chronic pain    Pain Radiating Towards shoulders; UE    Pain Onset More than a month ago    Pain Frequency Intermittent                             [facility] Adult PT Treatment/Exercise - 07/24/20 0001      Neck Exercises: Supine   Neck Retraction 5 reps;5 secs    Neck Retraction Limitations nodding yes 5 reps     Other Supine Exercise scap squeeze/chest lift 5 sec x 5 reps     Other Supine Exercise deep  diaphragmatic breathing in/out through the nose exhale:inhale 2:1 ratio x 2 min       Moist Heat Therapy   Number Minutes Moist Heat 12 Minutes    Moist Heat Location Cervical;Shoulder   thoracic spine Rt anterior shoulder      Electrical Stimulation   Electrical Stimulation Location bilat cervical to upper traps     Electrical Stimulation Action IFC    Electrical Stimulation Parameters to tolerance     Electrical Stimulation Goals Pain;Tone      Manual Therapy   Manual therapy comments pt supine     Joint Mobilization Grade II CPA mobs through the cervical spine; 1st rib and clavicle Lt > Rt ; springing through the ribs tight Rt > Lt     Soft tissue mobilization STM to pt tolerance through ant/lat/post cervical musculature; upper traps; pecs; anterior chest; bilat hands/forearms/arms     Myofascial Release anterior chest     Manual Traction gentle cervical traction 10-15 sec hold x 3 reps during treatment     Neural Stretch bilat UE's 20-45 sec hold  x 2 reps each UE                   PT Education - 07/24/20 1613    Education Details Deep breathing; kineso tape    Person(s) Educated Patient    Methods Explanation;Demonstration;Tactile cues;Verbal cues;Handout    Comprehension Verbalized understanding;Returned demonstration;Verbal cues required;Tactile cues required               PT Long Term Goals - 07/22/20 2013      PT LONG TERM GOAL #1   Title Improve posture and alignment with patient to demonstrate increased upright posture with posterior shoulder girdle engaged    Time 6    Period Weeks    Status New    Target Date 09/02/20      PT LONG TERM GOAL #2   Title Increase cervical ROM by 5-7 degrees in all planes    Time 6    Period Weeks    Status New    Target Date 09/02/20      PT LONG TERM GOAL #3   Title Patient reports resolution of all radicular UE symptoms by 75-80%    Time 6    Period Weeks    Status New    Target Date 09/02/20      PT LONG  TERM GOAL #4   Title Patient demonstrates improve body mechanics and functional strength allowing her to work in her yard for 30-60 minutes w/ no increase in pain    Time 6    Period Weeks    Status New    Target Date 09/02/20      PT LONG TERM GOAL #5   Title Independent in HEP    Time 6    Period Weeks    Status New    Target Date 09/02/20      PT LONG TERM GOAL #6   Title Improve FOTO to </= 32% limitation    Time 6    Period Weeks    Status New    Target Date 09/02/20                 Plan - 07/24/20 1615    Clinical Impression Statement Patient continues with unusual presentation - radicular symptoms bilat UE's with exercise and with manual work. Patient has muscular tightness through the clavicular and 1st rib area Lt > Rt with UE tingling reported with work through this area suggesting some symptoms of thoracic outlet syndrome. Patient also noted to have pain and tightness with springing Rt > Lt ribs and tightness in the fashia and connective tissue/musculature of the Rt transverse abdominals to lats. Positive response to manual work and modalities. Encouraged continued work on posture and alignment.    Rehab Potential Good    PT Frequency 2x / week    PT Duration 6 weeks    PT Treatment/Interventions Patient/family education;ADLs/Self Care Home Management;Aquatic Therapy;Cryotherapy;Electrical Stimulation;Iontophoresis 4mg /ml Dexamethasone;Moist Heat;Ultrasound;Functional mobility training;Therapeutic activities;Therapeutic exercise;Balance training;Neuromuscular re-education;Manual techniques;Dry needling;Taping    PT Next Visit Plan review HEP; progress exercises as tolerated; trial of manual work vs DN; postural correction; modalities as indicated    PT Home Exercise Plan [email or web address]    Consulted and Agree with Plan of Care Patient           Patient will benefit from skilled therapeutic intervention in order to improve the following deficits and impairments:      Visit Diagnosis: Cervical dysfunction  Cervicalgia  Abnormal posture  Other symptoms and  signs involving the musculoskeletal system     Problem List Patient Active Problem List   Diagnosis Date Noted  . Canker sore 06/29/2020  . Leg cramp 06/26/2019  . Aortic calcification (HCC) 01/02/2017  . Carpal tunnel syndrome, left 10/05/2016  . Anxiety and depression 10/05/2016  . Right arm pain 09/10/2015  . Vitamin B12 deficiency 09/08/2015  . Degenerative disc disease, cervical 07/02/2015  . Complicated migraine 05/20/2015  . Fatigue 04/22/2015  . Left lumbar radiculitis 06/11/2014  . Bilateral plantar fascia fibromatosis 04/11/2014  . Plantar wart of right foot 02/07/2014  . Palpitations 11/30/2010    Ryman Rathgeber Rober Minion PT, MPH  07/24/2020, 4:36 PM  [facility] [address] [place] [address] [place], [place], [address] Phone: [phone]   Fax:  [phone]  Name: Melinda Parsons MRN: [id number] Date of Birth: 11-Apr-1971

## 2020-07-24 NOTE — Patient Instructions (Signed)
Kinesiology tape What is kinesiology tape?  There are many brands of kinesiology tape.  KTape, [employer], [employer], Dynamic tape, to name a few. It is an elasticized tape designed to support the body's natural healing process. This tape provides stability and support to muscles and joints without restricting motion. It can also help decrease swelling in the area of application. How does it work? The tape microscopically lifts and decompresses the skin to allow for drainage of lymph (swelling) to flow away from area, reducing inflammation.  The tape has the ability to help re-educate the neuromuscular system by targeting specific receptors in the skin.  The presence of the tape increases the body's awareness of posture and body mechanics.  Do not use with: . Open wounds . Skin lesions . Adhesive allergies Safe removal of the tape: In some rare cases, mild/moderate skin irritation can occur.  This can include redness, itchiness, or hives. If this occurs, immediately remove tape and consult your primary care physician if symptoms are severe or do not resolve within 2 days.  To remove tape safely, hold nearby skin with one hand and gentle roll tape down with other hand.  You can apply oil or conditioner to tape while in shower prior to removal to loosen adhesive.  DO NOT swiftly rip tape off like a band-aid, as this could cause skin tears and additional skin irritation.     Deep breathing in through the nose; pause; out through nose focus on exhale  Exhale 2:1 to inhale  10-12 reps 3-4 times/day

## 2020-07-28 ENCOUNTER — Ambulatory Visit (INDEPENDENT_AMBULATORY_CARE_PROVIDER_SITE_OTHER): Payer: No Typology Code available for payment source | Admitting: Rehabilitative and Restorative Service Providers"

## 2020-07-28 ENCOUNTER — Encounter: Payer: Self-pay | Admitting: Rehabilitative and Restorative Service Providers"

## 2020-07-28 ENCOUNTER — Other Ambulatory Visit: Payer: Self-pay

## 2020-07-28 DIAGNOSIS — R293 Abnormal posture: Secondary | ICD-10-CM

## 2020-07-28 DIAGNOSIS — M539 Dorsopathy, unspecified: Secondary | ICD-10-CM

## 2020-07-28 DIAGNOSIS — M542 Cervicalgia: Secondary | ICD-10-CM | POA: Diagnosis not present

## 2020-07-28 DIAGNOSIS — R29898 Other symptoms and signs involving the musculoskeletal system: Secondary | ICD-10-CM | POA: Diagnosis not present

## 2020-07-28 NOTE — Patient Instructions (Signed)

## 2020-07-28 NOTE — Therapy (Signed)
[facility] [place] [address] [place], [place], [address] Phone: [phone]   Fax:  [phone]  Physical Therapy Treatment  Patient Details  Name: Melinda Parsons MRN: [id number] Date of Birth: 05-30-1971 Referring Provider (PT): Dr Megan Salon    Encounter Date: 07/28/2020   PT End of Session - 07/28/20 0801    Visit Number 3    Number of Visits 12    Date for PT Re-Evaluation 09/02/20    PT Start Time 0800    PT Stop Time 0849    PT Time Calculation (min) 49 min    Activity Tolerance Patient tolerated treatment well           Past Medical History:  Diagnosis Date  . Anxiety   . GERD (gastroesophageal reflux disease)   . Hypertension   . IBS (irritable bowel syndrome)   . Lumbar herniated disc   . Migraine   . Pre-eclampsia    with daughter  . PVC (premature ventricular contraction)    Previous holter in Dec 2011 showed PVCs and 2 3 beat runs of SVT  . Seasonal allergies     Past Surgical History:  Procedure Laterality Date  . NO PAST SURGERIES      There were no vitals filed for this visit.   Subjective Assessment - 07/28/20 0802    Subjective Patient reports that she has been working on exercises and posture. Neck exercises are the hardest    Currently in Pain? Yes    Pain Score 4     Pain Location Neck    Pain Orientation Right    Pain Descriptors / Indicators Aching;Nagging                             [facility] Adult PT Treatment/Exercise - 07/28/20 0001      Neck Exercises: Supine   Neck Retraction 5 reps;5 secs    Neck Retraction Limitations nodding yes 5 reps     Other Supine Exercise scap squeeze/chest lift 5 sec x 5 reps     Other Supine Exercise deep diaphragmatic breathing in/out through the nose exhale:inhale 2:1 ratio x 2 min part of time with hands clasp behind head       Shoulder Exercises: Standing   Other Standing Exercises standing axial extension less increased  Rt UE radicular pain; tolerated scap squeeze and L's W's well - scap squeeze 5 sec x 5; L's x 10; W's x 10 with noodle       Moist Heat Therapy   Number Minutes Moist Heat 12 Minutes    Moist Heat Location Cervical;Shoulder   thoracic spine Rt anterior shoulder      Electrical Stimulation   Electrical Stimulation Location bilat cervical to upper traps     Electrical Stimulation Action IFC    Electrical Stimulation Parameters to tolerance    Electrical Stimulation Goals Pain;Tone      Manual Therapy   Manual therapy comments skilled palpation to assess tissue response to DN and manual work     Joint Mobilization Grade II CPA mobs through the cervical spine; 1st rib and clavicle Lt > Rt ; springing through the ribs tight Rt > Lt     Soft tissue mobilization STM to pt tolerance through ant/lat/post cervical musculature; upper traps; pecs; anterior chest; bilat hands/forearms/arms     Myofascial Release anterior chest     Manual Traction gentle cervical traction 10-15 sec hold x  3 reps during treatment             Trigger Point Dry Needling - 07/28/20 0001    Consent Given? Yes    Education Handout Provided Yes    Muscles Treated Head and Neck Cervical multifidi;Suboccipitals;Upper trapezius    Other Dry Needling bilat - 3 needles each side    Upper Trapezius Response Palpable increased muscle length    Suboccipitals Response Palpable increased muscle length    Cervical multifidi Response Palpable increased muscle length                PT Education - 07/28/20 0812    Education Details DN HEP    Person(s) Educated Patient    Methods Explanation;Demonstration;Tactile cues;Verbal cues;Handout    Comprehension Verbalized understanding;Returned demonstration;Verbal cues required;Tactile cues required               PT Long Term Goals - 07/22/20 2013      PT LONG TERM GOAL #1   Title Improve posture and alignment with patient to demonstrate increased upright posture with  posterior shoulder girdle engaged    Time 6    Period Weeks    Status New    Target Date 09/02/20      PT LONG TERM GOAL #2   Title Increase cervical ROM by 5-7 degrees in all planes    Time 6    Period Weeks    Status New    Target Date 09/02/20      PT LONG TERM GOAL #3   Title Patient reports resolution of all radicular UE symptoms by 75-80%    Time 6    Period Weeks    Status New    Target Date 09/02/20      PT LONG TERM GOAL #4   Title Patient demonstrates improve body mechanics and functional strength allowing her to work in her yard for 30-60 minutes w/ no increase in pain    Time 6    Period Weeks    Status New    Target Date 09/02/20      PT LONG TERM GOAL #5   Title Independent in HEP    Time 6    Period Weeks    Status New    Target Date 09/02/20      PT LONG TERM GOAL #6   Title Improve FOTO to </= 32% limitation    Time 6    Period Weeks    Status New    Target Date 09/02/20                 Plan - 07/28/20 0807    Clinical Impression Statement Patient reports that she tightness and pain in the neck continue. Muscular tightness continues through the clavicular and 1st rib area Lt > Rt with UE tingling - questionable thoracic outlet type symptoms. Tightness into the ribs with springing Rt > Lt. Trial of DN posterior cervical/occipital area and Rt lateral side    Rehab Potential Good    PT Frequency 2x / week    PT Duration 6 weeks    PT Treatment/Interventions Patient/family education;ADLs/Self Care Home Management;Aquatic Therapy;Cryotherapy;Electrical Stimulation;Iontophoresis 4mg /ml Dexamethasone;Moist Heat;Ultrasound;Functional mobility training;Therapeutic activities;Therapeutic exercise;Balance training;Neuromuscular re-education;Manual techniques;Dry needling;Taping    PT Next Visit Plan review HEP; progress exercises as tolerated; trial of manual work vs DN; postural correction; modalities as indicated    PT Home Exercise Plan [email or web address]     Consulted and Agree with Plan of Care Patient  Patient will benefit from skilled therapeutic intervention in order to improve the following deficits and impairments:     Visit Diagnosis: Cervical dysfunction  Cervicalgia  Abnormal posture  Other symptoms and signs involving the musculoskeletal system     Problem List Patient Active Problem List   Diagnosis Date Noted  . Canker sore 06/29/2020  . Leg cramp 06/26/2019  . Aortic calcification (HCC) 01/02/2017  . Carpal tunnel syndrome, left 10/05/2016  . Anxiety and depression 10/05/2016  . Right arm pain 09/10/2015  . Vitamin B12 deficiency 09/08/2015  . Degenerative disc disease, cervical 07/02/2015  . Complicated migraine 05/20/2015  . Fatigue 04/22/2015  . Left lumbar radiculitis 06/11/2014  . Bilateral plantar fascia fibromatosis 04/11/2014  . Plantar wart of right foot 02/07/2014  . Palpitations 11/30/2010    Melinda Parsons PT, MPH  07/28/2020, 8:54 AM  [facility] [address] [place], [place], [address] Phone: [phone]   Fax:  [phone]  Name: Melinda Parsons MRN: [id number] Date of Birth: 15-May-1971

## 2020-07-31 ENCOUNTER — Other Ambulatory Visit: Payer: Self-pay

## 2020-07-31 ENCOUNTER — Ambulatory Visit (INDEPENDENT_AMBULATORY_CARE_PROVIDER_SITE_OTHER): Payer: No Typology Code available for payment source | Admitting: Rehabilitative and Restorative Service Providers"

## 2020-07-31 DIAGNOSIS — R29898 Other symptoms and signs involving the musculoskeletal system: Secondary | ICD-10-CM

## 2020-07-31 DIAGNOSIS — M542 Cervicalgia: Secondary | ICD-10-CM | POA: Diagnosis not present

## 2020-07-31 DIAGNOSIS — R293 Abnormal posture: Secondary | ICD-10-CM

## 2020-07-31 DIAGNOSIS — M539 Dorsopathy, unspecified: Secondary | ICD-10-CM | POA: Diagnosis not present

## 2020-07-31 NOTE — Therapy (Signed)
[facility] [place] [address] [place], [place], [address] Phone: [phone]   Fax:  [phone]  Physical Therapy Treatment  Patient Details  Name: Melinda Parsons MRN: [id number] Date of Birth: 07/26/1971 Referring Provider (PT): Dr Megan Salon    Encounter Date: 07/31/2020   PT End of Session - 07/31/20 0805    Visit Number 4    Number of Visits 12    Date for PT Re-Evaluation 09/02/20    PT Start Time 0801    PT Stop Time 0845    PT Time Calculation (min) 44 min    Activity Tolerance Patient tolerated treatment well           Past Medical History:  Diagnosis Date  . Anxiety   . GERD (gastroesophageal reflux disease)   . Hypertension   . IBS (irritable bowel syndrome)   . Lumbar herniated disc   . Migraine   . Pre-eclampsia    with daughter  . PVC (premature ventricular contraction)    Previous holter in Dec 2011 showed PVCs and 2 3 beat runs of SVT  . Seasonal allergies     Past Surgical History:  Procedure Laterality Date  . NO PAST SURGERIES      There were no vitals filed for this visit.   Subjective Assessment - 07/31/20 0806    Subjective Patient reports that the DN helped a lot. She was having more stress and increased pian since yesterday.    Currently in Pain? Yes    Pain Location Neck    Pain Orientation Right    Pain Descriptors / Indicators Aching;Nagging    Pain Type Acute pain;Chronic pain                             [facility] Adult PT Treatment/Exercise - 07/31/20 0001      Neck Exercises: Supine   Neck Retraction 5 reps;5 secs    Neck Retraction Limitations nodding yes 5 reps     Other Supine Exercise scap squeeze/chest lift 5 sec x 5 reps     Other Supine Exercise deep diaphragmatic breathing in/out through the nose exhale:inhale 2:1 ratio x 2 min part of time with hands clasp behind head       Shoulder Exercises: Standing   Other Standing Exercises standing axial  extension less increased Rt UE radicular pain; tolerated scap squeeze and L's W's well - scap squeeze 5 sec x 5; L's x 10; W's x 10 with noodle       Manual Therapy   Manual therapy comments skilled palpation to assess tissue response to DN and manual work     Joint Mobilization Grade II CPA mobs through the cervical spine; 1st rib and clavicle Lt > Rt ; springing through the ribs tight Rt > Lt     Soft tissue mobilization STM to pt tolerance through ant/lat/post cervical musculature; upper traps; pecs; anterior chest; bilat hands/forearms/arms     Myofascial Release anterior chest     Manual Traction gentle cervical traction 10-15 sec hold x 3 reps during treatment             Trigger Point Dry Needling - 07/31/20 0001    Consent Given? Yes    Education Handout Provided Previously provided    Other Dry Needling bilat     Upper Trapezius Response Palpable increased muscle length    Suboccipitals Response Palpable increased muscle length  Cervical multifidi Response Palpable increased muscle length                PT Education - 07/31/20 0844    Education Details Kinesotape    Person(s) Educated Patient    Methods Explanation;Handout    Comprehension Verbalized understanding               PT Long Term Goals - 07/22/20 2013      PT LONG TERM GOAL #1   Title Improve posture and alignment with patient to demonstrate increased upright posture with posterior shoulder girdle engaged    Time 6    Period Weeks    Status New    Target Date 09/02/20      PT LONG TERM GOAL #2   Title Increase cervical ROM by 5-7 degrees in all planes    Time 6    Period Weeks    Status New    Target Date 09/02/20      PT LONG TERM GOAL #3   Title Patient reports resolution of all radicular UE symptoms by 75-80%    Time 6    Period Weeks    Status New    Target Date 09/02/20      PT LONG TERM GOAL #4   Title Patient demonstrates improve body mechanics and functional strength  allowing her to work in her yard for 30-60 minutes w/ no increase in pain    Time 6    Period Weeks    Status New    Target Date 09/02/20      PT LONG TERM GOAL #5   Title Independent in HEP    Time 6    Period Weeks    Status New    Target Date 09/02/20      PT LONG TERM GOAL #6   Title Improve FOTO to </= 32% limitation    Time 6    Period Weeks    Status New    Target Date 09/02/20                 Plan - 07/31/20 0845    Clinical Impression Statement Positive response to DN and manual work. Some flare up of Rt UE pain possibly related to work stress. Continued with DN and added trial of kinesotaping. COntinue to work on TOS type symtpoms Rt > Lt - clavicle, 1st rib to lower ribs Rt > Lt    Rehab Potential Good    PT Frequency 2x / week    PT Duration 6 weeks    PT Treatment/Interventions Patient/family education;ADLs/Self Care Home Management;Aquatic Therapy;Cryotherapy;Electrical Stimulation;Iontophoresis 4mg /ml Dexamethasone;Moist Heat;Ultrasound;Functional mobility training;Therapeutic activities;Therapeutic exercise;Balance training;Neuromuscular re-education;Manual techniques;Dry needling;Taping    PT Next Visit Plan review HEP; progress exercises as tolerated; assess response to taping and manual work vs DN; postural correction; modalities as indicated    PT Home Exercise Plan [email or web address]    Consulted and Agree with Plan of Care Patient           Patient will benefit from skilled therapeutic intervention in order to improve the following deficits and impairments:     Visit Diagnosis: Cervical dysfunction  Cervicalgia  Abnormal posture  Other symptoms and signs involving the musculoskeletal system     Problem List Patient Active Problem List   Diagnosis Date Noted  . Canker sore 06/29/2020  . Leg cramp 06/26/2019  . Aortic calcification (HCC) 01/02/2017  . Carpal tunnel syndrome, left 10/05/2016  . Anxiety and depression 10/05/2016  .  Right arm  pain 09/10/2015  . Vitamin B12 deficiency 09/08/2015  . Degenerative disc disease, cervical 07/02/2015  . Complicated migraine 05/20/2015  . Fatigue 04/22/2015  . Left lumbar radiculitis 06/11/2014  . Bilateral plantar fascia fibromatosis 04/11/2014  . Plantar wart of right foot 02/07/2014  . Palpitations 11/30/2010    Ailyne Pawley Rober Minion PT, MPH  07/31/2020, 8:50 AM  [facility] [address] [place], [place], [address] Phone: [phone]   Fax:  [phone]  Name: Tressie Ragin MRN: [id number] Date of Birth: September 28, 1971

## 2020-07-31 NOTE — Patient Instructions (Signed)

## 2020-08-03 ENCOUNTER — Encounter: Payer: Self-pay | Admitting: Rehabilitative and Restorative Service Providers"

## 2020-08-03 ENCOUNTER — Ambulatory Visit (INDEPENDENT_AMBULATORY_CARE_PROVIDER_SITE_OTHER): Payer: No Typology Code available for payment source | Admitting: Rehabilitative and Restorative Service Providers"

## 2020-08-03 ENCOUNTER — Other Ambulatory Visit: Payer: Self-pay

## 2020-08-03 DIAGNOSIS — R29898 Other symptoms and signs involving the musculoskeletal system: Secondary | ICD-10-CM

## 2020-08-03 DIAGNOSIS — M542 Cervicalgia: Secondary | ICD-10-CM | POA: Diagnosis not present

## 2020-08-03 DIAGNOSIS — R293 Abnormal posture: Secondary | ICD-10-CM

## 2020-08-03 DIAGNOSIS — M539 Dorsopathy, unspecified: Secondary | ICD-10-CM | POA: Diagnosis not present

## 2020-08-03 NOTE — Therapy (Signed)
[facility] [place] [address] [place], [place], [address] Phone: [phone]   Fax:  [phone]  Physical Therapy Treatment  Patient Details  Name: Melinda Parsons MRN: [id number] Date of Birth: 02/13/1971 Referring Provider (PT): Dr Megan Salon    Encounter Date: 08/03/2020   PT End of Session - 08/03/20 0800    Visit Number 5    Number of Visits 12    Date for PT Re-Evaluation 09/02/20    PT Start Time 0759    PT Stop Time 0847    PT Time Calculation (min) 48 min    Activity Tolerance Patient tolerated treatment well           Past Medical History:  Diagnosis Date  . Anxiety   . GERD (gastroesophageal reflux disease)   . Hypertension   . IBS (irritable bowel syndrome)   . Lumbar herniated disc   . Migraine   . Pre-eclampsia    with daughter  . PVC (premature ventricular contraction)    Previous holter in Dec 2011 showed PVCs and 2 3 beat runs of SVT  . Seasonal allergies     Past Surgical History:  Procedure Laterality Date  . NO PAST SURGERIES      There were no vitals filed for this visit.   Subjective Assessment - 08/03/20 0800    Subjective Feels looser with the DN. Tape irritated the skin. Had a rough weekend - washing and folding clothes seems to irritate. Zingers throughout the weekend. Rested and they resolved.    Currently in Pain? Yes    Pain Score 2     Pain Location Neck    Pain Orientation Right    Pain Descriptors / Indicators Tightness    Pain Type Acute pain;Chronic pain    Pain Radiating Towards zingers into both arms at times    Pain Onset More than a month ago    Pain Frequency Intermittent              [facility] PT Assessment - 08/03/20 0001      Assessment   Medical Diagnosis Cervical dysfunction     Referring Provider (PT) Dr Megan Salon     Onset Date/Surgical Date 06/11/20    Hand Dominance Left    Next MD Visit 08/05/20    Prior Therapy 5 yrs ago for neck       AROM     Cervical Flexion 53pain in the Rt upper trap     Cervical Extension 44    Cervical - Right Side Bend 33    Cervical - Left Side Bend 31    Cervical - Right Rotation 70    Cervical - Left Rotation 68      Palpation   Spinal mobility hypomobility thoracic and cervical spine with CPA and lateral glides greatest upper thoracic and lower cervical     Palpation comment significant muscular tightness bilat ant/lat/post cervical musculature; pecs; upper traps; leveator; teres; medial scapular/thoracic spine paraspinals                          [facility] Adult PT Treatment/Exercise - 08/03/20 0001      Neck Exercises: Supine   Neck Retraction 5 reps;5 secs    Neck Retraction Limitations nodding yes 5 reps     Other Supine Exercise scap squeeze/chest lift 5 sec x 5 reps     Other Supine Exercise deep diaphragmatic breathing in/out through the nose exhale:inhale 2:1  ratio x 2 min part of time with hands clasp behind head       Shoulder Exercises: Prone   Retraction Strengthening;Both;5 reps   with axial extension   Retraction Limitations some discomfort       Shoulder Exercises: Standing   Row Strengthening;Both;20 reps;Theraband    Theraband Level (Shoulder Row) Level 2 (Red)    Retraction Strengthening;Both;15 reps;Theraband    Theraband Level (Shoulder Retraction) Level 1 (Yellow)    Other Standing Exercises standing axial extension less increased Rt UE radicular pain; tolerated scap squeeze and L's W's well - scap squeeze 5 sec x 5; L's x 10; W's x 10 with noodle       Moist Heat Therapy   Number Minutes Moist Heat 10 Minutes    Moist Heat Location Cervical;Shoulder      Electrical Stimulation   Electrical Stimulation Location Rt cervical to upper traps     Electrical Stimulation Action IFC    Electrical Stimulation Parameters to tolerance     Electrical Stimulation Goals Pain;Tone      Manual Therapy   Manual therapy comments skilled palpation to assess tissue  response to DN and manual work     Joint Mobilization Grade II CPA mobs through the cervical spine; 1st rib and clavicle Lt > Rt ; springing through the ribs tight Rt > Lt     Soft tissue mobilization STM to pt tolerance through ant/lat/post cervical musculature; upper traps; pecs; anterior chest; bilat hands/forearms/arms     Myofascial Release anterior chest     Manual Traction gentle cervical traction 10-15 sec hold x 3 reps during treatment             Trigger Point Dry Needling - 08/03/20 0001    Consent Given? Yes    Education Handout Provided Previously provided    Other Dry Needling bilat     Upper Trapezius Response Palpable increased muscle length    Suboccipitals Response Palpable increased muscle length    Scalenes Response Palpable increased muscle length   Rt only    Cervical multifidi Response Palpable increased muscle length                PT Education - 08/03/20 0854    Education Details HEP    Person(s) Educated Patient    Methods Explanation;Demonstration;Tactile cues;Verbal cues;Handout    Comprehension Verbalized understanding;Returned demonstration;Verbal cues required;Tactile cues required               PT Long Term Goals - 07/22/20 2013      PT LONG TERM GOAL #1   Title Improve posture and alignment with patient to demonstrate increased upright posture with posterior shoulder girdle engaged    Time 6    Period Weeks    Status New    Target Date 09/02/20      PT LONG TERM GOAL #2   Title Increase cervical ROM by 5-7 degrees in all planes    Time 6    Period Weeks    Status New    Target Date 09/02/20      PT LONG TERM GOAL #3   Title Patient reports resolution of all radicular UE symptoms by 75-80%    Time 6    Period Weeks    Status New    Target Date 09/02/20      PT LONG TERM GOAL #4   Title Patient demonstrates improve body mechanics and functional strength allowing her to work in her yard for  30-60 minutes w/ no increase in  pain    Time 6    Period Weeks    Status New    Target Date 09/02/20      PT LONG TERM GOAL #5   Title Independent in HEP    Time 6    Period Weeks    Status New    Target Date 09/02/20      PT LONG TERM GOAL #6   Title Improve FOTO to </= 32% limitation    Time 6    Period Weeks    Status New    Target Date 09/02/20                 Plan - 08/03/20 0845    Clinical Impression Statement Some improvement since beginning therapy with decresaed symptoms and increased cervical ROM. Persistent radicular symptoms and pain Rt > Lt UE. Muscular tightness through the cervical and shoulder girdle areas. Added strengthening with continued cues for technique.    Rehab Potential Good    PT Frequency 2x / week    PT Duration 6 weeks    PT Treatment/Interventions Patient/family education;ADLs/Self Care Home Management;Aquatic Therapy;Cryotherapy;Electrical Stimulation;Iontophoresis 4mg /ml Dexamethasone;Moist Heat;Ultrasound;Functional mobility training;Therapeutic activities;Therapeutic exercise;Balance training;Neuromuscular re-education;Manual techniques;Dry needling;Taping    PT Next Visit Plan review HEP; progress exercises as tolerated; assess response to taping and manual work vs DN; postural correction; modalities as indicated    PT Home Exercise Plan [email or web address]    Consulted and Agree with Plan of Care Patient           Patient will benefit from skilled therapeutic intervention in order to improve the following deficits and impairments:     Visit Diagnosis: Cervical dysfunction  Cervicalgia  Abnormal posture  Other symptoms and signs involving the musculoskeletal system     Problem List Patient Active Problem List   Diagnosis Date Noted  . Canker sore 06/29/2020  . Leg cramp 06/26/2019  . Aortic calcification (HCC) 01/02/2017  . Carpal tunnel syndrome, left 10/05/2016  . Anxiety and depression 10/05/2016  . Right arm pain 09/10/2015  . Vitamin B12 deficiency  09/08/2015  . Degenerative disc disease, cervical 07/02/2015  . Complicated migraine 05/20/2015  . Fatigue 04/22/2015  . Left lumbar radiculitis 06/11/2014  . Bilateral plantar fascia fibromatosis 04/11/2014  . Plantar wart of right foot 02/07/2014  . Palpitations 11/30/2010    Moataz Tavis 12/02/2010 PT, MPH  08/03/2020, 8:56 AM  [facility] [address] [place], [place], [place] Phone: [phone]   Fax:  [phone]  Name: Melinda Parsons MRN: [name] Date of Birth: [birth year]

## 2020-08-03 NOTE — Patient Instructions (Signed)
Access Code: [email or web address]: [email or web address]: 08/23/2021Prepared by: Rojelio Uhrich HoltExercises  Standing Backward Shoulder Rolls - 2 x daily - 7 x weekly - 1 sets - 3 reps - 30 sec hold  Supine Cervical Retraction with Towel - 2 x daily - 7 x weekly - 1 sets - 5-10 reps - 5-10 sec hold  Seated Scapular Retraction - 2 x daily - 7 x weekly - 1-2 sets - 10 reps - 10 sec hold  Prone Scapular Retraction - 2 x daily - 7 x weekly - 1 sets - 5-10 reps - 3-5 sec hold  Standing Bilateral Low Shoulder Row with Anchored Resistance - 2 x daily - 7 x weekly - 1-3 sets - 10 reps - 2-3 sec hold  Standing Shoulder External Rotation with Resistance - 2 x daily - 7 x weekly - 1-3 sets - 10 reps - 2-3 sec hold

## 2020-08-05 ENCOUNTER — Ambulatory Visit (INDEPENDENT_AMBULATORY_CARE_PROVIDER_SITE_OTHER): Payer: No Typology Code available for payment source | Admitting: Sports Medicine

## 2020-08-05 DIAGNOSIS — G43109 Migraine with aura, not intractable, without status migrainosus: Secondary | ICD-10-CM

## 2020-08-05 MED ORDER — TROKENDI XR 50 MG PO CP24: 1.0000 | ORAL_CAPSULE | Freq: Every day | ORAL | 3 refills | Status: DC

## 2020-08-05 NOTE — Assessment & Plan Note (Addendum)
Melinda Parsons returns, she has been having right-sided hemifacial numbness, suspected to be a cephalgia migraines, she did have a brain MRI about 5 years ago that was for the most part unremarkable with the exception of expected nonspecific white matter changes. We added Trokendi, and she has noted good improvement in symptoms but still has occasional hemifacial numbness. Ie, improved but not at goal. Increasing Trokendi to 50 mg daily, we can continue the up taper as needed.

## 2020-08-05 NOTE — Progress Notes (Signed)
° ° °  Procedures performed today:    None.  Independent interpretation of notes and tests performed by another provider:   None.  Brief History, Exam, Impression, and Recommendations:    Complicated migraine Melinda Parsons returns, she has been having right-sided hemifacial numbness, suspected to be a cephalgia migraines, she did have a brain MRI about 5 years ago that was for the most part unremarkable with the exception of expected nonspecific white matter changes. We added Trokendi, and she has noted good improvement in symptoms but still has occasional hemifacial numbness. Ie, improved but not at goal. Increasing Trokendi to 50 mg daily, we can continue the up taper as needed.     ___________________________________________ Ihor Austin. Benjamin Stain, M.D., ABFM., CAQSM. Primary Care and Sports Medicine  [facility]  Adjunct Instructor of Family Medicine  [employer]

## 2020-08-06 ENCOUNTER — Ambulatory Visit (INDEPENDENT_AMBULATORY_CARE_PROVIDER_SITE_OTHER): Payer: No Typology Code available for payment source | Admitting: Rehabilitative and Restorative Service Providers"

## 2020-08-06 ENCOUNTER — Other Ambulatory Visit: Payer: Self-pay

## 2020-08-06 ENCOUNTER — Encounter: Payer: Self-pay | Admitting: Rehabilitative and Restorative Service Providers"

## 2020-08-06 DIAGNOSIS — R293 Abnormal posture: Secondary | ICD-10-CM | POA: Diagnosis not present

## 2020-08-06 DIAGNOSIS — R29898 Other symptoms and signs involving the musculoskeletal system: Secondary | ICD-10-CM

## 2020-08-06 DIAGNOSIS — M542 Cervicalgia: Secondary | ICD-10-CM | POA: Diagnosis not present

## 2020-08-06 DIAGNOSIS — M539 Dorsopathy, unspecified: Secondary | ICD-10-CM

## 2020-08-06 NOTE — Therapy (Signed)
[facility] [place] [address] [place] [address] [place], [place], [address] Phone: [phone]   Fax:  [phone]  Physical Therapy Treatment  Patient Details  Name: Melinda Parsons MRN: [id number] Date of Birth: Dec 17, 1970 Referring Provider (PT): Dr Megan Salon    Encounter Date: 08/06/2020   PT End of Session - 08/06/20 1622    Visit Number 6    Number of Visits 12    Date for PT Re-Evaluation 09/02/20    PT Start Time 1619    PT Stop Time 1708    PT Time Calculation (min) 49 min    Activity Tolerance Patient tolerated treatment well           Past Medical History:  Diagnosis Date  . Anxiety   . GERD (gastroesophageal reflux disease)   . Hypertension   . IBS (irritable bowel syndrome)   . Lumbar herniated disc   . Migraine   . Pre-eclampsia    with daughter  . PVC (premature ventricular contraction)    Previous holter in Dec 2011 showed PVCs and 2 3 beat runs of SVT  . Seasonal allergies     Past Surgical History:  Procedure Laterality Date  . NO PAST SURGERIES      There were no vitals filed for this visit.   Subjective Assessment - 08/06/20 1623    Subjective Realizing that she needs to make some ergonomic changes with positions at work and when she is relaxing. She is working on posture and alignment. Scheduled for cervical ESI 08/20/20    Currently in Pain? Yes    Pain Score 2     Pain Location Neck    Pain Orientation Right    Pain Descriptors / Indicators Tightness    Pain Type Acute pain;Chronic pain                             [facility] Adult PT Treatment/Exercise - 08/06/20 0001      Neuro Re-ed    Neuro Re-ed Details  working on scapular postural control in standing and supine with focus on position of shoulder for functional activities and movements on UE's minimzing radicular symptoms       Neck Exercises: Supine   Neck Retraction 5 reps;5 secs    Neck Retraction Limitations nodding yes 5  reps     Shoulder Flexion Limitations scapular squeeze with press up to ceiling focus on keeping shoulder blades down and back x 5 reps x 3 sets     Shoulder ABduction Both;10 reps   horizontal abduction yellow TB  - some "zingers in the Rt UE   Upper Extremity D1 Flexion;Extension;5 reps;Theraband;Limitations    Theraband Level (UE D1) Level 1 (Yellow)    UE D1 Limitations difficulty with maintaining scapular control with all exercises     Other Supine Exercise scap squeeze/chest lift 5 sec x 5 reps     Other Supine Exercise deep diaphragmatic breathing in/out through the nose exhale:inhale 2:1 ratio x 2 min part of time with hands clasp behind head       Shoulder Exercises: Standing   Row Strengthening;Both;20 reps;Theraband    Theraband Level (Shoulder Row) Level 2 (Red)    Retraction Strengthening;Both;15 reps;Theraband    Theraband Level (Shoulder Retraction) Level 1 (Yellow)    Other Standing Exercises standing axial extension less increased Rt UE radicular pain; tolerated scap squeeze and L's W's well - scap squeeze 5 sec x 5;  L's x 10; W's x 10 with noodle       Moist Heat Therapy   Number Minutes Moist Heat 10 Minutes    Moist Heat Location Cervical;Shoulder                  PT Education - 08/06/20 1651    Education Details HEP    Person(s) Educated Patient    Methods Explanation;Demonstration;Tactile cues;Verbal cues;Handout    Comprehension Verbalized understanding;Returned demonstration;Verbal cues required;Tactile cues required               PT Long Term Goals - 07/22/20 2013      PT LONG TERM GOAL #1   Title Improve posture and alignment with patient to demonstrate increased upright posture with posterior shoulder girdle engaged    Time 6    Period Weeks    Status New    Target Date 09/02/20      PT LONG TERM GOAL #2   Title Increase cervical ROM by 5-7 degrees in all planes    Time 6    Period Weeks    Status New    Target Date 09/02/20       PT LONG TERM GOAL #3   Title Patient reports resolution of all radicular UE symptoms by 75-80%    Time 6    Period Weeks    Status New    Target Date 09/02/20      PT LONG TERM GOAL #4   Title Patient demonstrates improve body mechanics and functional strength allowing her to work in her yard for 30-60 minutes w/ no increase in pain    Time 6    Period Weeks    Status New    Target Date 09/02/20      PT LONG TERM GOAL #5   Title Independent in HEP    Time 6    Period Weeks    Status New    Target Date 09/02/20      PT LONG TERM GOAL #6   Title Improve FOTO to </= 32% limitation    Time 6    Period Weeks    Status New    Target Date 09/02/20                 Plan - 08/06/20 1808    Clinical Impression Statement Gradual improvement in cervical radiculopathy with improvement in posture and alignment. Patient now recognizing activities she is doing at home and work from home that irritate symptoms. She has decresed radicular Rt LUE symptoms with decrease in frequency, intensity and duration. She can now sometimes change positions to elivate radicular symptoms.    Rehab Potential Good    PT Frequency 2x / week    PT Duration 6 weeks    PT Treatment/Interventions Patient/family education;ADLs/Self Care Home Management;Aquatic Therapy;Cryotherapy;Electrical Stimulation;Iontophoresis 4mg /ml Dexamethasone;Moist Heat;Ultrasound;Functional mobility training;Therapeutic activities;Therapeutic exercise;Balance training;Neuromuscular re-education;Manual techniques;Dry needling;Taping    PT Next Visit Plan review HEP; progress exercises as tolerated; assess response to taping and manual work vs DN; postural correction; modalities as indicated continue neuromuscular re-education    PT Home Exercise Plan [email or web address]    Consulted and Agree with Plan of Care Patient           Patient will benefit from skilled therapeutic intervention in order to improve the following deficits and  impairments:     Visit Diagnosis: Cervical dysfunction  Cervicalgia  Abnormal posture  Other symptoms and signs involving the musculoskeletal system  Problem List Patient Active Problem List   Diagnosis Date Noted  . Canker sore 06/29/2020  . Leg cramp 06/26/2019  . Aortic calcification (HCC) 01/02/2017  . Carpal tunnel syndrome, left 10/05/2016  . Anxiety and depression 10/05/2016  . Right arm pain 09/10/2015  . Vitamin B12 deficiency 09/08/2015  . Degenerative disc disease, cervical 07/02/2015  . Complicated migraine 05/20/2015  . Fatigue 04/22/2015  . Left lumbar radiculitis 06/11/2014  . Bilateral plantar fascia fibromatosis 04/11/2014  . Plantar wart of right foot 02/07/2014  . Palpitations 11/30/2010    Malavika Lira Rober Minion PT, MPH  08/06/2020, 6:11 PM  [facility] [address] [place] [address] [place], [place], [address] Phone: [phone]   Fax:  [phone]  Name: Fabiha Rougeau MRN: [id number] Date of Birth: [birth year]

## 2020-08-06 NOTE — Patient Instructions (Signed)
Lying on back  Squeeze shoulders blades down and back and HOLD  Pretend to lift boulder slowly toward ceiling   Keep hips and knees bent Tighten lumbar core

## 2020-08-10 ENCOUNTER — Ambulatory Visit (INDEPENDENT_AMBULATORY_CARE_PROVIDER_SITE_OTHER): Payer: No Typology Code available for payment source | Admitting: Rehabilitative and Restorative Service Providers"

## 2020-08-10 ENCOUNTER — Other Ambulatory Visit: Payer: Self-pay

## 2020-08-10 ENCOUNTER — Encounter: Payer: Self-pay | Admitting: Rehabilitative and Restorative Service Providers"

## 2020-08-10 DIAGNOSIS — M539 Dorsopathy, unspecified: Secondary | ICD-10-CM | POA: Diagnosis not present

## 2020-08-10 DIAGNOSIS — R29898 Other symptoms and signs involving the musculoskeletal system: Secondary | ICD-10-CM | POA: Diagnosis not present

## 2020-08-10 DIAGNOSIS — R293 Abnormal posture: Secondary | ICD-10-CM

## 2020-08-10 DIAGNOSIS — M542 Cervicalgia: Secondary | ICD-10-CM

## 2020-08-10 NOTE — Therapy (Signed)
[facility] [place] [address] [place], [place], [address] Phone: [phone]   Fax:  [phone]  Physical Therapy Treatment  Patient Details  Name: Melinda Parsons MRN: [id number] Date of Birth: 10/25/1971 Referring Provider (PT): Dr Megan Salon    Encounter Date: 08/10/2020   PT End of Session - 08/10/20 1559    Visit Number 7    Number of Visits 12    Date for PT Re-Evaluation 09/02/20    PT Start Time 1559    PT Stop Time 1647    PT Time Calculation (min) 48 min    Activity Tolerance Patient tolerated treatment well           Past Medical History:  Diagnosis Date  . Anxiety   . GERD (gastroesophageal reflux disease)   . Hypertension   . IBS (irritable bowel syndrome)   . Lumbar herniated disc   . Migraine   . Pre-eclampsia    with daughter  . PVC (premature ventricular contraction)    Previous holter in Dec 2011 showed PVCs and 2 3 beat runs of SVT  . Seasonal allergies     Past Surgical History:  Procedure Laterality Date  . NO PAST SURGERIES      There were no vitals filed for this visit.   Subjective Assessment - 08/10/20 1600    Subjective Improving. less tingling zingers in the Rt UE. Changed the chair she sit in at home and can now sit to watch TV without symptoms. 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 Working on posture and alignment. Pleased with progress.    Currently in Pain? No/denies    Pain Score 0-No pain    Pain Orientation Right    Pain Descriptors / Indicators Tightness    Pain Type Acute pain;Chronic pain              [facility] PT Assessment - 08/10/20 0001      Assessment   Medical Diagnosis Cervical dysfunction     Referring Provider (PT) Dr Megan Salon     Onset Date/Surgical Date 06/11/20    Hand Dominance Left    Next MD Visit 08/05/20    Prior Therapy 5 yrs ago for neck       AROM   Cervical Flexion 54 pain in the Rt upper trap      Cervical Extension 54    Cervical - Right Side Bend 35    Cervical - Left Side Bend 37    Cervical - Right Rotation 70    Cervical - Left Rotation 65      Palpation   Spinal mobility hypomobility thoracic and cervical spine with CPA and lateral glides greatest upper thoracic and lower cervical     Palpation comment significant muscular tightness bilat ant/lat/post cervical musculature; pecs; upper traps; leveator; teres; medial scapular/thoracic spine paraspinals                          [facility] Adult PT Treatment/Exercise - 08/10/20 0001      Neuro Re-ed    Neuro Re-ed Details  working on scapular postural control in sitting and supine with focus on position of shoulder for functional activities and movements on UE's minimal to no radicular symptoms       Neck Exercises: Seated   Other Seated Exercise postural correction working on scapular position and control for functional UE movements       Neck Exercises:  Supine   Neck Retraction 5 reps;5 secs    Neck Retraction Limitations nodding yes 5 reps     Other Supine Exercise scap squeeze/chest lift 5 sec x 5 reps     Other Supine Exercise deep diaphragmatic breathing in/out through the nose exhale:inhale 2:1 ratio x 2 min part of time with hands clasp behind head       Moist Heat Therapy   Number Minutes Moist Heat 10 Minutes    Moist Heat Location Cervical;Shoulder      Manual Therapy   Manual therapy comments skilled palpation to assess tissue response to DN and manual work     Joint Mobilization Grade II CPA mobs through the cervical spine; 1st rib and clavicle Lt > Rt ; springing through the ribs tight Rt > Lt     Soft tissue mobilization STM to pt tolerance through ant/lat/post cervical musculature; upper traps; pecs; anterior chest; bilat hands/forearms/arms     Myofascial Release Rt anterior chest - clavicular area to pecs     Manual Traction gentle cervical traction 10-15 sec hold x 3 reps during treatment              Trigger Point Dry Needling - 08/10/20 0001    Consent Given? Yes    Education Handout Provided Previously provided    Other Dry Needling Rt    Sternocleidomastoid Response Palpable increased muscle length    Upper Trapezius Response Palpable increased muscle length                PT Education - 08/10/20 1647    Education Details working on scapular control and posture with UE movements and with talking/laughing    Person(s) Educated Patient    Methods Explanation;Demonstration;Tactile cues;Verbal cues    Comprehension Verbalized understanding;Returned demonstration;Verbal cues required;Tactile cues required               PT Long Term Goals - 07/22/20 2013      PT LONG TERM GOAL #1   Title Improve posture and alignment with patient to demonstrate increased upright posture with posterior shoulder girdle engaged    Time 6    Period Weeks    Status New    Target Date 09/02/20      PT LONG TERM GOAL #2   Title Increase cervical ROM by 5-7 degrees in all planes    Time 6    Period Weeks    Status New    Target Date 09/02/20      PT LONG TERM GOAL #3   Title Patient reports resolution of all radicular UE symptoms by 75-80%    Time 6    Period Weeks    Status New    Target Date 09/02/20      PT LONG TERM GOAL #4   Title Patient demonstrates improve body mechanics and functional strength allowing her to work in her yard for 30-60 minutes w/ no increase in pain    Time 6    Period Weeks    Status New    Target Date 09/02/20      PT LONG TERM GOAL #5   Title Independent in HEP    Time 6    Period Weeks    Status New    Target Date 09/02/20      PT LONG TERM GOAL #6   Title Improve FOTO to </= 32% limitation    Time 6    Period Weeks    Status New  Target Date 09/02/20                 Plan - 08/10/20 1648    Clinical Impression Statement Continued improvement with patient reporting decreased radicular symptoms and improved  functional endurance. She has modified positions for work and home activities with good results in decreasing symptoms.    Rehab Potential Good    PT Frequency 2x / week    PT Duration 6 weeks    PT Treatment/Interventions Patient/family education;ADLs/Self Care Home Management;Aquatic Therapy;Cryotherapy;Electrical Stimulation;Iontophoresis 4mg /ml Dexamethasone;Moist Heat;Ultrasound;Functional mobility training;Therapeutic activities;Therapeutic exercise;Balance training;Neuromuscular re-education;Manual techniques;Dry needling;Taping    PT Next Visit Plan review HEP; progress exercises as tolerated; assess response to manual work and DN; postural correction; modalities as indicated; continue neuromuscular re-education; consider taping as needed    PT Home Exercise Plan [email or web address]    Consulted and Agree with Plan of Care Patient           Patient will benefit from skilled therapeutic intervention in order to improve the following deficits and impairments:     Visit Diagnosis: Cervical dysfunction  Cervicalgia  Abnormal posture  Other symptoms and signs involving the musculoskeletal system     Problem List Patient Active Problem List   Diagnosis Date Noted  . Canker sore 06/29/2020  . Leg cramp 06/26/2019  . Aortic calcification (HCC) 01/02/2017  . Carpal tunnel syndrome, left 10/05/2016  . Anxiety and depression 10/05/2016  . Right arm pain 09/10/2015  . Vitamin B12 deficiency 09/08/2015  . Degenerative disc disease, cervical 07/02/2015  . Complicated migraine 05/20/2015  . Fatigue 04/22/2015  . Left lumbar radiculitis 06/11/2014  . Bilateral plantar fascia fibromatosis 04/11/2014  . Plantar wart of right foot 02/07/2014  . Palpitations 11/30/2010    Nary Sneed 12/02/2010  PT, MPH  08/10/2020, 4:55 PM  [facility] [address] [place] [address] [place], [place], [place] Phone: [phone]   Fax:  [phone]  Name: Tashina Credit MRN: [name] Date of Birth: May 05, 1971

## 2020-08-12 ENCOUNTER — Other Ambulatory Visit: Payer: Self-pay

## 2020-08-12 ENCOUNTER — Encounter: Payer: Self-pay | Admitting: Rehabilitative and Restorative Service Providers"

## 2020-08-12 ENCOUNTER — Ambulatory Visit (INDEPENDENT_AMBULATORY_CARE_PROVIDER_SITE_OTHER): Payer: No Typology Code available for payment source | Admitting: Rehabilitative and Restorative Service Providers"

## 2020-08-12 DIAGNOSIS — R293 Abnormal posture: Secondary | ICD-10-CM

## 2020-08-12 DIAGNOSIS — M539 Dorsopathy, unspecified: Secondary | ICD-10-CM | POA: Diagnosis not present

## 2020-08-12 DIAGNOSIS — M542 Cervicalgia: Secondary | ICD-10-CM | POA: Diagnosis not present

## 2020-08-12 DIAGNOSIS — R29898 Other symptoms and signs involving the musculoskeletal system: Secondary | ICD-10-CM

## 2020-08-12 NOTE — Therapy (Signed)
[facility] [place] [address] [place], [place], [address] Phone: [phone]   Fax:  [phone]  Physical Therapy Treatment  Patient Details  Name: Melinda Parsons MRN: [id number] Date of Birth: [birth year] Referring Provider (PT): Dr Megan Salon    Encounter Date: 08/12/2020   PT End of Session - 08/12/20 0714    Visit Number 8    Number of Visits 12    Date for PT Re-Evaluation 09/02/20    PT Start Time 0712    PT Stop Time 0758    PT Time Calculation (min) 46 min    Activity Tolerance Patient tolerated treatment well           Past Medical History:  Diagnosis Date  . Anxiety   . GERD (gastroesophageal reflux disease)   . Hypertension   . IBS (irritable bowel syndrome)   . Lumbar herniated disc   . Migraine   . Pre-eclampsia    with daughter  . PVC (premature ventricular contraction)    Previous holter in Dec 2011 showed PVCs and 2 3 beat runs of SVT  . Seasonal allergies     Past Surgical History:  Procedure Laterality Date  . NO PAST SURGERIES      There were no vitals filed for this visit.   Subjective Assessment - 08/12/20 0714    Subjective Patient reports that she had increased tingling and some pain in the Rt UE following DN last visit which continued for a couple of days. Has used the heating pad and continued working on posture and alignment.    Currently in Pain? No/denies    Pain Score 0-No pain    Pain Location Neck    Pain Orientation Right    Pain Descriptors / Indicators Tightness                             [facility] Adult PT Treatment/Exercise - 08/12/20 0001      Neck Exercises: Supine   Neck Retraction 5 reps;5 secs    Neck Retraction Limitations nodding yes 5 reps     Other Supine Exercise scap squeeze/chest lift 5 sec x 5 reps     Other Supine Exercise deep diaphragmatic breathing in/out through the nose exhale:inhale 2:1 ratio x 2 min part of time with hands clasp  behind head       Shoulder Exercises: Supine   Other Supine Exercises meeks - LE extension pressing into table 5 sec x 10 LE hip and knee flexed opposite leg; UE press increased tingling in UE's trial of supine prolonged snow angel increased UE symptoms will begin with arms in ~ 20 -30 deg abduciton 20-30 sec       Manual Therapy   Joint Mobilization gentle CPA mobs cervical spine     Soft tissue mobilization STM to pt tolerance through ant/lat/post cervical musculature; upper traps; pecs; anterior chest; bilat hands/forearms/arms     Myofascial Release Rt anterior chest - clavicular area to pecs     Manual Traction gentle cervical traction 10-15 sec hold x 3 reps during treatment                   PT Education - 08/12/20 0732    Education Details HEP    Person(s) Educated Patient    Methods Explanation;Demonstration;Tactile cues;Verbal cues;Handout    Comprehension Verbalized understanding;Returned demonstration;Verbal cues required;Tactile cues required  PT Long Term Goals - 07/22/20 2013      PT LONG TERM GOAL #1   Title Improve posture and alignment with patient to demonstrate increased upright posture with posterior shoulder girdle engaged    Time 6    Period Weeks    Status New    Target Date 09/02/20      PT LONG TERM GOAL #2   Title Increase cervical ROM by 5-7 degrees in all planes    Time 6    Period Weeks    Status New    Target Date 09/02/20      PT LONG TERM GOAL #3   Title Patient reports resolution of all radicular UE symptoms by 75-80%    Time 6    Period Weeks    Status New    Target Date 09/02/20      PT LONG TERM GOAL #4   Title Patient demonstrates improve body mechanics and functional strength allowing her to work in her yard for 30-60 minutes w/ no increase in pain    Time 6    Period Weeks    Status New    Target Date 09/02/20      PT LONG TERM GOAL #5   Title Independent in HEP    Time 6    Period Weeks    Status  New    Target Date 09/02/20      PT LONG TERM GOAL #6   Title Improve FOTO to </= 32% limitation    Time 6    Period Weeks    Status New    Target Date 09/02/20                 Plan - 08/12/20 0723    Clinical Impression Statement Patient remains very reactive to most attempts at strengthening, DN and manual work. Trial of Meeks osteoporosis supine exercises. Added lumbar core stabilization. Continued with postural correction and disassociation of limbs with maintaining core.    Rehab Potential Good    PT Frequency 2x / week    PT Duration 6 weeks    PT Treatment/Interventions Patient/family education;ADLs/Self Care Home Management;Aquatic Therapy;Cryotherapy;Electrical Stimulation;Iontophoresis 4mg /ml Dexamethasone;Moist Heat;Ultrasound;Functional mobility training;Therapeutic activities;Therapeutic exercise;Balance training;Neuromuscular re-education;Manual techniques;Dry needling;Taping    PT Next Visit Plan review HEP; progress exercises as tolerated; assess response to manual work and DN; postural correction; modalities as indicated; continue neuromuscular re-education; consider taping as needed    PT Home Exercise Plan [email or web address]    Consulted and Agree with Plan of Care Patient           Patient will benefit from skilled therapeutic intervention in order to improve the following deficits and impairments:     Visit Diagnosis: Cervical dysfunction  Cervicalgia  Abnormal posture  Other symptoms and signs involving the musculoskeletal system     Problem List Patient Active Problem List   Diagnosis Date Noted  . Canker sore 06/29/2020  . Leg cramp 06/26/2019  . Aortic calcification (HCC) 01/02/2017  . Carpal tunnel syndrome, left 10/05/2016  . Anxiety and depression 10/05/2016  . Right arm pain 09/10/2015  . Vitamin B12 deficiency 09/08/2015  . Degenerative disc disease, cervical 07/02/2015  . Complicated migraine 05/20/2015  . Fatigue 04/22/2015  . Left  lumbar radiculitis 06/11/2014  . Bilateral plantar fascia fibromatosis 04/11/2014  . Plantar wart of right foot 02/07/2014  . Palpitations 11/30/2010    Jonette Wassel 12/02/2010 PT, MPH  08/12/2020, 8:03 AM  [facility] [address] [place]  [address] [place], [place], [address] Phone: [phone]   Fax:  [phone]  Name: Akane Tessier MRN: [id number] Date of Birth: October 14, 1971

## 2020-08-12 NOTE — Patient Instructions (Addendum)
Access Code: [email or web address]: [email or web address]: 09/01/2021Prepared by: Zikeria Keough HoltExercises  Standing Backward Shoulder Rolls - 2 x daily - 7 x weekly - 1 sets - 3 reps - 30 sec hold  Supine Cervical Retraction with Towel - 2 x daily - 7 x weekly - 1 sets - 5-10 reps - 5-10 sec hold  Seated Scapular Retraction - 2 x daily - 7 x weekly - 1-2 sets - 10 reps - 10 sec hold  Prone Scapular Retraction - 2 x daily - 7 x weekly - 1 sets - 5-10 reps - 3-5 sec hold  Standing Bilateral Low Shoulder Row with Anchored Resistance - 2 x daily - 7 x weekly - 1-3 sets - 10 reps - 2-3 sec hold  Standing Shoulder External Rotation with Resistance - 2 x daily - 7 x weekly - 1-3 sets - 10 reps - 2-3 sec hold  Supine Transversus Abdominis Bracing with Pelvic Floor Contraction - 2 x daily - 7 x weekly - 1 sets - 10 reps - 10sec hold   Lying on back - press one leg down and hold 5-10 sec  Start with 10 reps and increase to 2 sets of 10  If you have tingling try to bend opposite knee up   Lying on back arms at side  Press arms and hands into surface  Hold 5 sec  Start with 5-10 reps and increase as tolerated

## 2020-08-19 ENCOUNTER — Encounter: Payer: Self-pay | Admitting: Rehabilitative and Restorative Service Providers"

## 2020-08-19 ENCOUNTER — Other Ambulatory Visit: Payer: Self-pay

## 2020-08-19 ENCOUNTER — Ambulatory Visit (INDEPENDENT_AMBULATORY_CARE_PROVIDER_SITE_OTHER): Payer: No Typology Code available for payment source | Admitting: Rehabilitative and Restorative Service Providers"

## 2020-08-19 DIAGNOSIS — R293 Abnormal posture: Secondary | ICD-10-CM

## 2020-08-19 DIAGNOSIS — R29898 Other symptoms and signs involving the musculoskeletal system: Secondary | ICD-10-CM | POA: Diagnosis not present

## 2020-08-19 DIAGNOSIS — M539 Dorsopathy, unspecified: Secondary | ICD-10-CM

## 2020-08-19 DIAGNOSIS — M542 Cervicalgia: Secondary | ICD-10-CM | POA: Diagnosis not present

## 2020-08-19 NOTE — Patient Instructions (Signed)
Standing - hold theraband secured at door  Hold arms still and step back hold 5 sec and step forward  Repeat stepping to the side   10 reps 2 - 3 sets as tolerated

## 2020-08-19 NOTE — Therapy (Signed)
[facility] [place] [address] [place], [place], [address] Phone: [phone]   Fax:  [phone]  Physical Therapy Treatment  Patient Details  Name: Melinda Parsons MRN: [id number] Date of Birth: [birth year] Referring Provider (PT): Dr Megan Salon    Encounter Date: 08/19/2020   PT End of Session - 08/19/20 0715    Visit Number 9    Number of Visits 12    Date for PT Re-Evaluation 09/02/20    PT Start Time 0713    PT Stop Time 0801    PT Time Calculation (min) 48 min    Activity Tolerance Patient tolerated treatment well           Past Medical History:  Diagnosis Date  . Anxiety   . GERD (gastroesophageal reflux disease)   . Hypertension   . IBS (irritable bowel syndrome)   . Lumbar herniated disc   . Migraine   . Pre-eclampsia    with daughter  . PVC (premature ventricular contraction)    Previous holter in Dec 2011 showed PVCs and 2 3 beat runs of SVT  . Seasonal allergies     Past Surgical History:  Procedure Laterality Date  . NO PAST SURGERIES      There were no vitals filed for this visit.   Subjective Assessment - 08/19/20 0715    Subjective Patient reports that she continues to have intermittent symptoms. Pain is increased with increased upright postions standing and walking or sitting at desk. She is working on the exercises at home. She can now get rid of symptoms when she notices. Scheduled for [facility] tomorrow.    Currently in Pain? No/denies    Pain Score 0-No pain              [facility] PT Assessment - 08/19/20 0001      Assessment   Medical Diagnosis Cervical dysfunction     Referring Provider (PT) Dr Megan Salon     Onset Date/Surgical Date 06/11/20    Hand Dominance Left    Next MD Visit 08/05/20    Prior Therapy 5 yrs ago for neck       AROM   Cervical Flexion 50 pain and tightness Rt upper trap     Cervical Extension 59    Cervical - Right Side Bend 36    Cervical - Left Side Bend 37  stretching and pulling     Cervical - Right Rotation 67    Cervical - Left Rotation 65      Palpation   Spinal mobility hypomobility thoracic and cervical spine with CPA and lateral glides greatest upper thoracic and lower cervical     Palpation comment persistent muscular tightness bilat ant/lat/post cervical musculature; pecs; upper traps; leveator; teres; medial scapular/thoracic spine paraspinals                          [facility] Adult PT Treatment/Exercise - 08/19/20 0001      Shoulder Exercises: Supine   Other Supine Exercises meeks - LE extension pressing into table 5 sec x 10 LE hip and knee flexed opposite leg; UE press increased tingling in UE's trial of supine prolonged snow angel increased UE symptoms will begin with arms in ~ 20 -30 deg abduciton 20-30 sec     Other Supine Exercises chest lift x 5 reps       Shoulder Exercises: Standing   Other Standing Exercises TB step back and side steps with yellow  TB 10 resp x 2 set stepping straight back; 10 reps stepping to each side      Manual Therapy   Joint Mobilization gentle CPA mobs cervical spine     Soft tissue mobilization STM to pt tolerance through ant/lat/post cervical musculature; upper traps; pecs; anterior chest; bilat hands/forearms/arms     Myofascial Release Rt anterior chest - clavicular area to pecs     Manual Traction gentle cervical traction 10-15 sec hold x 3 reps during treatment                   PT Education - 08/19/20 0758    Education Details HEP    Person(s) Educated Patient    Methods Explanation;Demonstration;Tactile cues;Verbal cues;Handout    Comprehension Verbalized understanding;Returned demonstration;Verbal cues required;Tactile cues required               PT Long Term Goals - 07/22/20 2013      PT LONG TERM GOAL #1   Title Improve posture and alignment with patient to demonstrate increased upright posture with posterior shoulder girdle engaged    Time 6    Period  Weeks    Status New    Target Date 09/02/20      PT LONG TERM GOAL #2   Title Increase cervical ROM by 5-7 degrees in all planes    Time 6    Period Weeks    Status New    Target Date 09/02/20      PT LONG TERM GOAL #3   Title Patient reports resolution of all radicular UE symptoms by 75-80%    Time 6    Period Weeks    Status New    Target Date 09/02/20      PT LONG TERM GOAL #4   Title Patient demonstrates improve body mechanics and functional strength allowing her to work in her yard for 30-60 minutes w/ no increase in pain    Time 6    Period Weeks    Status New    Target Date 09/02/20      PT LONG TERM GOAL #5   Title Independent in HEP    Time 6    Period Weeks    Status New    Target Date 09/02/20      PT LONG TERM GOAL #6   Title Improve FOTO to </= 32% limitation    Time 6    Period Weeks    Status New    Target Date 09/02/20                 Plan - 08/19/20 4132    Clinical Impression Statement Continued limitation in cervical mobilty and ROM. Pain with cervical flexion. Patient added stabilization exercises with TB keeping UE's stationary and moving body with no increase in symptoms. Persistent muscular tightness Rt > Lt upper quarter. Scheduled for cervical ESI tomorrow.    Rehab Potential Good    PT Frequency 2x / week    PT Duration 6 weeks    PT Treatment/Interventions Patient/family education;ADLs/Self Care Home Management;Aquatic Therapy;Cryotherapy;Electrical Stimulation;Iontophoresis 4mg /ml Dexamethasone;Moist Heat;Ultrasound;Functional mobility training;Therapeutic activities;Therapeutic exercise;Balance training;Neuromuscular re-education;Manual techniques;Dry needling;Taping    PT Next Visit Plan review HEP; progress exercises as tolerated; assess response to manual work and DN; postural correction; modalities as indicated; continue neuromuscular re-education; consider taping as needed    PT Home Exercise Plan [email or web address]    Consulted and  Agree with Plan of Care Patient  Patient will benefit from skilled therapeutic intervention in order to improve the following deficits and impairments:     Visit Diagnosis: Cervical dysfunction  Cervicalgia  Abnormal posture  Other symptoms and signs involving the musculoskeletal system     Problem List Patient Active Problem List   Diagnosis Date Noted  . Canker sore 06/29/2020  . Leg cramp 06/26/2019  . Aortic calcification (HCC) 01/02/2017  . Carpal tunnel syndrome, left 10/05/2016  . Anxiety and depression 10/05/2016  . Right arm pain 09/10/2015  . Vitamin B12 deficiency 09/08/2015  . Degenerative disc disease, cervical 07/02/2015  . Complicated migraine 05/20/2015  . Fatigue 04/22/2015  . Left lumbar radiculitis 06/11/2014  . Bilateral plantar fascia fibromatosis 04/11/2014  . Plantar wart of right foot 02/07/2014  . Palpitations 11/30/2010    Zafir Schauer Rober Minion PT, MPH  08/19/2020, 7:59 AM  [facility] [address] [place] [address] [place], [place], [address] Phone: [phone]   Fax:  [phone]  Name: Emika Tiano MRN: [id number] Date of Birth: 1971/05/19

## 2020-08-21 ENCOUNTER — Encounter: Payer: No Typology Code available for payment source | Admitting: Rehabilitative and Restorative Service Providers"

## 2020-08-21 ENCOUNTER — Encounter: Payer: Self-pay | Admitting: Family Medicine

## 2020-08-21 DIAGNOSIS — F32A Depression, unspecified: Secondary | ICD-10-CM

## 2020-08-21 DIAGNOSIS — F419 Anxiety disorder, unspecified: Secondary | ICD-10-CM

## 2020-08-21 MED ORDER — BUSPIRONE HCL 5 MG PO TABS: 5.0000 mg | ORAL_TABLET | Freq: Three times a day (TID) | ORAL | 1 refills | Status: DC

## 2020-08-21 NOTE — Telephone Encounter (Signed)
I think she is talking about Buspar?  I updated her script

## 2020-08-24 ENCOUNTER — Ambulatory Visit (INDEPENDENT_AMBULATORY_CARE_PROVIDER_SITE_OTHER): Payer: No Typology Code available for payment source | Admitting: Rehabilitative and Restorative Service Providers"

## 2020-08-24 ENCOUNTER — Other Ambulatory Visit: Payer: Self-pay

## 2020-08-24 ENCOUNTER — Encounter: Payer: Self-pay | Admitting: Rehabilitative and Restorative Service Providers"

## 2020-08-24 DIAGNOSIS — M542 Cervicalgia: Secondary | ICD-10-CM

## 2020-08-24 DIAGNOSIS — R29898 Other symptoms and signs involving the musculoskeletal system: Secondary | ICD-10-CM | POA: Diagnosis not present

## 2020-08-24 DIAGNOSIS — M539 Dorsopathy, unspecified: Secondary | ICD-10-CM

## 2020-08-24 DIAGNOSIS — R293 Abnormal posture: Secondary | ICD-10-CM

## 2020-08-24 NOTE — Therapy (Signed)
[facility] [place] [address] [place] [address] [place], [place], [address] Phone: [phone]   Fax:  [phone]  Physical Therapy Treatment  Patient Details  Name: Melinda Parsons MRN: [id number] Date of Birth: 10/22/1971 Referring Provider (PT): Dr Megan Salon    Encounter Date: 08/24/2020   PT End of Session - 08/24/20 0717    Visit Number 10    Number of Visits 12    Date for PT Re-Evaluation 09/02/20    PT Start Time 0715    PT Stop Time 0800   MH end of treatment   PT Time Calculation (min) 45 min    Activity Tolerance Patient tolerated treatment well           Past Medical History:  Diagnosis Date  . Anxiety   . GERD (gastroesophageal reflux disease)   . Hypertension   . IBS (irritable bowel syndrome)   . Lumbar herniated disc   . Migraine   . Pre-eclampsia    with daughter  . PVC (premature ventricular contraction)    Previous holter in Dec 2011 showed PVCs and 2 3 beat runs of SVT  . Seasonal allergies     Past Surgical History:  Procedure Laterality Date  . NO PAST SURGERIES      There were no vitals filed for this visit.   Subjective Assessment - 08/24/20 0718    Subjective Had ESI Thursday - good initial response but began to have "zingers" within two hours and continued to have some zingers throughout the weekend. Did not work on exercises. She did walk several times over the weekend. Her hands and feet have been "vibrating" and cold. She is now working back in the office. Chair is good ergonomically. Has a stand up desk and large monitors.    Currently in Pain? No/denies    Pain Score 2     Pain Location Neck    Pain Orientation Left    Pain Descriptors / Indicators Tightness    Pain Type Acute pain;Chronic pain    Pain Radiating Towards zingers into both arms intermittently    Pain Onset More than a month ago    Pain Frequency Intermittent              [facility] PT Assessment - 08/24/20 0001       Assessment   Medical Diagnosis Cervical dysfunction     Referring Provider (PT) Dr Megan Salon     Onset Date/Surgical Date 06/11/20    Hand Dominance Left    Next MD Visit 08/05/20    Prior Therapy 5 yrs ago for neck       AROM   Cervical Flexion 52 pain and tightness Rt upper trap     Cervical Extension 60    Cervical - Right Side Bend 36 tight    Cervical - Left Side Bend 36 tight    Cervical - Right Rotation 71    Cervical - Left Rotation 70      Palpation   Spinal mobility hypomobility thoracic and cervical spine with CPA and lateral glides greatest upper thoracic and lower cervical     Palpation comment persistent muscular tightness bilat ant/lat/post cervical musculature; pecs; upper traps; leveator; teres; medial scapular/thoracic spine paraspinals                          [facility] Adult PT Treatment/Exercise - 08/24/20 0001      Neck Exercises: Seated   Neck Retraction 5  reps    Cervical Rotation Right;Left   2 reps    Lateral Flexion Right;Left   2 reps    Shoulder Rolls Backwards;10 reps      Neck Exercises: Supine   Neck Retraction --   trial of chin tuck increased radicular symptoms    Neck Retraction Limitations nodding yes 5 reps     Shoulder Flexion Right;Left;10 reps    Other Supine Exercise scap squeeze/chest lift 5 sec x 5 reps 3 sets through Rx     Other Supine Exercise deep diaphragmatic breathing in/out through the nose exhale:inhale 2:1 ratio x 2 min part of time with hands clasp behind head       Shoulder Exercises: Supine   Other Supine Exercises meeks - LE extension pressing into table 5 sec x 2 LE hip and knee flexed opposite leg increased symptoms; UE press increased tingling in UE's trial of supine prolonged snow angel increased UE symptoms will begin with arms in ~ 20 -30 deg abduciton 20-30 sec - trial of wrist flexion and extension (nerve flossing) tolerated ~ 10 reps before zigners       Manual Therapy   Joint Mobilization gentle  clavicular mobs - significant tightness and onset of UE symptoms with mobs on Rt     Soft tissue mobilization STM to pt tolerance through ant/lat/post cervical musculature; upper traps; pecs; anterior chest; bilat hands/forearms/arms     Myofascial Release Rt anterior chest - clavicular area to pecs     Manual Traction gentle cervical traction 10-15 sec hold x 3 reps during treatment                        PT Long Term Goals - 07/22/20 2013      PT LONG TERM GOAL #1   Title Improve posture and alignment with patient to demonstrate increased upright posture with posterior shoulder girdle engaged    Time 6    Period Weeks    Status New    Target Date 09/02/20      PT LONG TERM GOAL #2   Title Increase cervical ROM by 5-7 degrees in all planes    Time 6    Period Weeks    Status New    Target Date 09/02/20      PT LONG TERM GOAL #3   Title Patient reports resolution of all radicular UE symptoms by 75-80%    Time 6    Period Weeks    Status New    Target Date 09/02/20      PT LONG TERM GOAL #4   Title Patient demonstrates improve body mechanics and functional strength allowing her to work in her yard for 30-60 minutes w/ no increase in pain    Time 6    Period Weeks    Status New    Target Date 09/02/20      PT LONG TERM GOAL #5   Title Independent in HEP    Time 6    Period Weeks    Status New    Target Date 09/02/20      PT LONG TERM GOAL #6   Title Improve FOTO to </= 32% limitation    Time 6    Period Weeks    Status New    Target Date 09/02/20                 Plan - 08/24/20 0724    Clinical Impression Statement Initial positive  response to [facility] but then began to experience "zingers" which have persisted throughout the weekend. Patient demonstrates some improved cervical mobility; posture is gradually improving; pain has decreased in intensity and frequency. Patient has persistent nerve irritation and radicular symptoms involving the whole  spine - exercises/movement through the UE's will create pain in the LB and movement in the LE's will create "zingers" in the Rt > Lt UE. Tolerates minimal manual work before onset of UE symptoms. Will continue efforts.    Rehab Potential Good    PT Frequency 2x / week    PT Duration 6 weeks    PT Treatment/Interventions Patient/family education;ADLs/Self Care Home Management;Aquatic Therapy;Cryotherapy;Electrical Stimulation;Iontophoresis 4mg /ml Dexamethasone;Moist Heat;Ultrasound;Functional mobility training;Therapeutic activities;Therapeutic exercise;Balance training;Neuromuscular re-education;Manual techniques;Dry needling;Taping    PT Next Visit Plan review HEP; progress exercises as tolerated; assess response to manual work and DN; postural correction; modalities as indicated; continue neuromuscular re-education; consider taping as needed    PT Home Exercise Plan [email or web address]    Consulted and Agree with Plan of Care Patient           Patient will benefit from skilled therapeutic intervention in order to improve the following deficits and impairments:     Visit Diagnosis: Cervical dysfunction  Cervicalgia  Abnormal posture  Other symptoms and signs involving the musculoskeletal system     Problem List Patient Active Problem List   Diagnosis Date Noted  . Canker sore 06/29/2020  . Leg cramp 06/26/2019  . Aortic calcification (HCC) 01/02/2017  . Carpal tunnel syndrome, left 10/05/2016  . Anxiety and depression 10/05/2016  . Right arm pain 09/10/2015  . Vitamin B12 deficiency 09/08/2015  . Degenerative disc disease, cervical 07/02/2015  . Complicated migraine 05/20/2015  . Fatigue 04/22/2015  . Left lumbar radiculitis 06/11/2014  . Bilateral plantar fascia fibromatosis 04/11/2014  . Plantar wart of right foot 02/07/2014  . Palpitations 11/30/2010    Daisy Mcneel 12/02/2010 PT, MPH  08/24/2020, 7:59 AM  [facility] [address] [place] [address] [place], [place], [place] Phone: [phone]   Fax:  [phone]  Name: Melinda Parsons MRN: [name] Date of Birth: [birth year]

## 2020-08-26 ENCOUNTER — Encounter: Payer: No Typology Code available for payment source | Admitting: Rehabilitative and Restorative Service Providers"

## 2020-08-31 ENCOUNTER — Encounter: Payer: Self-pay | Admitting: Rehabilitative and Restorative Service Providers"

## 2020-08-31 ENCOUNTER — Other Ambulatory Visit: Payer: Self-pay

## 2020-08-31 ENCOUNTER — Ambulatory Visit (INDEPENDENT_AMBULATORY_CARE_PROVIDER_SITE_OTHER): Payer: No Typology Code available for payment source | Admitting: Rehabilitative and Restorative Service Providers"

## 2020-08-31 DIAGNOSIS — R293 Abnormal posture: Secondary | ICD-10-CM

## 2020-08-31 DIAGNOSIS — M542 Cervicalgia: Secondary | ICD-10-CM | POA: Diagnosis not present

## 2020-08-31 DIAGNOSIS — M539 Dorsopathy, unspecified: Secondary | ICD-10-CM

## 2020-08-31 DIAGNOSIS — R29898 Other symptoms and signs involving the musculoskeletal system: Secondary | ICD-10-CM

## 2020-08-31 NOTE — Therapy (Signed)
[facility] [place] [address] [place] [address] [place], [place], [address] Phone: [phone]   Fax:  [phone]  Physical Therapy Treatment  Patient Details  Name: Melinda Parsons MRN: [id number] Date of Birth: 12-02-1971 Referring Provider (PT): Dr Megan Salon    Encounter Date: 08/31/2020   PT End of Session - 08/31/20 0719    Visit Number 11    Number of Visits 12    Date for PT Re-Evaluation 09/02/20    PT Start Time 0718    PT Stop Time 0800    PT Time Calculation (min) 42 min    Activity Tolerance Patient tolerated treatment well           Past Medical History:  Diagnosis Date   Anxiety    GERD (gastroesophageal reflux disease)    Hypertension    IBS (irritable bowel syndrome)    Lumbar herniated disc    Migraine    Pre-eclampsia    with daughter   PVC (premature ventricular contraction)    Previous holter in Dec 2011 showed PVCs and 2 3 beat runs of SVT   Seasonal allergies     Past Surgical History:  Procedure Laterality Date   NO PAST SURGERIES      There were no vitals filed for this visit.   Subjective Assessment - 08/31/20 0721    Subjective Feels that stress is "the driving force" for much of her probems" radicular symptoms. She is now having burning and tingling into her feet. Frustrated with slow progress. Will hold PT for now until she sees Dr T next week.    Pertinent History HTN; anxiety; depression; hx of neck and back pain with ESI for each ~ 4-5 yrs ago    Currently in Pain? No/denies    Pain Score 0-No pain    Pain Orientation Left    Pain Descriptors / Indicators Burning;Tingling    Pain Type Acute pain;Chronic pain    Pain Onset More than a month ago    Pain Frequency Intermittent                             [facility] Adult PT Treatment/Exercise - 08/31/20 0001      Lumbar Exercises: Quadruped   Madcat/Old Horse 5 reps    Single Arm Raise Right;Left;5 reps;2 seconds      Straight Leg Raise 5 reps;2 seconds   lifting toe from surface to engage more core less gluts    Opposite Arm/Leg Raise Right arm/Left leg;Left arm/Right leg;5 reps      Shoulder Exercises: Standing   Other Standing Exercises standing axial extension less increased Rt UE radicular pain; tolerated scap squeeze and L's W's well - scap squeeze 5 sec x 5; L's x 10; W's x 10 with noodle       Manual Therapy   Joint Mobilization gentle clavicular mobs - significant tightness and onset of UE symptoms with mobs on Rt     Soft tissue mobilization STM to pt tolerance through ant/lat/post cervical musculature; upper traps; pecs; anterior chest; bilat hands/forearms/arms     Myofascial Release Rt anterior chest - clavicular area to pecs     Passive ROM cervical flexion in supine; flexion with slight rotation 20 sec hold x 2 reps each - decresaed radicular symptoms     Manual Traction gentle cervical traction 10-15 sec hold x 3 reps during treatment  PT Education - 08/31/20 0737    Education Details HEP    Person(s) Educated Patient    Methods Explanation;Demonstration;Tactile cues;Verbal cues;Handout    Comprehension Verbalized understanding;Returned demonstration;Verbal cues required;Tactile cues required               PT Long Term Goals - 07/22/20 2013      PT LONG TERM GOAL #1   Title Improve posture and alignment with patient to demonstrate increased upright posture with posterior shoulder girdle engaged    Time 6    Period Weeks    Status New    Target Date 09/02/20      PT LONG TERM GOAL #2   Title Increase cervical ROM by 5-7 degrees in all planes    Time 6    Period Weeks    Status New    Target Date 09/02/20      PT LONG TERM GOAL #3   Title Patient reports resolution of all radicular UE symptoms by 75-80%    Time 6    Period Weeks    Status New    Target Date 09/02/20      PT LONG TERM GOAL #4   Title Patient demonstrates improve body  mechanics and functional strength allowing her to work in her yard for 30-60 minutes w/ no increase in pain    Time 6    Period Weeks    Status New    Target Date 09/02/20      PT LONG TERM GOAL #5   Title Independent in HEP    Time 6    Period Weeks    Status New    Target Date 09/02/20      PT LONG TERM GOAL #6   Title Improve FOTO to </= 32% limitation    Time 6    Period Weeks    Status New    Target Date 09/02/20                 Plan - 08/31/20 0726    Clinical Impression Statement Persistent intermittent radicular symptoms in bilat UE's and LE's. Symptoms are increased with mobilization through the clavicular area as well as most exercises. Patient tolerated quadruped exercises with no increase in symptoms initially. UE symptoms were both incresaed and decresaed with manual work. UE and LE symptoms were back to baseline following treatment.    Rehab Potential Good    PT Frequency 2x / week    PT Duration 6 weeks    PT Treatment/Interventions Patient/family education;ADLs/Self Care Home Management;Aquatic Therapy;Cryotherapy;Electrical Stimulation;Iontophoresis 4mg /ml Dexamethasone;Moist Heat;Ultrasound;Functional mobility training;Therapeutic activities;Therapeutic exercise;Balance training;Neuromuscular re-education;Manual techniques;Dry needling;Taping    PT Next Visit Plan review HEP; progress exercises as tolerated; assess response to manual work;  postural correction; modalities as indicated; continue neuromuscular re-education; consider taping as needed    PT Home Exercise Plan [email or web address]    Consulted and Agree with Plan of Care Patient           Patient will benefit from skilled therapeutic intervention in order to improve the following deficits and impairments:     Visit Diagnosis: Cervical dysfunction  Cervicalgia  Abnormal posture  Other symptoms and signs involving the musculoskeletal system     Problem List Patient Active Problem List    Diagnosis Date Noted   Canker sore 06/29/2020   Leg cramp 06/26/2019   Aortic calcification (HCC) 01/02/2017   Carpal tunnel syndrome, left 10/05/2016   Anxiety and depression 10/05/2016   Right arm pain 09/10/2015  Vitamin B12 deficiency 09/08/2015   Degenerative disc disease, cervical 07/02/2015   Complicated migraine 05/20/2015   Fatigue 04/22/2015   Left lumbar radiculitis 06/11/2014   Bilateral plantar fascia fibromatosis 04/11/2014   Plantar wart of right foot 02/07/2014   Palpitations 11/30/2010    Bray Vickerman Rober Minion PT, MPH  08/31/2020, 1:22 PM  [facility] [address] [place] [address] [place], [place], [address] Phone: [phone]   Fax:  [phone]  Name: Caryn Gienger MRN: [id number] Date of Birth: [birth year]

## 2020-08-31 NOTE — Patient Instructions (Signed)
Access Code: [email or web address]: [email or web address]: 09/20/2021Prepared by: Dakari Cregger HoltExercises  Standing Backward Shoulder Rolls - 2 x daily - 7 x weekly - 1 sets - 3 reps - 30 sec hold  Supine Cervical Retraction with Towel - 2 x daily - 7 x weekly - 1 sets - 5-10 reps - 5-10 sec hold  Seated Scapular Retraction - 2 x daily - 7 x weekly - 1-2 sets - 10 reps - 10 sec hold  Prone Scapular Retraction - 2 x daily - 7 x weekly - 1 sets - 5-10 reps - 3-5 sec hold  Standing Bilateral Low Shoulder Row with Anchored Resistance - 2 x daily - 7 x weekly - 1-3 sets - 10 reps - 2-3 sec hold  Standing Shoulder External Rotation with Resistance - 2 x daily - 7 x weekly - 1-3 sets - 10 reps - 2-3 sec hold  Supine Transversus Abdominis Bracing with Pelvic Floor Contraction - 2 x daily - 7 x weekly - 1 sets - 10 reps - 10sec hold  Cat-Camel - 2 x daily - 7 x weekly - 1 sets - 5-10 reps - 3-5 sec hold  Beginner Front Arm Support - 2 x daily - 7 x weekly - 1 sets - 10 reps - 3-5 sec hold  Quadruped Alternating Shoulder Flexion - 2 x daily - 7 x weekly - 1 sets - 3 reps - 30 sec hold  Bird Dog - 2 x daily - 7 x weekly - 1 sets - 10 reps - 3-5 sec hold

## 2020-09-02 ENCOUNTER — Other Ambulatory Visit: Payer: Self-pay

## 2020-09-02 ENCOUNTER — Ambulatory Visit (INDEPENDENT_AMBULATORY_CARE_PROVIDER_SITE_OTHER): Payer: No Typology Code available for payment source | Admitting: Rehabilitative and Restorative Service Providers"

## 2020-09-02 ENCOUNTER — Encounter: Payer: Self-pay | Admitting: Rehabilitative and Restorative Service Providers"

## 2020-09-02 DIAGNOSIS — M542 Cervicalgia: Secondary | ICD-10-CM

## 2020-09-02 DIAGNOSIS — R293 Abnormal posture: Secondary | ICD-10-CM | POA: Diagnosis not present

## 2020-09-02 DIAGNOSIS — R29898 Other symptoms and signs involving the musculoskeletal system: Secondary | ICD-10-CM

## 2020-09-02 DIAGNOSIS — M539 Dorsopathy, unspecified: Secondary | ICD-10-CM

## 2020-09-02 NOTE — Therapy (Addendum)
[facility] [address] [place], [place], [address] Phone: [phone]   Fax:  [phone]  Physical Therapy Treatment and Discharge  Patient Details  Name: Melinda Parsons MRN: [id number] Date of Birth: September 05, 1971 Referring Provider (PT): Dr Sophronia Simas    Encounter Date: 09/02/2020   PT End of Session - 09/02/20 0720    Visit Number 12    Number of Visits 12    Date for PT Re-Evaluation 09/02/20    PT Start Time 0718    PT Stop Time 0800    PT Time Calculation (min) 42 min    Activity Tolerance Patient tolerated treatment well           Past Medical History:  Diagnosis Date  . Anxiety   . GERD (gastroesophageal reflux disease)   . Hypertension   . IBS (irritable bowel syndrome)   . Lumbar herniated disc   . Migraine   . Pre-eclampsia    with daughter  . PVC (premature ventricular contraction)    Previous holter in Dec 2011 showed PVCs and 2 3 beat runs of SVT  . Seasonal allergies     Past Surgical History:  Procedure Laterality Date  . NO PAST SURGERIES      There were no vitals filed for this visit.   Subjective Assessment - 09/02/20 0720    Subjective Less pain but continued intermittent tingling and burning in UE's and LE's.              [facility] PT Assessment - 09/02/20 0001      Assessment   Medical Diagnosis Cervical dysfunction     Referring Provider (PT) Dr Sophronia Simas     Onset Date/Surgical Date 06/11/20    Hand Dominance Left    Next MD Visit 08/05/20    Prior Therapy 5 yrs ago for neck       Sensation   Additional Comments intermittent tingling and burning in hands and feet       AROM   Cervical Flexion 50 tightness with tingling in Lt > Rt hands     Cervical Extension 67    Cervical - Right Side Bend 37 tight    Cervical - Left Side Bend 36 tight    Cervical - Right Rotation 74    Cervical - Left Rotation 68      Palpation   Spinal mobility hypomobility thoracic and  cervical spine with CPA and lateral glides greatest upper thoracic and lower cervical     Palpation comment persistent muscular tightness bilat ant/lat/post cervical musculature; pecs; upper traps; leveator; teres; medial scapular/thoracic spine paraspinals       Special Tests   Other special tests (+) Thoracic outlet test Rt > Lt                          [facility] Adult PT Treatment/Exercise - 09/02/20 0001      Neuro Re-ed    Neuro Re-ed Details  working on scapular postural control in sitting and supine with focus on position of shoulder for functional activities and movements on UE's minimal to no radicular symptoms       Neck Exercises: Seated   Neck Retraction 5 reps      Lumbar Exercises: Quadruped   Madcat/Old Horse 5 reps    Single Arm Raise Right;Left;2 seconds;10 reps    Straight Leg Raise 5 reps;2 seconds   lifting toe from surface to engage more core  less gluts    Opposite Arm/Leg Raise Right arm/Left leg;Left arm/Right leg;5 reps      Shoulder Exercises: Standing   Other Standing Exercises wall plank 30 sec x 3 reps     Other Standing Exercises TB step back and side steps with yellow TB 10 resp x 2 set stepping straight back; 10 reps stepping to each side      Manual Therapy   Joint Mobilization 1st rib and clavicular mobs - significant tightness and onset of UE symptoms with mobs on Rt     Soft tissue mobilization STM to pt tolerance through ant/lat/post cervical musculature; upper traps; pecs; anterior chest; bilat hands/forearms/arms     Myofascial Release Rt anterior chest - clavicular area to pecs     Passive ROM cervical flexion in supine; flexion with slight rotation 20 sec hold x 2 reps each - decresaed radicular symptoms     Manual Traction gentle cervical traction 10-15 sec hold x 3 reps during treatment                   PT Education - 09/02/20 0738    Education Details HEP    Person(s) Educated Patient    Methods  Explanation;Demonstration;Tactile cues;Verbal cues;Handout    Comprehension Verbalized understanding;Returned demonstration;Verbal cues required;Tactile cues required               PT Long Term Goals - 07/22/20 2013      PT LONG TERM GOAL #1   Title Improve posture and alignment with patient to demonstrate increased upright posture with posterior shoulder girdle engaged    Time 6    Period Weeks    Status New    Target Date 09/02/20      PT LONG TERM GOAL #2   Title Increase cervical ROM by 5-7 degrees in all planes    Time 6    Period Weeks    Status New    Target Date 09/02/20      PT LONG TERM GOAL #3   Title Patient reports resolution of all radicular UE symptoms by 75-80%    Time 6    Period Weeks    Status New    Target Date 09/02/20      PT LONG TERM GOAL #4   Title Patient demonstrates improve body mechanics and functional strength allowing her to work in her yard for 30-60 minutes w/ no increase in pain    Time 6    Period Weeks    Status New    Target Date 09/02/20      PT LONG TERM GOAL #5   Title Independent in HEP    Time 6    Period Weeks    Status New    Target Date 09/02/20      PT LONG TERM GOAL #6   Title Improve FOTO to </= 32% limitation    Time 6    Period Weeks    Status New    Target Date 09/02/20                 Plan - 09/02/20 0733    Clinical Impression Statement Improvement in pain but persistent tinging and burning in bilat hands and feet. Increase noted in cervical ROM; improvement in posture and alignment. Symptoms are increased with head forward activities of work and home as well as stress. Progressing strengthening has been difficult due to increase in pain with resistive or wt bearing exercises. Goals are only partially  accomplished. May benefit from continued treatment to gradually resolve radicular symptoms.    Rehab Potential Good    PT Frequency 2x / week    PT Duration 6 weeks    PT Treatment/Interventions  Patient/family education;ADLs/Self Care Home Management;Aquatic Therapy;Cryotherapy;Electrical Stimulation;Iontophoresis 53m/ml Dexamethasone;Moist Heat;Ultrasound;Functional mobility training;Therapeutic activities;Therapeutic exercise;Balance training;Neuromuscular re-education;Manual techniques;Dry needling;Taping    PT Next Visit Plan Note to MD    PT Home Exercise Plan [email or web address]    Consulted and Agree with Plan of Care Patient           Patient will benefit from skilled therapeutic intervention in order to improve the following deficits and impairments:     Visit Diagnosis: Cervical dysfunction  Cervicalgia  Abnormal posture  Other symptoms and signs involving the musculoskeletal system     Problem List Patient Active Problem List   Diagnosis Date Noted  . Canker sore 06/29/2020  . Leg cramp 06/26/2019  . Aortic calcification ([place] 01/02/2017  . Carpal tunnel syndrome, left 10/05/2016  . Anxiety and depression 10/05/2016  . Right arm pain 09/10/2015  . Vitamin B12 deficiency 09/08/2015  . Degenerative disc disease, cervical 07/02/2015  . Complicated migraine 048/30/1599 . Fatigue 04/22/2015  . Left lumbar radiculitis 06/11/2014  . Bilateral plantar fascia fibromatosis 04/11/2014  . Plantar wart of right foot 02/07/2014  . Palpitations 11/30/2010   PHYSICAL THERAPY DISCHARGE SUMMARY  Visits from Start of Care: 12  Current functional level related to goals / functional outcomes: Goals partially met   Remaining deficits: See above   Education / Equipment: HEP Plan: Patient agrees to discharge.  Patient goals were partially met. Patient is being discharged due to not returning since the last visit.  ?????     DONAWERTH,KAREN, PT  Marsi Turvey PNilda SimmerPT, MPH    09/02/2020, 7:58 AM  [facility] [address]Phone: [phone]  Fax:  [phone] Name: SShaili DonalsonMRN:  [id number] of Birth: 1September 02, 1972

## 2020-09-02 NOTE — Patient Instructions (Addendum)
Questions re - condition  Access Code: [email or web address]: [email or web address]: 09/22/2021Prepared by: Alexarae Oliva HoltExercises  Standing Backward Shoulder Rolls - 2 x daily - 7 x weekly - 1 sets - 3 reps - 30 sec hold  Supine Cervical Retraction with Towel - 2 x daily - 7 x weekly - 1 sets - 5-10 reps - 5-10 sec hold  Seated Scapular Retraction - 2 x daily - 7 x weekly - 1-2 sets - 10 reps - 10 sec hold  Prone Scapular Retraction - 2 x daily - 7 x weekly - 1 sets - 5-10 reps - 3-5 sec hold  Standing Bilateral Low Shoulder Row with Anchored Resistance - 2 x daily - 7 x weekly - 1-3 sets - 10 reps - 2-3 sec hold  Standing Shoulder External Rotation with Resistance - 2 x daily - 7 x weekly - 1-3 sets - 10 reps - 2-3 sec hold  Supine Transversus Abdominis Bracing with Pelvic Floor Contraction - 2 x daily - 7 x weekly - 1 sets - 10 reps - 10sec hold  Cat-Camel - 2 x daily - 7 x weekly - 1 sets - 5-10 reps - 3-5 sec hold  Beginner Front Arm Support - 2 x daily - 7 x weekly - 1 sets - 10 reps - 3-5 sec hold  Quadruped Alternating Shoulder Flexion - 2 x daily - 7 x weekly - 1 sets - 3 reps - 30 sec hold  Bird Dog - 2 x daily - 7 x weekly - 1 sets - 10 reps - 3-5 sec hold  Standing Plank on Wall - 2 x daily - 7 x weekly - 1 sets - 3 reps - 30 sec hold

## 2020-09-07 ENCOUNTER — Encounter: Payer: No Typology Code available for payment source | Admitting: Rehabilitative and Restorative Service Providers"

## 2020-09-09 ENCOUNTER — Encounter: Payer: No Typology Code available for payment source | Admitting: Rehabilitative and Restorative Service Providers"

## 2020-09-10 ENCOUNTER — Ambulatory Visit (INDEPENDENT_AMBULATORY_CARE_PROVIDER_SITE_OTHER): Payer: No Typology Code available for payment source | Admitting: Sports Medicine

## 2020-09-10 DIAGNOSIS — M5030 Other cervical disc degeneration, unspecified cervical region: Secondary | ICD-10-CM

## 2020-09-10 NOTE — Progress Notes (Signed)
° ° °  Procedures performed today:    None.  Independent interpretation of notes and tests performed by another provider:   None.  Brief History, Exam, Impression, and Recommendations:    Degenerative disc disease, cervical Disease returns, she is a pleasant 49 year old female with degenerative disc disease, she has a good sized C5-C6 disc protrusion causing central canal stenosis, she does have right-sided radicular symptoms, she has had physical therapy, gabapentin, epidural injections. Unfortunately she continues to have discomfort, she would like a couple of opinions to discuss ACDF, we will use Dr. Artis Flock and Dr. Yetta Barre. Return to see me in an as-needed basis.    ___________________________________________ Ihor Austin. Benjamin Stain, M.D., ABFM., CAQSM. Primary Care and Sports Medicine [facility]  Adjunct Instructor of Family Medicine  [employer]

## 2020-09-10 NOTE — Assessment & Plan Note (Signed)
Disease returns, she is a pleasant 49 year old female with degenerative disc disease, she has a good sized C5-C6 disc protrusion causing central canal stenosis, she does have right-sided radicular symptoms, she has had physical therapy, gabapentin, epidural injections. Unfortunately she continues to have discomfort, she would like a couple of opinions to discuss ACDF, we will use Dr. Artis Flock and Dr. Yetta Barre. Return to see me in an as-needed basis.

## 2020-09-11 DIAGNOSIS — M5030 Other cervical disc degeneration, unspecified cervical region: Secondary | ICD-10-CM

## 2020-09-15 ENCOUNTER — Ambulatory Visit: Payer: No Typology Code available for payment source | Admitting: Sports Medicine

## 2020-09-22 LAB — HM MAMMOGRAPHY

## 2020-10-28 ENCOUNTER — Other Ambulatory Visit: Payer: Self-pay | Admitting: Sports Medicine

## 2020-10-28 DIAGNOSIS — G43109 Migraine with aura, not intractable, without status migrainosus: Secondary | ICD-10-CM

## 2020-11-01 ENCOUNTER — Other Ambulatory Visit: Payer: Self-pay | Admitting: Sports Medicine

## 2020-11-01 DIAGNOSIS — M5030 Other cervical disc degeneration, unspecified cervical region: Secondary | ICD-10-CM

## 2020-12-09 ENCOUNTER — Encounter: Payer: Self-pay | Admitting: Family Medicine

## 2020-12-09 NOTE — Progress Notes (Signed)
Error

## 2021-01-23 ENCOUNTER — Other Ambulatory Visit: Payer: Self-pay | Admitting: Family Medicine

## 2021-01-23 DIAGNOSIS — F32A Depression, unspecified: Secondary | ICD-10-CM

## 2021-01-23 DIAGNOSIS — F419 Anxiety disorder, unspecified: Secondary | ICD-10-CM

## 2021-01-25 NOTE — Telephone Encounter (Signed)
Please call pt for f/u on buspar.

## 2021-01-26 NOTE — Telephone Encounter (Signed)
Patient scheduled for 1:20 on Friday, 01/29/21. Patient was wondering can this be done virtually or does it have to be in person? AM

## 2021-01-26 NOTE — Telephone Encounter (Signed)
Virtual is fine

## 2021-01-26 NOTE — Telephone Encounter (Signed)
LVM letting patient know this. AM

## 2021-01-29 ENCOUNTER — Encounter: Payer: Self-pay | Admitting: Family Medicine

## 2021-01-29 ENCOUNTER — Telehealth (INDEPENDENT_AMBULATORY_CARE_PROVIDER_SITE_OTHER): Payer: No Typology Code available for payment source | Admitting: Family Medicine

## 2021-01-29 DIAGNOSIS — E538 Deficiency of other specified B group vitamins: Secondary | ICD-10-CM

## 2021-01-29 DIAGNOSIS — F32A Depression, unspecified: Secondary | ICD-10-CM

## 2021-01-29 DIAGNOSIS — F419 Anxiety disorder, unspecified: Secondary | ICD-10-CM | POA: Diagnosis not present

## 2021-01-29 DIAGNOSIS — M5030 Other cervical disc degeneration, unspecified cervical region: Secondary | ICD-10-CM | POA: Diagnosis not present

## 2021-01-29 MED ORDER — BUSPIRONE HCL 7.5 MG PO TABS: 7.5000 mg | ORAL_TABLET | Freq: Three times a day (TID) | ORAL | 0 refills | Status: DC

## 2021-01-29 NOTE — Assessment & Plan Note (Signed)
Last B12 was actually elevated so we had her stop her supplement she is doing well I had like to recheck that level in the summer in 6 months when very get updated blood work.

## 2021-01-29 NOTE — Assessment & Plan Note (Signed)
She says she is doing better in regards to her neck and spine.  It came down to either doing surgery or choosing not to.  She is opted not to at this point.  And is just really trying to be kind to herself.  And not overdoing it.

## 2021-01-29 NOTE — Assessment & Plan Note (Signed)
We discussed options. We can increase the dose to 7.5mg  form 5mg .  If she is doing well otherwise I will see her back in 6 months.  If she has any problems and she feels that we need to make an adjustment we can always do an in between appointment.  I think some of his just increased stress at work from the promotion and going back into the office.  We can always consider the addition of an SSRI as needed as well.

## 2021-01-29 NOTE — Progress Notes (Signed)
Pt is doing well on current regimen.  

## 2021-01-29 NOTE — Progress Notes (Signed)
Virtual Visit via Video Note  I connected with Melinda Parsons on 01/29/21 at  1:20 PM EST by a video enabled telemedicine application and verified that I am speaking with the correct person using two identifiers.   I discussed the limitations of evaluation and management by telemedicine and the availability of in person appointments. The patient expressed understanding and agreed to proceed.  Patient location: at home  Provider location: in office  Subjective:    CC: [place] up [place].  HPI: Melinda Parsons back into the office this week after working from home. She also got a promotion. She has had more work load.  She is on buspar 5mg .    She feels ike she may going into menopause.    Past medical history, Surgical history, Family history not pertinant except as noted below, Social history, Allergies, and medications have been entered into the medical record, reviewed, and corrections made.   Review of Systems: No fevers, chills, night sweats, weight loss, chest pain, or shortness of breath.   Objective:    General: Speaking clearly in complete sentences without any shortness of breath.  Alert and oriented x3.  Normal judgment. No apparent acute distress.    Impression and Recommendations:    Anxiety and depression We discussed options. We can increase the dose to 7.5mg  form 5mg .  If she is doing well otherwise I will see her back in 6 months.  If she has any problems and she feels that we need to make an adjustment we can always do an in between appointment.  I think some of his just increased stress at work from the promotion and going back into the office.  We can always consider the addition of an SSRI as needed as well.  Vitamin B12 deficiency Last B12 was actually elevated so we had her stop her supplement she is doing well I had like to recheck that level in the summer in 6 months when very get updated blood work.  Degenerative disc disease, cervical She says she is doing better in  regards to her neck and spine.  It came down to either doing surgery or choosing not to.  She is opted not to at this point.  And is just really trying to be kind to herself.  And not overdoing it.      Time spent in encounter 21 minutes  I discussed the assessment and treatment plan with the patient. The patient was provided an opportunity to ask questions and all were answered. The patient agreed with the plan and demonstrated an understanding of the instructions.   The patient was advised to call back or seek an in-person evaluation if the symptoms worsen or if the condition fails to improve as anticipated.   , MD

## 2021-02-13 ENCOUNTER — Other Ambulatory Visit: Payer: Self-pay | Admitting: Family Medicine

## 2021-02-13 DIAGNOSIS — F419 Anxiety disorder, unspecified: Secondary | ICD-10-CM

## 2021-02-13 DIAGNOSIS — F32A Depression, unspecified: Secondary | ICD-10-CM

## 2021-02-21 ENCOUNTER — Other Ambulatory Visit: Payer: Self-pay | Admitting: Family Medicine

## 2021-02-21 DIAGNOSIS — F419 Anxiety disorder, unspecified: Secondary | ICD-10-CM

## 2021-02-21 DIAGNOSIS — F32A Depression, unspecified: Secondary | ICD-10-CM

## 2021-04-19 ENCOUNTER — Telehealth: Payer: Self-pay | Admitting: *Deleted

## 2021-04-19 ENCOUNTER — Encounter: Payer: Self-pay | Admitting: Family Medicine

## 2021-04-19 NOTE — Telephone Encounter (Signed)
Pt called and LVM stating that her R eye had been twitching for 2-3 wks.   Pt had also sent a mychart I will reach out to her since I was not able to speak to her by phone.

## 2021-04-20 ENCOUNTER — Ambulatory Visit: Payer: No Typology Code available for payment source | Admitting: Physician Assistant

## 2021-04-20 ENCOUNTER — Other Ambulatory Visit: Payer: Self-pay

## 2021-04-20 ENCOUNTER — Encounter: Payer: Self-pay | Admitting: Physician Assistant

## 2021-04-20 VITALS — BP 128/88 | HR 90 | Temp 98.2°F | Resp 20 | Ht 68.9 in | Wt 120.0 lb

## 2021-04-20 DIAGNOSIS — G245 Blepharospasm: Secondary | ICD-10-CM | POA: Diagnosis not present

## 2021-04-20 DIAGNOSIS — F419 Anxiety disorder, unspecified: Secondary | ICD-10-CM

## 2021-04-20 DIAGNOSIS — G43109 Migraine with aura, not intractable, without status migrainosus: Secondary | ICD-10-CM

## 2021-04-20 DIAGNOSIS — H5711 Ocular pain, right eye: Secondary | ICD-10-CM | POA: Diagnosis not present

## 2021-04-20 DIAGNOSIS — F32A Depression, unspecified: Secondary | ICD-10-CM

## 2021-04-20 MED ORDER — BUSPIRONE HCL 15 MG PO TABS: 15.0000 mg | ORAL_TABLET | Freq: Three times a day (TID) | ORAL | 0 refills | Status: DC

## 2021-04-20 MED ORDER — TROKENDI XR 100 MG PO CP24: ORAL_CAPSULE | ORAL | 0 refills | Status: DC

## 2021-04-20 NOTE — Progress Notes (Signed)
L 

## 2021-04-20 NOTE — Patient Instructions (Signed)
Benign Essential Blepharospasm, Adult Benign essential blepharospasm (BEB) is a nervous system condition that makes a person close his or her eyes without meaning to. Over time, episodes of this condition may gradually become more frequent and forceful, and eventually involve both eyes. This can make it hard for you to keep your eyes open and do activities such as watching television, driving, or reading. If this condition is not treated, the eyes may close forcefully for long periods of time. What are the causes? The exact cause of this condition is not known. The condition may be passed down through families with an abnormal gene. Symptoms of the condition may be triggered by:  The wind.  Sunlight or bright lights.  Noise.  Stress.  Fatigue.  Reading.  Driving.  Walking outside.  Air pollution.  Eye strain. What increases the risk? You are more likely to develop this condition if:  You are age 53 or older.  You have a family history of the condition.  You are female.  There are problems with the part of your brain that controls movements (basal ganglia).  You have a movement disorder called dystonia.  You have a history of eye diseases or trauma to the eye(s).  You take certain medicines, such as medicines that treat Parkinson disease. What are the signs or symptoms? The first symptom of this condition is frequent eye blinking or twitching that you cannot control. It may happen during the day and disappear at night. Other early symptoms include:  Eye dryness and irritation.  Eye irritation or pain from bright lights (photophobia). You may get temporary relief from your symptoms when you sing, yawn, chew, or laugh. Later symptoms of this condition include:  Winking and squinting for longer than usual.  Muscle spasms in your tongue and jaw (Meigesyndrome).  Inability to keep your eyes open for long periods of time. Over time, symptoms may get stronger and last  longer. How is this diagnosed? This condition is diagnosed based on your symptoms, your medical history, and a physical exam. How is this treated? There is no cure for this condition, but treatment can help with symptoms. Treatment options include:  Applying moisturizing eye drops (artificial tears) to the eye. These drops help to relieve eye irritation and dryness.  Getting an injection of botulinum toxin into the muscles that control eyelid movement. This treatment may need to be repeated every few months.  Taking medicines such as muscle relaxants and anti-anxiety medicines.  Having surgery to remove part of the eyelid muscles (myectomy). This may be done if injections of botulinum toxin do not work or they stop working.   Follow these instructions at home: Lifestyle  Wear tinted sunglasses that block UV (ultraviolet) light.  Wear eye protection outdoors on windy days.  Get enough sleep.  Try to manage or avoid stressful situations.  Do not watch TV or have screen time for long periods of time.  Avoid things that trigger your condition. General instructions  Learn as much as you can about your condition.  Work closely with your team of health care providers.  Use over-the-counter and prescription medicines, including eye drops, only as told by your health care provider.  Keep all follow-up visits as told by your health care provider. This is important.  Do not drive if you are having an episode that affects how well you can see.  Keep your eyelids clean. Wash them daily with mild soap and water. This will help to prevent irritation and  infection. Contact a health care provider if:  Your symptoms are not controlled with treatment.  Your eyes are red, teary, or dry.  Your eyes droop.  You feel anxious or depressed. Get help right away if:  You cannot open your eyes. Summary  Benign essential blepharospasm (BEB) is a nervous system condition that makes a person  close his or her eyes without meaning to.  The exact cause of this condition is not known. The condition may be passed down through families with an abnormal gene.  There is no cure for this condition, but treatment can help with symptoms. This information is not intended to replace advice given to you by your health care provider. Make sure you discuss any questions you have with your health care provider. Document Revised: 08/14/2020 Document Reviewed: 08/14/2020 Elsevier Patient Education  2021 [employer].

## 2021-04-20 NOTE — Progress Notes (Signed)
Subjective:    Patient ID: Melinda Parsons, female    DOB: 02-16-1971, 50 y.o.   MRN: [id number]  HPI  Patient is a 50 year old female with history of anxiety, depression, complicated migraines who presents to the clinic with twitching of right eye and new onset pain.  For the last month she has been noticing her right eye intermittently twitching.  She thought it would just go away.  She has not really been doing anything to make better.  She recently did start decreasing her caffeine just a little.  Nothing has made any impact.  Over the last 2 days she has noticed some now twinges of sharp pains.  She does wear glasses no contacts.  She denies any vision changes.  She does have complicated migraines with neurological symptoms.  This had been increasing.  She is having about 4 a week.  She is on Trokendi for this.  She is under a lot of stress with work and home life.  She does request changing her BuSpar to 15 mg to make cheaper.  Her 7.5 mg tablet is more expensive at the pharmacy.  She denies any suicidal thoughts or homicidal idealizations.  She denies any eye itching.  She always feels like she has something stuck in it. No fever,chills, sinus pressure, ear pain or ST.     .. Active Ambulatory Problems    Diagnosis Date Noted  . Palpitations 11/30/2010  . Plantar wart of right foot 02/07/2014  . Bilateral plantar fascia fibromatosis 04/11/2014  . Left lumbar radiculitis 06/11/2014  . Fatigue 04/22/2015  . Complicated migraine 05/20/2015  . Degenerative disc disease, cervical 07/02/2015  . Vitamin B12 deficiency 09/08/2015  . Right arm pain 09/10/2015  . Carpal tunnel syndrome, left 10/05/2016  . Anxiety and depression 10/05/2016  . Aortic calcification (HCC) 01/02/2017  . Leg cramp 06/26/2019  . Canker sore 06/29/2020  . Acute right eye pain 04/21/2021  . Blepharospasm 04/21/2021   Resolved Ambulatory Problems    Diagnosis Date Noted  . BENIGN POSITIONAL VERTIGO 11/12/2010  .  HYPERTENSION, MILD 12/15/2009  . Pain of right great toe 05/28/2014  . Abnormal weight gain 07/02/2015  . Bloody diarrhea 07/23/2015  . B12 deficiency 09/10/2015  . Cough 10/05/2016  . Spinal stenosis in cervical region 07/07/2020   Past Medical History:  Diagnosis Date  . Anxiety   . GERD (gastroesophageal reflux disease)   . Hypertension   . IBS (irritable bowel syndrome)   . Lumbar herniated disc   . Migraine   . Pre-eclampsia   . PVC (premature ventricular contraction)   . Seasonal allergies        Review of Systems See HPI.     Objective:   Physical Exam Vitals reviewed.  Constitutional:      Appearance: Normal appearance.  HENT:     Head: Normocephalic.     Comments: No tenderness over temporal artery.     Right Ear: Tympanic membrane, ear canal and external ear normal. There is no impacted cerumen.     Left Ear: Tympanic membrane and external ear normal. There is no impacted cerumen.     Nose: Nose normal.     Mouth/Throat:     Mouth: Mucous membranes are moist.  Eyes:     General:        Right eye: No discharge.        Left eye: No discharge.     Extraocular Movements: Extraocular movements intact.     Conjunctiva/sclera:  Conjunctivae normal.     Pupils: Pupils are equal, round, and reactive to light.  Neck:     Vascular: No carotid bruit.  Cardiovascular:     Rate and Rhythm: Normal rate and regular rhythm.     Pulses: Normal pulses.     Heart sounds: Normal heart sounds.  Pulmonary:     Effort: Pulmonary effort is normal.     Breath sounds: Normal breath sounds.  Musculoskeletal:     Cervical back: No tenderness.  Lymphadenopathy:     Cervical: No cervical adenopathy.  Neurological:     General: No focal deficit present.     Mental Status: She is alert.  Psychiatric:        Mood and Affect: Mood normal.           Assessment & Plan:  Marland KitchenMarland KitchenBushra was seen today for eye problem.  Diagnoses and all orders for this  visit:  Blepharospasm  Acute right eye pain  Anxiety and depression -     busPIRone (BUSPAR) 15 MG tablet; Take 1 tablet (15 mg total) by mouth 3 (three) times daily.  Complicated migraine -     Topiramate ER (TROKENDI XR) 100 MG CP24; Take one tablet   Unclear etiology of breath or spasm.  I did give patient handout on all the potential causes.  No red flags of vision changes.  I did the floor stain and saw no corneal abrasions.  Do think anxiety could have something to do with this.  Went ahead and changed her BuSpar to 15 mg even though she wanted it more for the cost.  I discussed trying the 15 mg to replace the 7.5 mg and see if it helps more with her anxiety and stress.  I explained that for headaches a week is considered uncontrolled.  Certainly these eye symptoms could be a product of her migraines.  Increased Trokendi 200 mg daily.  Discussed there are many more things to do in the migraine department.  She does have an eye doctor and encouraged her to go ahead and make appointment.  I saw no external signs of conjunctivitis.  Encourage patient to start magnesium 250 mg at night with warm compresses.  Continue to decrease caffeine.  Gentle massage could also help.   Pt is on gabapentin which could help blephaspasm but it makes too sleepy to take during the day. Follow-up if any symptoms acutely worsening.  Spent 30 minutes with patient discussing plan, reviewing chart.

## 2021-04-21 ENCOUNTER — Encounter: Payer: Self-pay | Admitting: Physician Assistant

## 2021-04-21 DIAGNOSIS — H5711 Ocular pain, right eye: Secondary | ICD-10-CM | POA: Insufficient documentation

## 2021-04-21 DIAGNOSIS — G245 Blepharospasm: Secondary | ICD-10-CM | POA: Insufficient documentation

## 2021-07-05 ENCOUNTER — Encounter: Payer: Self-pay | Admitting: Family Medicine

## 2021-07-17 ENCOUNTER — Other Ambulatory Visit: Payer: Self-pay | Admitting: Physician Assistant

## 2021-07-17 DIAGNOSIS — G43109 Migraine with aura, not intractable, without status migrainosus: Secondary | ICD-10-CM

## 2021-07-17 DIAGNOSIS — F32A Depression, unspecified: Secondary | ICD-10-CM

## 2021-07-17 DIAGNOSIS — F419 Anxiety disorder, unspecified: Secondary | ICD-10-CM

## 2021-08-01 ENCOUNTER — Other Ambulatory Visit: Payer: Self-pay | Admitting: Sports Medicine

## 2021-08-01 DIAGNOSIS — M5030 Other cervical disc degeneration, unspecified cervical region: Secondary | ICD-10-CM

## 2021-08-18 ENCOUNTER — Encounter: Payer: Self-pay | Admitting: Medical-Surgical

## 2021-08-18 ENCOUNTER — Telehealth (INDEPENDENT_AMBULATORY_CARE_PROVIDER_SITE_OTHER): Payer: No Typology Code available for payment source | Admitting: Medical-Surgical

## 2021-08-18 DIAGNOSIS — U071 COVID-19: Secondary | ICD-10-CM

## 2021-08-18 MED ORDER — MOLNUPIRAVIR EUA 200MG CAPSULE: 4.0000 | ORAL_CAPSULE | Freq: Two times a day (BID) | ORAL | 0 refills | Status: AC

## 2021-08-18 MED ORDER — HYDROCODONE BIT-HOMATROP MBR 5-1.5 MG/5ML PO SOLN: 5.0000 mL | Freq: Three times a day (TID) | ORAL | 0 refills | Status: DC | PRN

## 2021-08-18 MED ORDER — IPRATROPIUM BROMIDE 0.03 % NA SOLN: 2.0000 | Freq: Two times a day (BID) | NASAL | 0 refills | Status: DC

## 2021-08-18 NOTE — Progress Notes (Signed)
Virtual Visit via Video Note  I connected with Melinda Parsons on 08/18/21 at  4:00 PM EDT by a video enabled telemedicine application and verified that I am speaking with the correct person using two identifiers.   I discussed the limitations of evaluation and management by telemedicine and the availability of in person appointments. The patient expressed understanding and agreed to proceed.  Patient location: home Provider locations: office  Subjective:    CC: COVID-19 positive  HPI: Pleasant 50 year old female presenting today via MyChart video visit with reports of testing positive for COVID.  She recently went on a trip to the mountains and noted on Monday that she had a sore throat and sinus congestion started.  She did test on Monday with negative results.  Her symptoms worsened yesterday and she began to run a low-grade fever.  She tested again with negative results.  Today, she awoke early this morning with a fever of 102, body aches, headaches, ear pressure/pain, cough, and severe sinus congestion.  Her test this morning was positive for COVID-19.  She is using ibuprofen as needed for fevers but no other medications to treat her symptoms at this point.  She does have some chest tightness and soreness from coughing.  She endorses a small amount of shortness of breath, likely related to congestion.  No red flag symptoms today.  Able to eat and drink without difficulty although appetite is poor.  Past medical history, Surgical history, Family history not pertinant except as noted below, Social history, Allergies, and medications have been entered into the medical record, reviewed, and corrections made.   Review of Systems: See HPI for pertinent positives and negatives.   Objective:    General: Speaking clearly in complete sentences without any shortness of breath.  Alert and oriented x3.  Normal judgment. No apparent acute distress.  Impression and Recommendations:    1. COVID-19 virus  infection Discussed recommended treatment for symptoms of COVID-19.  Reviewed over-the-counter options for symptom management.  Discussed emergency use authorization of Paxlovid and Molnupiravir.  She is open to trying 1 of these so sending in Molnupiravir 800 mg twice daily x5 days.  Continue to use Tylenol and ibuprofen okay to use over-the-counter decongestants and cold medications.  Sending in [place] for significant cough.  Atrovent twice daily as needed for rhinorrhea and nasal congestion.  Aware to seek emergency care for high fever that does not respond to Tylenol or ibuprofen, worsening shortness of breath, chest pain, or changes in mental status.  I discussed the assessment and treatment plan with the patient. The patient was provided an opportunity to ask questions and all were answered. The patient agreed with the plan and demonstrated an understanding of the instructions.   The patient was advised to call back or seek an in-person evaluation if the symptoms worsen or if the condition fails to improve as anticipated.  20 minutes of non-face-to-face time was provided during this encounter.  Return if symptoms worsen or fail to improve.  Thayer Ohm, DNP, APRN, FNP-BC [facility]

## 2021-08-18 NOTE — Progress Notes (Signed)
Covid positive today. Cough, fever, congestion, sore throat, ear pain, body aches.

## 2021-09-10 ENCOUNTER — Other Ambulatory Visit: Payer: Self-pay | Admitting: Medical-Surgical

## 2021-09-26 ENCOUNTER — Other Ambulatory Visit: Payer: Self-pay | Admitting: Medical-Surgical

## 2021-09-27 NOTE — Telephone Encounter (Signed)
[place] pt 

## 2021-10-10 ENCOUNTER — Other Ambulatory Visit: Payer: Self-pay | Admitting: Physician Assistant

## 2021-10-10 DIAGNOSIS — F32A Depression, unspecified: Secondary | ICD-10-CM

## 2021-10-10 DIAGNOSIS — F419 Anxiety disorder, unspecified: Secondary | ICD-10-CM

## 2021-10-14 ENCOUNTER — Other Ambulatory Visit: Payer: Self-pay | Admitting: Physician Assistant

## 2021-10-14 DIAGNOSIS — G43109 Migraine with aura, not intractable, without status migrainosus: Secondary | ICD-10-CM

## 2021-12-13 ENCOUNTER — Other Ambulatory Visit: Payer: Self-pay | Admitting: Physician Assistant

## 2021-12-13 DIAGNOSIS — F32A Depression, unspecified: Secondary | ICD-10-CM

## 2021-12-13 DIAGNOSIS — F419 Anxiety disorder, unspecified: Secondary | ICD-10-CM

## 2021-12-14 NOTE — Telephone Encounter (Signed)
LVM for patient to call back to get this f/u appt scheduled. AM 

## 2021-12-14 NOTE — Telephone Encounter (Signed)
Please call pt and have her schedule a f/u for refill of Buspar. 30 day supply sent.

## 2021-12-27 ENCOUNTER — Other Ambulatory Visit: Payer: Self-pay

## 2021-12-27 ENCOUNTER — Encounter: Payer: Self-pay | Admitting: Family Medicine

## 2021-12-27 ENCOUNTER — Encounter: Payer: No Typology Code available for payment source | Admitting: Family Medicine

## 2021-12-27 ENCOUNTER — Ambulatory Visit (INDEPENDENT_AMBULATORY_CARE_PROVIDER_SITE_OTHER): Payer: No Typology Code available for payment source | Admitting: Family Medicine

## 2021-12-27 VITALS — BP 111/69 | HR 92 | Ht 64.0 in | Wt 121.0 lb

## 2021-12-27 DIAGNOSIS — F32A Depression, unspecified: Secondary | ICD-10-CM

## 2021-12-27 DIAGNOSIS — F419 Anxiety disorder, unspecified: Secondary | ICD-10-CM

## 2021-12-27 DIAGNOSIS — Z0000 Encounter for general adult medical examination without abnormal findings: Secondary | ICD-10-CM

## 2021-12-27 DIAGNOSIS — G43109 Migraine with aura, not intractable, without status migrainosus: Secondary | ICD-10-CM

## 2021-12-27 LAB — LIPID PANEL W/REFLEX DIRECT LDL
Cholesterol: 149 mg/dL (ref <200)
HDL: 71 mg/dL (ref >=50)
LDL Cholesterol (Calc): 63 mg/dL (calc)
Non-HDL Cholesterol (Calc): 78 mg/dL (calc) (ref <130)
Total CHOL/HDL Ratio: 2.1 (calc) (ref <5.0)
Triglycerides: 74 mg/dL (ref <150)

## 2021-12-27 LAB — CBC
HCT: 43.9 % (ref 35.0–45.0)
Hemoglobin: 14.6 g/dL (ref 11.7–15.5)
MCH: 31.9 pg (ref 27.0–33.0)
MCHC: 33.3 g/dL (ref 32.0–36.0)
MCV: 95.9 fL (ref 80.0–100.0)
MPV: 10.2 fL (ref 7.5–12.5)
Platelets: 261 10*3/uL (ref 140–400)
RBC: 4.58 10*6/uL (ref 3.80–5.10)
RDW: 11.5 % (ref 11.0–15.0)
WBC: 7 10*3/uL (ref 3.8–10.8)

## 2021-12-27 MED ORDER — BUSPIRONE HCL 15 MG PO TABS: 15.0000 mg | ORAL_TABLET | Freq: Three times a day (TID) | ORAL | 1 refills | Status: DC

## 2021-12-27 MED ORDER — TROKENDI XR 100 MG PO CP24: 100.0000 mg | ORAL_CAPSULE | Freq: Every day | ORAL | 1 refills | Status: DC

## 2021-12-27 NOTE — Progress Notes (Addendum)
Subjective:     Melinda Parsons is a 51 y.o. female and is here for a comprehensive physical exam. The patient reports no problems.  Social History   Socioeconomic History   Marital status: Married    Spouse name: Not on file   Number of children: 1   Years of education: 16   Highest education level: Not on file  Occupational History   Occupation: Administrator, sports.     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  Other Topics Concern   Not on file  Social History Narrative   Lives at home with her husband and daughter.   Left-handed.   4-6 cups caffeine per day.   Social Determinants of Health   Financial Resource Strain: Not on file  Food Insecurity: Not on file  Transportation Needs: Not on file  Physical Activity: Not on file  Stress: Not on file  Social Connections: Not on file  Intimate Partner Violence: Not on file   Health Maintenance  Topic Date Due   COVID-19 Vaccine (4 - Booster for [place] series) 01/23/2021   MAMMOGRAM  09/22/2022   PAP SMEAR-Modifier  10/24/2024   TETANUS/TDAP  10/28/2024   COLONOSCOPY (Pts 45-56yrs Insurance coverage will need to be confirmed)  10/07/2029   INFLUENZA VACCINE  Completed   Hepatitis C Screening  Completed   HIV Screening  Completed   Pneumococcal Vaccine 73-70 Years old  Aged Out   HPV VACCINES  Aged Out    The following portions of the patient's history were reviewed and updated as appropriate: allergies, current medications, past family history, past medical history, past social history, past surgical history, and problem  list.  Review of Systems A comprehensive review of systems was negative.   Objective:    BP 111/69    Pulse 92    Ht 5\' 4"  (1.626 m)    Wt 121 lb (54.9 kg)    SpO2 100%    BMI 20.77 kg/m  General appearance: alert, cooperative, and appears stated age Head: Normocephalic, without obvious abnormality, atraumatic Eyes:  conj clear, EOMI, PEERLA Ears: normal TM's and external ear canals both ears Nose: Nares normal. Septum midline. Mucosa normal. No drainage or sinus tenderness. Throat: lips, mucosa, and tongue normal; teeth and gums normal Neck: no adenopathy, no carotid bruit, no JVD, supple, symmetrical, trachea midline, and thyroid not enlarged, symmetric, no tenderness/mass/nodules Back: symmetric, no curvature. ROM normal. No CVA tenderness. Lungs: clear to auscultation bilaterally Heart: regular rate and rhythm, S1, S2 normal, no murmur, click, rub or gallop Abdomen: soft, non-tender; bowel sounds normal; no masses,  no organomegaly Extremities: extremities normal, atraumatic, no cyanosis or edema Pulses: 2+ and symmetric Skin: Skin color, texture, turgor normal. No rashes or lesions Lymph nodes: Cervical adenopathy: nl and Supraclavicular adenopathy: nl Neurologic: Grossly normal    Assessment:    Healthy female exam.      Plan:     See After Visit Summary for Counseling Recommendations  Keep up a regular exercise program and  make sure you are eating a healthy diet Try to eat 4 servings of dairy a day, or if you are lactose intolerant take a calcium with vitamin D daily.  Your vaccines are up to date.

## 2021-12-28 NOTE — Progress Notes (Signed)
Hi Denise, cholesterol and blood count look good.

## 2022-01-09 ENCOUNTER — Other Ambulatory Visit: Payer: Self-pay | Admitting: Family Medicine

## 2022-01-09 DIAGNOSIS — F419 Anxiety disorder, unspecified: Secondary | ICD-10-CM

## 2022-01-14 ENCOUNTER — Encounter: Payer: Self-pay | Admitting: Family Medicine

## 2022-01-14 DIAGNOSIS — F32A Depression, unspecified: Secondary | ICD-10-CM

## 2022-01-14 DIAGNOSIS — F419 Anxiety disorder, unspecified: Secondary | ICD-10-CM

## 2022-01-17 MED ORDER — BUSPIRONE HCL 30 MG PO TABS: 30.0000 mg | ORAL_TABLET | Freq: Three times a day (TID) | ORAL | 1 refills | Status: DC

## 2022-01-26 ENCOUNTER — Telehealth: Payer: No Typology Code available for payment source | Admitting: Family

## 2022-01-26 ENCOUNTER — Emergency Department
Admission: EM | Admit: 2022-01-26 | Discharge: 2022-01-26 | Disposition: A | Discharge status: Home / Self Care | Payer: No Typology Code available for payment source | Source: Home / Self Care

## 2022-01-26 ENCOUNTER — Encounter: Payer: Self-pay | Admitting: Emergency Medicine

## 2022-01-26 ENCOUNTER — Other Ambulatory Visit: Payer: Self-pay

## 2022-01-26 DIAGNOSIS — J029 Acute pharyngitis, unspecified: Secondary | ICD-10-CM | POA: Diagnosis not present

## 2022-01-26 DIAGNOSIS — R0602 Shortness of breath: Secondary | ICD-10-CM

## 2022-01-26 DIAGNOSIS — R6889 Other general symptoms and signs: Secondary | ICD-10-CM

## 2022-01-26 DIAGNOSIS — J101 Influenza due to other identified influenza virus with other respiratory manifestations: Secondary | ICD-10-CM

## 2022-01-26 DIAGNOSIS — R059 Cough, unspecified: Secondary | ICD-10-CM

## 2022-01-26 LAB — POCT RAPID STREP A (OFFICE): Rapid Strep A Screen: NEGATIVE

## 2022-01-26 LAB — POCT INFLUENZA A/B
Influenza A, POC: NEGATIVE
Influenza B, POC: POSITIVE — AB

## 2022-01-26 MED ORDER — PROMETHAZINE-DM 6.25-15 MG/5ML PO SYRP: 5.0000 mL | ORAL_SOLUTION | Freq: Two times a day (BID) | ORAL | 0 refills | Status: DC | PRN

## 2022-01-26 MED ORDER — BENZONATATE 200 MG PO CAPS: 200.0000 mg | ORAL_CAPSULE | Freq: Three times a day (TID) | ORAL | 0 refills | Status: AC | PRN

## 2022-01-26 MED ORDER — OSELTAMIVIR PHOSPHATE 75 MG PO CAPS: 75.0000 mg | ORAL_CAPSULE | Freq: Two times a day (BID) | ORAL | 0 refills | Status: DC

## 2022-01-26 MED ORDER — PREDNISONE 50 MG PO TABS: ORAL_TABLET | ORAL | 0 refills | Status: DC

## 2022-01-26 NOTE — Progress Notes (Signed)
Based on what you shared with me, I feel your condition warrants further evaluation and I recommend that you be seen in a face to face visit.   NOTE: There will be NO CHARGE for this eVisit  Given your shortness of breath, you need to be seen in person to be evaluated to rule out a more serious infection such pneumonia.    If you are having a true medical emergency please call 911.      For an urgent face to face visit, [facility] has six urgent care centers for your convenience:     [facility] Health Urgent Care Center at [facility] Directions [phone] [address] [place], [place] [address]    [facility] Health Urgent Care Center [facility]) Get Driving Directions [phone] [address] [place], [place] [address]  [facility] Health Urgent Care Center [facility]) Get Driving Directions [phone] [address] [place],  [place]  [address]  [facility] Health Urgent Care at [facility] Get Driving Directions [phone] [address] [place], [place] [address]   [facility] Health Urgent Care at [facility] Get Driving Directions  [phone] [address] [place], [place] [address]   [facility] Health Urgent Care at [facility] Directions [phone] [address] [place], [place] [address]  Your MyChart E-visit questionnaire answers were reviewed by a board certified advanced clinical practitioner to complete your personal care plan based on your specific symptoms.  Thank you for using e-Visits.

## 2022-01-26 NOTE — Discharge Instructions (Addendum)
Advised patient to take medication as directed with food to completion.  Advised patient to take Prednisone with first dose of Tamiflu for the next 5 of 10 days.  Advised patient may use Tessalon Perles daily or as needed for cough.  Advised patient may use Promethazine DM in late afternoon early evening or prior to sleep for cough due to sedative effects.  Advised patient not to take Tessalon Perles and Promethazine DM together.  Encouraged patient to increase daily water intake while taking these medications.

## 2022-01-26 NOTE — ED Triage Notes (Signed)
Sore throat since Sunday  Fatigue the last few days OTC ibuprofen this am  (400mg )  at 0730 [place] yesterday w/ back pain  Temp 99 range  Negative COVID test at home this am E visit this am - was told to go to Urgent Care  COVID  9/22

## 2022-01-26 NOTE — ED Provider Notes (Signed)
[facility]    CSN: [id number] Arrival date & time: 01/26/22  1150      History   Chief Complaint Chief Complaint  Patient presents with   Sore Throat    HPI Melinda Parsons is a 51 y.o. female.   HPI 51 year old female presents with sore throat, fever, shortness of breath, and body aches for 3 days.  Patient had E-visit with PCP today who recommended face-to-face visit for evaluation of current symptoms.  PMH significant for HTN, PVCs, and migraine.  Past Medical History:  Diagnosis Date   Anxiety    GERD (gastroesophageal reflux disease)    Hypertension    IBS (irritable bowel syndrome)    Lumbar herniated disc    Migraine    Pre-eclampsia    with daughter   PVC (premature ventricular contraction)    Previous holter in Dec 2011 showed PVCs and 2 3 beat runs of SVT   Seasonal allergies     Patient Active Problem List   Diagnosis Date Noted   Acute right eye pain 04/21/2021   Blepharospasm 04/21/2021   Canker sore 06/29/2020   Leg cramp 06/26/2019   Aortic calcification (HCC) 01/02/2017   Carpal tunnel syndrome, left 10/05/2016   Anxiety and depression 10/05/2016   Right arm pain 09/10/2015   Vitamin B12 deficiency 09/08/2015   Degenerative disc disease, cervical 07/02/2015   Complicated migraine 05/20/2015   Fatigue 04/22/2015   Left lumbar radiculitis 06/11/2014   Bilateral plantar fascia fibromatosis 04/11/2014   Plantar wart of right foot 02/07/2014   Palpitations 11/30/2010    Past Surgical History:  Procedure Laterality Date   NO PAST SURGERIES      OB History   No obstetric history on file.      Home Medications    Prior to Admission medications   Medication Sig Start Date End Date Taking? Authorizing Provider  benzonatate (TESSALON) 200 MG capsule Take 1 capsule (200 mg total) by mouth 3 (three) times daily as needed for up to 7 days for cough. 01/26/22 02/02/22 Yes Trevor Iha, FNP  oseltamivir (TAMIFLU) 75 MG capsule Take 1  capsule (75 mg total) by mouth every 12 (twelve) hours. 01/26/22  Yes Trevor Iha, FNP  predniSONE (DELTASONE) 50 MG tablet Take 1 tab p.o. daily for 5 days. 01/26/22  Yes Trevor Iha, FNP  promethazine-dextromethorphan (PROMETHAZINE-DM) 6.25-15 MG/5ML syrup Take 5 mLs by mouth 2 (two) times daily as needed for cough. 01/26/22  Yes Trevor Iha, FNP  busPIRone (BUSPAR) 30 MG tablet Take 1 tablet (30 mg total) by mouth 3 (three) times daily. 01/17/22   Agapito Games, MD  fexofenadine (ALLEGRA) 180 MG tablet Take 180 mg by mouth daily.    [provider]  fluticasone (FLONASE) 50 MCG/ACT nasal spray INSTILL 1 SPRAY INTO EACH NOSTRIL TWICE A DAY. USE LEFT HAND FOR RIGHT NOSTRIL, RIGHT HAND FOR LEFT 04/18/19   Rodolph Bong, MD  gabapentin (NEURONTIN) 600 MG tablet TAKE 1 TABLET BY MOUTH AT BEDTIME 08/01/21   Monica Becton, MD  hyoscyamine (LEVBID) 0.375 MG 12 hr tablet Take 0.375 mg by mouth 2 (two) times daily. She is taking 1 tablet daily    [provider]  NUVARING 0.12-0.015 MG/24HR vaginal ring INSERT ONE RING VAGINALLY ONCE A MONTH AS DIRECTED BY PHYSICIAN 11/15/17   [provider]  Topiramate ER (TROKENDI XR) 100 MG CP24 Take 100 mg by mouth daily. 12/27/21   Agapito Games, MD  tretinoin (RETIN-A) 0.025 %  cream SMARTSIG:Sparingly Topical Every Night 01/08/22   [provider]    Family History Family History  Problem Relation Age of Onset   Hypertension Mother    Hyperlipidemia Mother    Breast cancer Mother 82   Diverticulitis Father    Hypertension Other        grandmother   Alcohol abuse Other        grandparent    Social History Social History   Tobacco Use   Smoking status: Never   Smokeless tobacco: Never  Vaping Use   Vaping Use: Never used  Substance Use Topics   Alcohol use: Yes    Alcohol/week: 1.0 - 2.0 standard drink    Types: 1 - 2 Standard drinks or equivalent per week    Comment: Socially   Drug use:  No     Allergies   Patient has no known allergies.   Review of Systems Review of Systems  Constitutional:  Positive for fever.  HENT:  Positive for sore throat.   Respiratory:  Positive for shortness of breath.   Musculoskeletal:  Positive for myalgias.  All other systems reviewed and are negative.   Physical Exam Triage Vital Signs ED Triage Vitals  Enc Vitals Group     BP      Pulse      Resp      Temp      Temp src      SpO2      Weight      Height      Head Circumference      Peak Flow      Pain Score      Pain Loc      Pain Edu?      Excl. in GC?    No data found.  Updated Vital Signs BP 119/82 (BP Location: Right Arm)    Pulse 92    Temp 98.8 F (37.1 C) (Oral)    Resp 15    Ht 5\' 4"  (1.626 m)    Wt 120 lb (54.4 kg)    LMP 01/10/2022 (Approximate)    SpO2 100%    BMI 20.60 kg/m     Physical Exam Vitals and nursing note reviewed.  Constitutional:      General: She is not in acute distress.    Appearance: Normal appearance. She is well-developed and normal weight. She is ill-appearing.  HENT:     Head: Normocephalic and atraumatic.     Right Ear: Tympanic membrane, ear canal and external ear normal.     Left Ear: Tympanic membrane, ear canal and external ear normal.     Nose: Nose normal.     Mouth/Throat:     Mouth: Mucous membranes are moist.     Pharynx: Oropharynx is clear. Uvula midline. Posterior oropharyngeal erythema present. No uvula swelling.  Eyes:     Extraocular Movements: Extraocular movements intact.     Conjunctiva/sclera: Conjunctivae normal.     Pupils: Pupils are equal, round, and reactive to light.  Cardiovascular:     Rate and Rhythm: Normal rate and regular rhythm.     Pulses: Normal pulses.     Heart sounds: Normal heart sounds.  Pulmonary:     Effort: Pulmonary effort is normal.     Breath sounds: Normal breath sounds. No wheezing, rhonchi or rales.     Comments: Infrequent nonproductive cough noted on  exam Musculoskeletal:     Cervical back: Normal range of motion and neck supple.  Tenderness present.  Lymphadenopathy:     Cervical: Cervical adenopathy present.  Skin:    General: Skin is warm and dry.  Neurological:     General: No focal deficit present.     Mental Status: She is alert and oriented to person, place, and time.     UC Treatments / Results  Labs (all labs ordered are listed, but only abnormal results are displayed) Labs Reviewed  POCT INFLUENZA A/B - Abnormal; Notable for the following components:      Result Value   Influenza B, POC Positive (*)    All other components within normal limits  CULTURE, GROUP A STREP  POCT RAPID STREP A (OFFICE)    EKG   Radiology No results found.  Procedures Procedures (including critical care time)  Medications Ordered in UC Medications - No data to display  Initial Impression / Assessment and Plan / UC Course  I have reviewed the triage vital signs and the nursing notes.  Pertinent labs & imaging results that were available during my care of the patient were reviewed by me and considered in my medical decision making (see chart for details).     MDM: 1.  Influenza B-Rx'd Tamiflu; 2.  Cough-Rx'd Prednisone, Tessalon Perles, and Promethazine DM. Advised patient to take medication as directed with food to completion.  Advised patient to take Prednisone with first dose of Tamiflu for the next 5 of 10 days.  Advised patient may use Tessalon Perles daily or as needed for cough.  Advised patient may use Promethazine DM in late afternoon early evening or prior to sleep for cough due to sedative effects.  Advised patient not to take Tessalon Perles and Promethazine DM together.  Encouraged patient to increase daily water intake while taking these medications.  Work note provided prior to discharge.  Patient discharged home, hemodynamically stable. Final Clinical Impressions(s) / UC Diagnoses   Final diagnoses:  Sore throat   Influenza B  Cough, unspecified type     Discharge Instructions      Advised patient to take medication as directed with food to completion.  Advised patient to take Prednisone with first dose of Tamiflu for the next 5 of 10 days.  Advised patient may use Tessalon Perles daily or as needed for cough.  Advised patient may use Promethazine DM in late afternoon early evening or prior to sleep for cough due to sedative effects.  Advised patient not to take Tessalon Perles and Promethazine DM together.  Encouraged patient to increase daily water intake while taking these medications.     ED Prescriptions     Medication Sig Dispense Auth. Provider   oseltamivir (TAMIFLU) 75 MG capsule Take 1 capsule (75 mg total) by mouth every 12 (twelve) hours. 10 capsule Trevor Iha, FNP   predniSONE (DELTASONE) 50 MG tablet Take 1 tab p.o. daily for 5 days. 5 tablet Trevor Iha, FNP   benzonatate (TESSALON) 200 MG capsule Take 1 capsule (200 mg total) by mouth 3 (three) times daily as needed for up to 7 days for cough. 40 capsule Trevor Iha, FNP   promethazine-dextromethorphan (PROMETHAZINE-DM) 6.25-15 MG/5ML syrup Take 5 mLs by mouth 2 (two) times daily as needed for cough. 118 mL Trevor Iha, FNP      PDMP not reviewed this encounter.   Trevor Iha, FNP 01/26/22 1313

## 2022-01-29 LAB — CULTURE, GROUP A STREP: Strep A Culture: NEGATIVE

## 2022-02-05 ENCOUNTER — Telehealth: Payer: No Typology Code available for payment source | Admitting: Nurse Practitioner

## 2022-02-05 DIAGNOSIS — R051 Acute cough: Secondary | ICD-10-CM | POA: Diagnosis not present

## 2022-02-05 DIAGNOSIS — J0140 Acute pansinusitis, unspecified: Secondary | ICD-10-CM

## 2022-02-05 MED ORDER — ALBUTEROL SULFATE HFA 108 (90 BASE) MCG/ACT IN AERS: 2.0000 | INHALATION_SPRAY | Freq: Four times a day (QID) | RESPIRATORY_TRACT | 0 refills | Status: DC | PRN

## 2022-02-05 MED ORDER — AMOXICILLIN-POT CLAVULANATE 875-125 MG PO TABS: 1.0000 | ORAL_TABLET | Freq: Two times a day (BID) | ORAL | 0 refills | Status: AC

## 2022-02-05 MED ORDER — BENZONATATE 100 MG PO CAPS: 100.0000 mg | ORAL_CAPSULE | Freq: Three times a day (TID) | ORAL | 0 refills | Status: DC | PRN

## 2022-02-05 NOTE — Progress Notes (Signed)
E-Visit for Sinus Problems  We are sorry that you are not feeling well.  Here is how we plan to help!  Based on what you have shared with me it looks like you have sinusitis.  Sinusitis is inflammation and infection in the sinus cavities of the head.  Based on your presentation I believe you most likely have Acute Bacterial Sinusitis.  This is an infection caused by bacteria and is treated with antibiotics. I have prescribed Augmentin 875mg /125mg  one tablet twice daily with food, for 7 days. You may use an oral decongestant such as Mucinex D or if you have glaucoma or high blood pressure use plain Mucinex. Saline nasal spray help and can safely be used as often as needed for congestion.  If you develop worsening sinus pain, fever or notice severe headache and vision changes, or if symptoms are not better after completion of antibiotic, please schedule an appointment with a health care provider.    We will also call in an inhaler and cough medication for you as well.  Meds ordered this encounter  Medications   amoxicillin-clavulanate (AUGMENTIN) 875-125 MG tablet    Sig: Take 1 tablet by mouth 2 (two) times daily for 7 days. Take with food    Dispense:  14 tablet    Refill:  0   benzonatate (TESSALON) 100 MG capsule    Sig: Take 1 capsule (100 mg total) by mouth 3 (three) times daily as needed for cough.    Dispense:  30 capsule    Refill:  0   albuterol (VENTOLIN HFA) 108 (90 Base) MCG/ACT inhaler    Sig: Inhale 2 puffs into the lungs every 6 (six) hours as needed for wheezing or shortness of breath.    Dispense:  8 g    Refill:  0     Sinus infections are not as easily transmitted as other respiratory infection, however we still recommend that you avoid close contact with loved ones, especially the very young and elderly.  Remember to wash your hands thoroughly throughout the day as this is the number one way to prevent the spread of infection!  Home Care: Only take medications as  instructed by your medical team. Complete the entire course of an antibiotic. Do not take these medications with alcohol. A steam or ultrasonic humidifier can help congestion.  You can place a towel over your head and breathe in the steam from hot water coming from a faucet. Avoid close contacts especially the very young and the elderly. Cover your mouth when you cough or sneeze. Always remember to wash your hands.  Get Help Right Away If: You develop worsening fever or sinus pain. You develop a severe head ache or visual changes. Your symptoms persist after you have completed your treatment plan.  Make sure you Understand these instructions. Will watch your condition. Will get help right away if you are not doing well or get worse.  Thank you for choosing an e-visit.  Your e-visit answers were reviewed by a board certified advanced clinical practitioner to complete your personal care plan. Depending upon the condition, your plan could have included both over the counter or prescription medications.  Please review your pharmacy choice. Make sure the pharmacy is open so you can pick up prescription now. If there is a problem, you may contact your provider through [employer] and have the prescription routed to another pharmacy.  Your safety is important to [place]. If you have drug allergies check your prescription  carefully.   For the next 24 hours you can use MyChart to ask questions about today's visit, request a non-urgent call back, or ask for a work or school excuse. You will get an email in the next two days asking about your experience. I hope that your e-visit has been valuable and will speed your recovery.   I spent approximately 7 minutes reviewing the patient's history, current symptoms and coordinating their plan of care today.

## 2022-02-13 ENCOUNTER — Other Ambulatory Visit: Payer: Self-pay | Admitting: Family Medicine

## 2022-02-13 DIAGNOSIS — F419 Anxiety disorder, unspecified: Secondary | ICD-10-CM

## 2022-02-27 ENCOUNTER — Encounter: Payer: Self-pay | Admitting: Family Medicine

## 2022-03-01 ENCOUNTER — Other Ambulatory Visit: Payer: Self-pay

## 2022-03-01 ENCOUNTER — Ambulatory Visit: Payer: No Typology Code available for payment source | Admitting: Family Medicine

## 2022-03-01 ENCOUNTER — Encounter: Payer: Self-pay | Admitting: Family Medicine

## 2022-03-01 VITALS — BP 122/87 | HR 81 | Resp 16 | Ht 64.0 in | Wt 122.0 lb

## 2022-03-01 DIAGNOSIS — L659 Nonscarring hair loss, unspecified: Secondary | ICD-10-CM

## 2022-03-01 DIAGNOSIS — L650 Telogen effluvium: Secondary | ICD-10-CM | POA: Insufficient documentation

## 2022-03-01 NOTE — Assessment & Plan Note (Signed)
Discussed common causes of general hair loss.  Will check for deficiencies. Will call with results. Discussed healthy diet and exercise.  Start Women's Rogaine.  F/U in 3-4 months.   ?

## 2022-03-01 NOTE — Progress Notes (Signed)
? ?Acute Office Visit ? ?Subjective:  ? ? Patient ID: Melinda Parsons, female    DOB: [birth year], 51 y.o.   MRN: [id number] ? ?Chief Complaint  ?Patient presents with  ? Alopecia  ?  Since having Covid in 08/2021.   ? Anxiety  ?  Several months   ? ? ?HPI ?Patient is in today for alopecia since having had COVID in Sept 2022. Says started a couple of months later.  Says it was a small increase at first but then this Saturday noticed a big chunk of hair. She hasn't had any medication changes.  She has also been under a lot of emotional stress.  Her husband has had 2 mini strokes and his job is in jeopardy.  Her daughter is really struggling emotionally and is in school and has 2 part time jobs.   ? ? ?Past Medical History:  ?Diagnosis Date  ? Anxiety   ? GERD (gastroesophageal reflux disease)   ? Hypertension   ? IBS (irritable bowel syndrome)   ? Lumbar herniated disc   ? Migraine   ? Pre-eclampsia   ? with daughter  ? PVC (premature ventricular contraction)   ? Previous holter in Dec 2011 showed PVCs and 2 3 beat runs of SVT  ? Seasonal allergies   ? ? ?Past Surgical History:  ?Procedure Laterality Date  ? NO PAST SURGERIES    ? ? ?Family History  ?Problem Relation Age of Onset  ? Hypertension Mother   ? Hyperlipidemia Mother   ? Breast cancer Mother 62  ? Diverticulitis Father   ? Hypertension Other   ?     grandmother  ? Alcohol abuse Other   ?     grandparent  ? ? ?Social History  ? ?Socioeconomic History  ? Marital status: Married  ?  Spouse name: Not on file  ? Number of children: 1  ? Years of education: 13  ? Highest education level: Not on file  ?Occupational History  ? Occupation: Administrator, sports.   ?  Employer: [employer]  ?  Comment: consultant  ?Tobacco Use  ? Smoking status: Never  ? Smokeless tobacco: Never  ?Vaping Use  ? Vaping Use: Never used  ?Substance and Sexual Activity  ? Alcohol use: Yes  ?  Alcohol/week: 1.0 - 2.0 standard drink  ?  Types: 1 - 2 Standard drinks or  equivalent per week  ?  Comment: Socially  ? Drug use: No  ? Sexual activity: Not on file  ?  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  ?Other Topics Concern  ? Not on file  ?Social History Narrative  ? Lives at home with her husband and daughter.  ? Left-handed.  ? 4-6 cups caffeine per day.  ? ?Social Determinants of Health  ? ?Financial Resource Strain: Not on file  ?Food Insecurity: Not on file  ?Transportation Needs: Not on file  ?Physical Activity: Not on file  ?Stress: Not on file  ?Social Connections: Not on file  ?Intimate Partner Violence: Not on file  ? ? ?Outpatient Medications Prior to Visit  ?Medication Sig Dispense Refill  ? busPIRone (BUSPAR) 30 MG tablet TAKE 1 TABLET BY MOUTH 3 TIMES DAILY. (Patient taking differently: 15 mg. Patient takes 15 mg three times a day) 270 tablet 0  ? fexofenadine (ALLEGRA) 180 MG tablet Take 180 mg by mouth daily.    ? fluticasone (FLONASE) 50 MCG/ACT nasal spray  INSTILL 1 SPRAY INTO EACH NOSTRIL TWICE A DAY. USE LEFT HAND FOR RIGHT NOSTRIL, RIGHT HAND FOR LEFT 48 g 2  ? gabapentin (NEURONTIN) 600 MG tablet TAKE 1 TABLET BY MOUTH AT BEDTIME 90 tablet 3  ? hyoscyamine (LEVBID) 0.375 MG 12 hr tablet Take 0.375 mg by mouth 2 (two) times daily. She is taking 1 tablet daily    ? NUVARING 0.12-0.015 MG/24HR vaginal ring INSERT ONE RING VAGINALLY ONCE A MONTH AS DIRECTED BY PHYSICIAN  3  ? Topiramate ER (TROKENDI XR) 100 MG CP24 Take 100 mg by mouth daily. 90 capsule 1  ? tretinoin (RETIN-A) 0.025 % cream SMARTSIG:Sparingly Topical Every Night    ? albuterol (VENTOLIN HFA) 108 (90 Base) MCG/ACT inhaler Inhale 2 puffs into the lungs every 6 (six) hours as needed for wheezing or shortness of breath. 8 g 0  ? benzonatate (TESSALON) 100 MG capsule Take 1 capsule (100 mg total) by mouth 3 (three) times daily as needed for cough. 30 capsule 0  ? oseltamivir (TAMIFLU) 75 MG capsule Take 1 capsule (75 mg total) by mouth every 12 (twelve)  hours. 10 capsule 0  ? predniSONE (DELTASONE) 50 MG tablet Take 1 tab p.o. daily for 5 days. 5 tablet 0  ? promethazine-dextromethorphan (PROMETHAZINE-DM) 6.25-15 MG/5ML syrup Take 5 mLs by mouth 2 (two) times daily as needed for cough. 118 mL 0  ? ?No facility-administered medications prior to visit.  ? ? ?No Known Allergies ? ?Review of Systems ? ?   ?Objective:  ?  ?Physical Exam ?Vitals reviewed.  ?Constitutional:   ?   Appearance: She is well-developed.  ?HENT:  ?   Head: Normocephalic and atraumatic.  ?Eyes:  ?   Conjunctiva/sclera: Conjunctivae normal.  ?Cardiovascular:  ?   Rate and Rhythm: Normal rate.  ?Pulmonary:  ?   Effort: Pulmonary effort is normal.  ?Skin: ?   General: Skin is dry.  ?   Coloration: Skin is not pale.  ?   Comments: Scalp looks healthy, no rash.  No discrete areas of hair loss.   ?Neurological:  ?   Mental Status: She is alert and oriented to person, place, and time.  ?Psychiatric:     ?   Behavior: Behavior normal.  ? ? ?BP 122/87   Pulse 81   Resp 16   Ht 5\' 4"  (1.626 m)   Wt 122 lb (55.3 kg)   SpO2 99%   BMI 20.94 kg/m?  ?Wt Readings from Last 3 Encounters:  ?03/01/22 122 lb (55.3 kg)  ?01/26/22 120 lb (54.4 kg)  ?12/27/21 121 lb (54.9 kg)  ? ? ?There are no preventive care reminders to display for this patient. ? ?There are no preventive care reminders to display for this patient. ? ? ?Lab Results  ?Component Value Date  ? TSH 0.77 02/07/2020  ? ?Lab Results  ?Component Value Date  ? WBC 7.0 12/27/2021  ? HGB 14.6 12/27/2021  ? HCT 43.9 12/27/2021  ? MCV 95.9 12/27/2021  ? PLT 261 12/27/2021  ? ?Lab Results  ?Component Value Date  ? NA 138 02/07/2020  ? K 4.4 02/07/2020  ? CO2 29 02/07/2020  ? GLUCOSE 97 02/07/2020  ? BUN 10 02/07/2020  ? CREATININE 0.80 02/07/2020  ? BILITOT 0.6 02/07/2020  ? ALKPHOS 61 01/02/2017  ? AST 23 02/07/2020  ? ALT 17 02/07/2020  ? PROT 6.9 02/07/2020  ? ALBUMIN 3.9 01/02/2017  ? CALCIUM 9.1 02/07/2020  ? ?Lab Results  ?Component Value Date  ?  CHOL  149 12/27/2021  ? ?Lab Results  ?Component Value Date  ? HDL 71 12/27/2021  ? ?Lab Results  ?Component Value Date  ? [place] 63 12/27/2021  ? ?Lab Results  ?Component Value Date  ? TRIG 74 12/27/2021  ? ?Lab Results  ?Component Value Date  ? CHOLHDL 2.1 12/27/2021  ? ?Lab Results  ?Component Value Date  ? HGBA1C 4.9 04/22/2015  ? ? ?   ?Assessment & Plan:  ? ?Problem List Items Addressed This Visit   ? ?  ? Musculoskeletal and Integument  ? Alopecia - Primary  ?  Discussed common causes of general hair loss.  Will check for deficiencies. Will call with results. Discussed healthy diet and exercise.  Start Women's Rogaine.  F/U in 3-4 months.   ?  ?  ? Relevant Orders  ? CBC  ? B12  ? TSH  ? Zinc  ? Fe+TIBC+Fer  ? ?Other Visit Diagnoses   ? ? Hair loss      ? Relevant Orders  ? CBC  ? B12  ? TSH  ? Zinc  ? Fe+TIBC+Fer  ? ?  ? ? ?No orders of the defined types were placed in this encounter. ? ?I spent 25 minutes on the day of the encounter to include pre-visit record review, face-to-face time with the patient and post visit ordering of test. ? ? ? ?Beatrice Lecher, MD ? ?

## 2022-03-01 NOTE — Patient Instructions (Signed)
Ok to start Women's rogaine. Follow direction on the tub.  ?

## 2022-03-08 NOTE — Progress Notes (Signed)
Hi Denise, iron levels look really good overall.  Your blood counts good.  No sign of anemia.  Vitamin B12 looks good.  Thyroid looks perfect.  Zinc is still pending.

## 2022-03-09 LAB — CBC
HCT: 43.6 % (ref 35.0–45.0)
Hemoglobin: 14.3 g/dL (ref 11.7–15.5)
MCH: 31.8 pg (ref 27.0–33.0)
MCHC: 32.8 g/dL (ref 32.0–36.0)
MCV: 97.1 fL (ref 80.0–100.0)
MPV: 10.4 fL (ref 7.5–12.5)
Platelets: 275 10*3/uL (ref 140–400)
RBC: 4.49 10*6/uL (ref 3.80–5.10)
RDW: 11.8 % (ref 11.0–15.0)
WBC: 5.9 10*3/uL (ref 3.8–10.8)

## 2022-03-09 LAB — IRON,TIBC AND FERRITIN PANEL
%SAT: 46 % (calc) — ABNORMAL HIGH (ref 16–45)
Ferritin: 82 ng/mL (ref 16–232)
Iron: 176 ug/dL (ref 40–190)
TIBC: 383 mcg/dL (calc) (ref 250–450)

## 2022-03-09 LAB — VITAMIN B12: Vitamin B-12: 309 pg/mL (ref 200–1100)

## 2022-03-09 LAB — ZINC: Zinc: 57 ug/dL — ABNORMAL LOW (ref 60–130)

## 2022-03-09 LAB — TSH: TSH: 1.02 mIU/L

## 2022-03-11 NOTE — Progress Notes (Signed)
Hi Denise, your Zinc is low.  I would start an OTC zinc supplement. You don't have to take more than the recommended dosing on the bottle.

## 2022-04-30 ENCOUNTER — Emergency Department
Admission: RE | Admit: 2022-04-30 | Discharge: 2022-04-30 | Disposition: A | Discharge status: Home / Self Care | Payer: No Typology Code available for payment source | Source: Ambulatory Visit

## 2022-04-30 VITALS — BP 118/85 | HR 89 | Temp 98.4°F | Resp 18

## 2022-04-30 DIAGNOSIS — J0100 Acute maxillary sinusitis, unspecified: Secondary | ICD-10-CM | POA: Diagnosis not present

## 2022-04-30 DIAGNOSIS — R059 Cough, unspecified: Secondary | ICD-10-CM

## 2022-04-30 DIAGNOSIS — J3489 Other specified disorders of nose and nasal sinuses: Secondary | ICD-10-CM | POA: Diagnosis not present

## 2022-04-30 DIAGNOSIS — J309 Allergic rhinitis, unspecified: Secondary | ICD-10-CM | POA: Diagnosis not present

## 2022-04-30 MED ORDER — BENZONATATE 200 MG PO CAPS
200.0000 mg | ORAL_CAPSULE | Freq: Three times a day (TID) | ORAL | 0 refills | Status: AC | PRN
Start: 2022-04-30 — End: 2022-05-07

## 2022-04-30 MED ORDER — AMOXICILLIN-POT CLAVULANATE 875-125 MG PO TABS: 1.0000 | ORAL_TABLET | Freq: Two times a day (BID) | ORAL | 0 refills | Status: AC

## 2022-04-30 MED ORDER — PREDNISONE 20 MG PO TABS: ORAL_TABLET | ORAL | 0 refills | Status: DC

## 2022-04-30 MED ORDER — AZELASTINE HCL 0.1 % NA SOLN: 2.0000 | Freq: Two times a day (BID) | NASAL | 0 refills | Status: AC

## 2022-04-30 NOTE — ED Triage Notes (Signed)
Pt states she went on a cruise last week and when she got home she developed cough, facial pain, nasal congestion and sore throat for the last few days. She has not taken any meds for this.

## 2022-04-30 NOTE — ED Provider Notes (Signed)
[name]    CSN: [id number] Arrival date & time: 04/30/22  1003      History   Chief Complaint Chief Complaint  Patient presents with   Nasal Congestion    Having sinus congestion, face pain, dry cough, ears itch and randomly having pain below ovaries - Entered by patient    HPI Melinda Parsons is a 51 y.o. female.   HPI Pleasant 51 year old female presents with sinus nasal congestion, sinus pain/pressure, and dry cough for 1 week.  PMH significant for HTN and IBS.  Past Medical History:  Diagnosis Date   Anxiety    GERD (gastroesophageal reflux disease)    Hypertension    IBS (irritable bowel syndrome)    Lumbar herniated disc    Migraine    Pre-eclampsia    with daughter   PVC (premature ventricular contraction)    Previous holter in Dec 2011 showed PVCs and 2 3 beat runs of SVT   Seasonal allergies     Patient Active Problem List   Diagnosis Date Noted   Alopecia 03/01/2022   Acute right eye pain 04/21/2021   Blepharospasm 04/21/2021   Canker sore 06/29/2020   Leg cramp 06/26/2019   Aortic calcification (HCC) 01/02/2017   Carpal tunnel syndrome, left 10/05/2016   Anxiety and depression 10/05/2016   Right arm pain 09/10/2015   Vitamin B12 deficiency 09/08/2015   Degenerative disc disease, cervical 123456   Complicated migraine 99991111   Fatigue 04/22/2015   Left lumbar radiculitis 06/11/2014   Bilateral plantar fascia fibromatosis 04/11/2014   Plantar wart of right foot 02/07/2014   Palpitations 11/30/2010    Past Surgical History:  Procedure Laterality Date   NO PAST SURGERIES      OB History   No obstetric history on file.      Home Medications    Prior to Admission medications   Medication Sig Start Date End Date Taking? Authorizing Provider  amoxicillin-clavulanate (AUGMENTIN) 875-125 MG tablet Take 1 tablet by mouth 2 (two) times daily for 10 days. 04/30/22 05/10/22 Yes Eliezer Lofts, FNP  azelastine (ASTELIN) 0.1 %  nasal spray Place 2 sprays into both nostrils 2 (two) times daily. Use in each nostril as directed 04/30/22  Yes Eliezer Lofts, FNP  benzonatate (TESSALON) 200 MG capsule Take 1 capsule (200 mg total) by mouth 3 (three) times daily as needed for up to 7 days for cough. 04/30/22 05/07/22 Yes Eliezer Lofts, FNP  predniSONE (DELTASONE) 20 MG tablet Take 3 tabs PO daily x 5 days. 04/30/22  Yes Eliezer Lofts, FNP  busPIRone (BUSPAR) 30 MG tablet TAKE 1 TABLET BY MOUTH 3 TIMES DAILY. Patient taking differently: 15 mg. Patient takes 15 mg three times a day 02/14/22   Hali Marry, MD  gabapentin (NEURONTIN) 600 MG tablet TAKE 1 TABLET BY MOUTH AT BEDTIME 08/01/21   Silverio Decamp, MD  hyoscyamine (LEVBID) 0.375 MG 12 hr tablet Take 0.375 mg by mouth 2 (two) times daily. She is taking 1 tablet daily    [provider]  NUVARING 0.12-0.015 MG/24HR vaginal ring INSERT ONE RING VAGINALLY ONCE A MONTH AS DIRECTED BY PHYSICIAN 11/15/17   [provider]  Topiramate ER (TROKENDI XR) 100 MG CP24 Take 100 mg by mouth daily. 12/27/21   Hali Marry, MD  tretinoin (RETIN-A) 0.025 % cream SMARTSIG:Sparingly Topical Every Night 01/08/22   [provider]    Family History Family History  Problem Relation Age of Onset   Hypertension Mother  Hyperlipidemia Mother    Breast cancer Mother 81   Diverticulitis Father    Hypertension Other        grandmother   Alcohol abuse Other        grandparent    Social History Social History   Tobacco Use   Smoking status: Never   Smokeless tobacco: Never  Vaping Use   Vaping Use: Never used  Substance Use Topics   Alcohol use: Yes    Alcohol/week: 1.0 - 2.0 standard drink    Types: 1 - 2 Standard drinks or equivalent per week    Comment: Socially   Drug use: No     Allergies   Patient has no known allergies.   Review of Systems Review of Systems  HENT:  Positive for congestion, sinus pressure and sinus pain.    Respiratory:  Positive for cough.   All other systems reviewed and are negative.   Physical Exam Triage Vital Signs ED Triage Vitals  Enc Vitals Group     BP      Pulse      Resp      Temp      Temp src      SpO2      Weight      Height      Head Circumference      Peak Flow      Pain Score      Pain Loc      Pain Edu?      Excl. in [place]?    No data found.  Updated Vital Signs BP 118/85 (BP Location: Right Arm)   Pulse 89   Temp 98.4 F (36.9 C) (Oral)   Resp 18   LMP 04/08/2022 Comment: nuvaring  SpO2 99%       Physical Exam Vitals and nursing note reviewed.  Constitutional:      Appearance: Normal appearance. She is normal weight. She is ill-appearing.  HENT:     Head: Normocephalic and atraumatic.     Right Ear: Tympanic membrane and external ear normal.     Left Ear: Tympanic membrane and external ear normal.     Ears:     Comments: Moderate eustachian tube dysfunction noted bilaterally    Nose:     Comments: Turbinates are erythematous/edematous    Mouth/Throat:     Mouth: Mucous membranes are moist.     Pharynx: Oropharynx is clear.     Comments: Moderate amount of clear drainage of posterior oropharynx noted Eyes:     Extraocular Movements: Extraocular movements intact.     Conjunctiva/sclera: Conjunctivae normal.     Pupils: Pupils are equal, round, and reactive to light.  Cardiovascular:     Rate and Rhythm: Normal rate and regular rhythm.     Pulses: Normal pulses.     Heart sounds: Normal heart sounds.  Pulmonary:     Effort: Pulmonary effort is normal.     Breath sounds: Normal breath sounds. No wheezing, rhonchi or rales.  Musculoskeletal:     Cervical back: Normal range of motion and neck supple. No tenderness.  Lymphadenopathy:     Cervical: No cervical adenopathy.  Skin:    General: Skin is warm and dry.  Neurological:     General: No focal deficit present.     Mental Status: She is alert and oriented to person, place, and time.      UC Treatments / Results  Labs (all labs ordered are listed, but only abnormal  results are displayed) Labs Reviewed - No data to display  EKG   Radiology No results found.  Procedures Procedures (including critical care time)  Medications Ordered in UC Medications - No data to display  Initial Impression / Assessment and Plan / UC Course  I have reviewed the triage vital signs and the nursing notes.  Pertinent labs & imaging results that were available during my care of the patient were reviewed by me and considered in my medical decision making (see chart for details).     MDM: 1.  Acute maxillary sinusitis, recurrence not specified-Rx'd Augmentin; 2.  Sinus pressure-Rx'd Prednisone; 3.  Allergic rhinitis-Rx'd Astelin; 4.  Cough-Rx'd Tessalon Per[place]. Instructed patient to discontinue/hold Allegra and Flonase for the next 10 days.  Advised patient to take medication as directed with food. Instructed patient to take Azelastine and Prednisone with first dose of Augmentin for the next 5 of 10 days.  Advised may use Astelin as needed afterwards for concurrent postnasal drainage/drip.  Encouraged patient to increase daily water intake while taking these medications. Advise patient if symptoms worsen and/or unresolved, please follow up with PCP or here for further evaluation.  Discharged home, hemodynamically stable. Final Clinical Impressions(s) / UC Diagnoses   Final diagnoses:  Acute maxillary sinusitis, recurrence not specified  Allergic rhinitis, unspecified seasonality, unspecified trigger  Sinus pressure  Cough, unspecified type     Discharge Instructions      Instructed patient to discontinue/hold Allegra and Flonase for the next 10 days.  Advised patient to take medication as directed with food. Instructed patient to take Azelastine and Prednisone with first dose of Augmentin for the next 5 of 10 days.  Advised may use Astelin as needed afterwards for concurrent  postnasal drainage/drip.  Encouraged patient to increase daily water intake while taking these medications. Advise patient if symptoms worsen and/or unresolved, please follow up with PCP or here for further evaluation.      ED Prescriptions     Medication Sig Dispense Auth. Provider   amoxicillin-clavulanate (AUGMENTIN) 875-125 MG tablet Take 1 tablet by mouth 2 (two) times daily for 10 days. 20 tablet Eliezer Lofts, FNP   predniSONE (DELTASONE) 20 MG tablet Take 3 tabs PO daily x 5 days. 15 tablet Eliezer Lofts, FNP   azelastine (ASTELIN) 0.1 % nasal spray Place 2 sprays into both nostrils 2 (two) times daily. Use in each nostril as directed 30 mL Eliezer Lofts, FNP   benzonatate (TESSALON) 200 MG capsule Take 1 capsule (200 mg total) by mouth 3 (three) times daily as needed for up to 7 days for cough. 40 capsule Eliezer Lofts, FNP      PDMP not reviewed this encounter.   Eliezer Lofts, [place] 04/30/22 1136

## 2022-04-30 NOTE — Discharge Instructions (Addendum)
Instructed patient to discontinue/hold Allegra and Flonase for the next 10 days.  Advised patient to take medication as directed with food. Instructed patient to take Azelastine and Prednisone with first dose of Augmentin for the next 5 of 10 days.  Advised may use Astelin as needed afterwards for concurrent postnasal drainage/drip.  Encouraged patient to increase daily water intake while taking these medications. Advise patient if symptoms worsen and/or unresolved, please follow up with PCP or here for further evaluation.

## 2022-05-01 IMAGING — MR MR CERVICAL SPINE W/O CM
5 series · 39 of 48 positions shown · non-contrast
Comparison: Prior MRI from 06/18/2015.

CLINICAL DATA: Initial evaluation for chronic bilateral cervical
radiculopathy.

EXAM:
MRI CERVICAL SPINE WITHOUT CONTRAST
TECHNIQUE: Multiplanar, multisequence MR imaging of the cervical spine was
performed. No intravenous contrast was administered.

[Series 5: T2 · sagittal · 3.0mm · 0.69mm/px · 8 of 13 slices shown (1 of 2)]
[im 1/13]
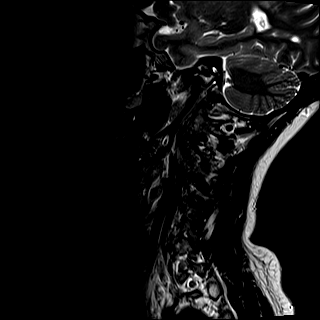
[im 2/13]
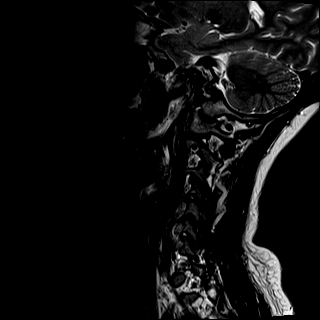
[im 4/13]
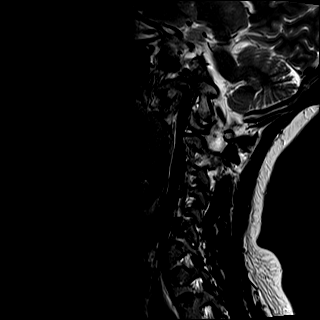
[im 6/13]
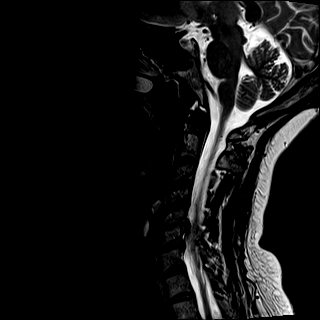
[im 7/13]
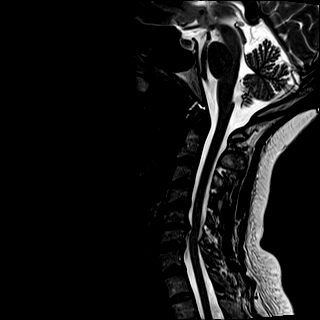
[im 9/13]
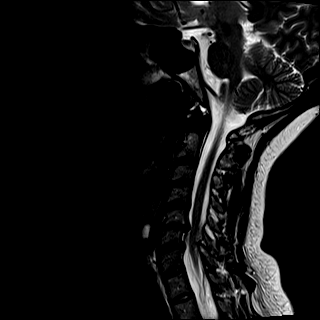
[im 11/13]
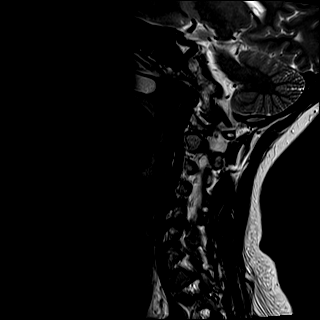
[im 13/13]
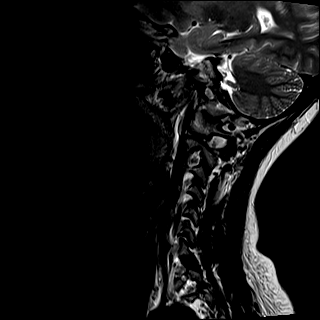

[Series 6: T1 · sagittal · 3.0mm · 0.86mm/px · 7 of 13 slices shown]
[im 1/13]
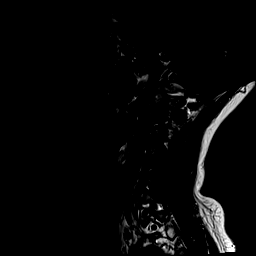
[im 3/13]
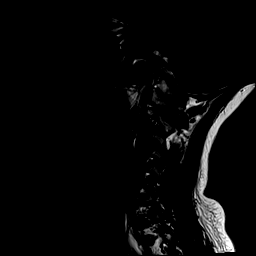
[im 5/13]
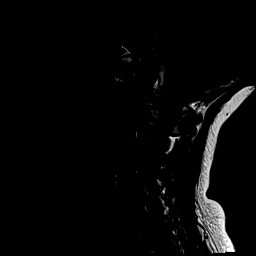
[im 7/13]
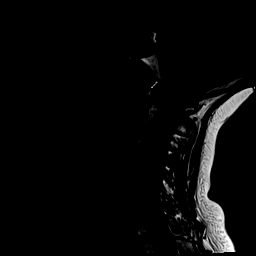
[im 9/13]
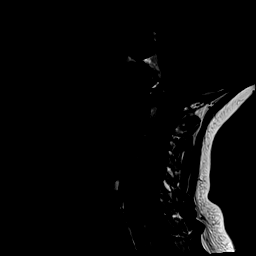
[im 11/13]
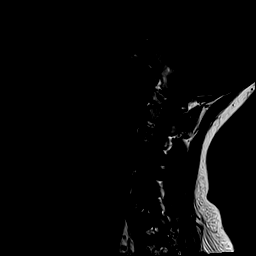
[im 13/13]
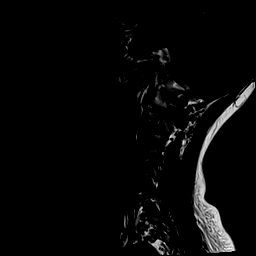

[Series 7: STIR · sagittal · 3.0mm · 0.69mm/px · 7 of 13 slices shown]
[im 1/13]
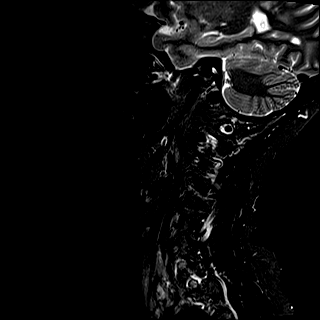
[im 3/13]
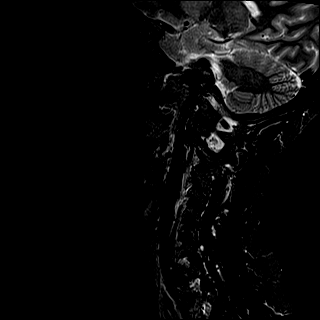
[im 5/13]
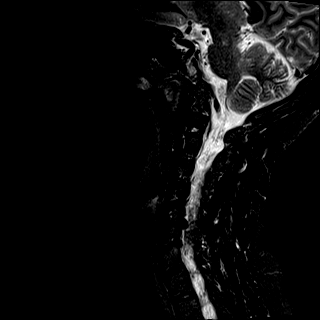
[im 7/13]
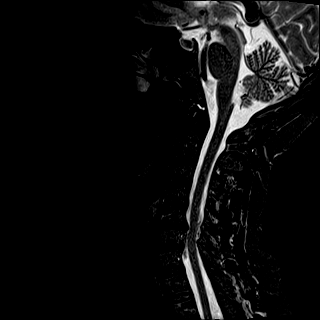
[im 9/13]
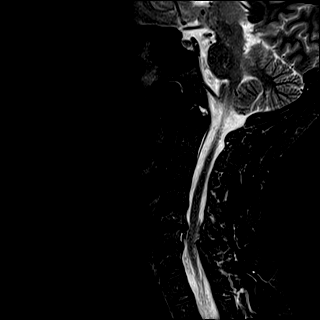
[im 11/13]
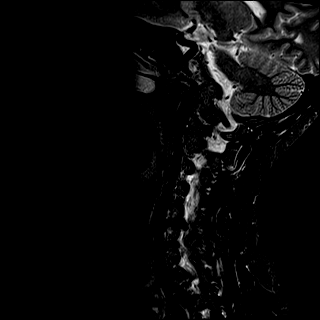
[im 13/13]
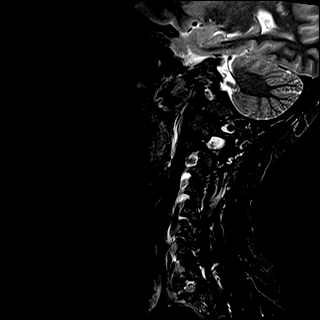

[Series 8: T2 · axial · 3.0mm · 0.62mm/px · z∈[-80,+10]mm · 9 of 25 slices shown (2 of 2)]
[im 1/25]
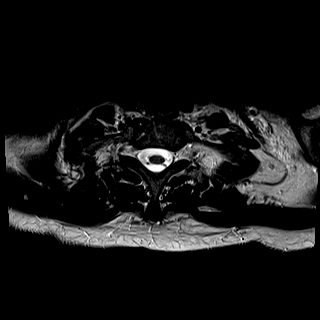
[im 5/25]
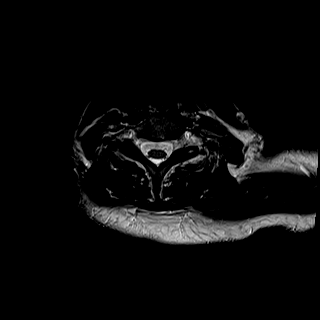
[im 9/25]
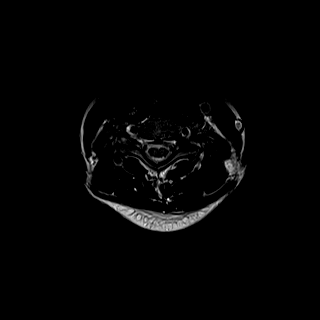
[im 11/25]
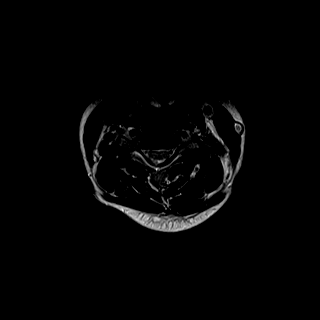
[im 13/25]
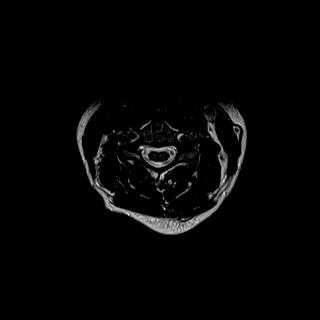
[im 15/25]
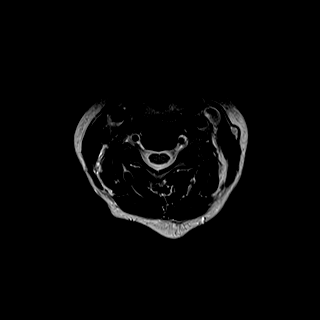
[im 17/25]
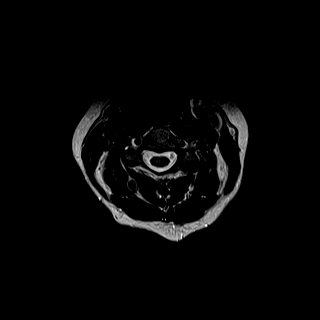
[im 21/25]
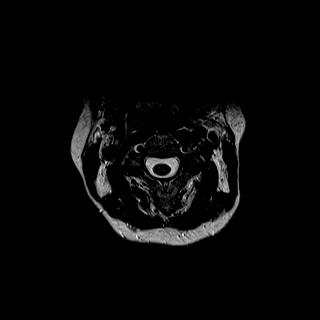
[im 25/25]
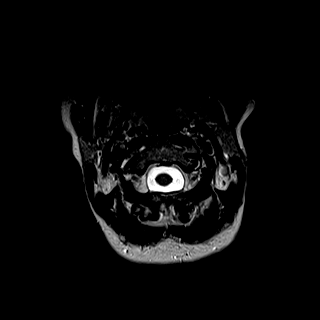

[Series 9: mpgr ax · axial · 3.0mm · 0.35mm/px · z∈[-70,+19]mm · 8 of 25 slices shown]
[im 1/25]
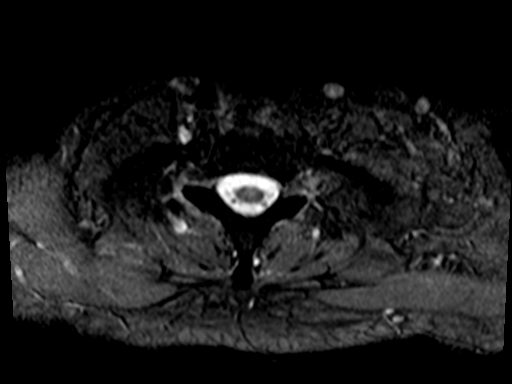
[im 5/25]
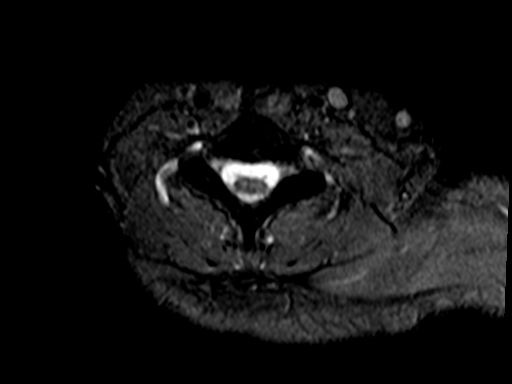
[im 9/25]
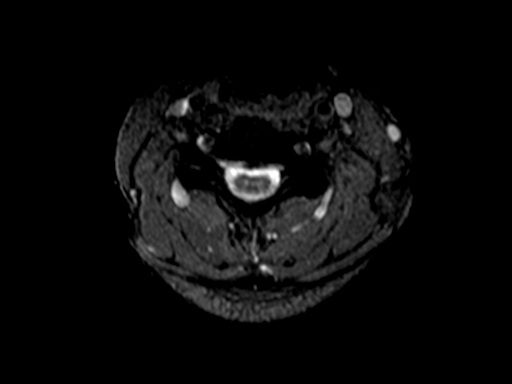
[im 11/25]
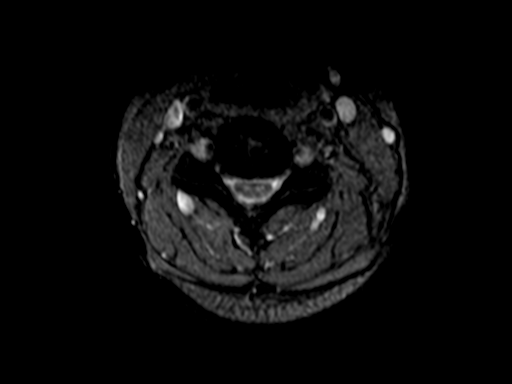
[im 15/25]
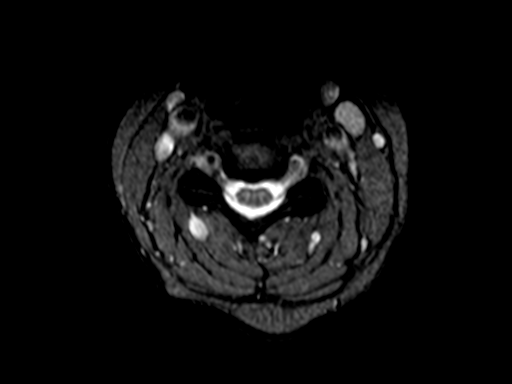
[im 17/25]
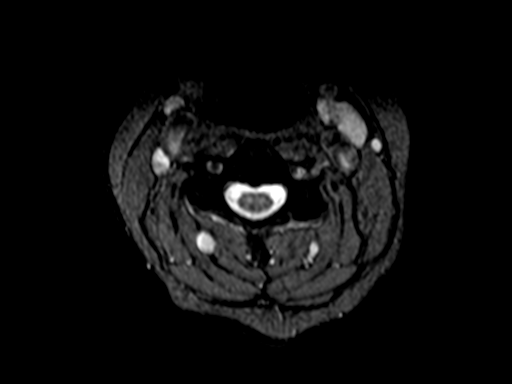
[im 21/25]
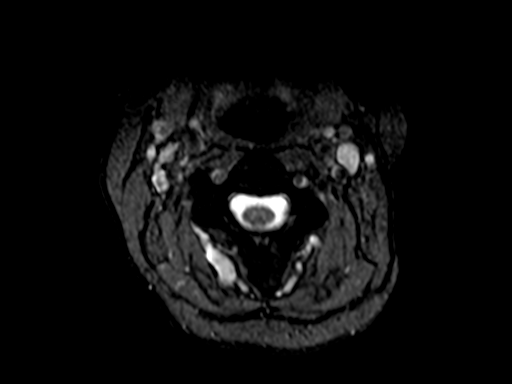
[im 25/25]
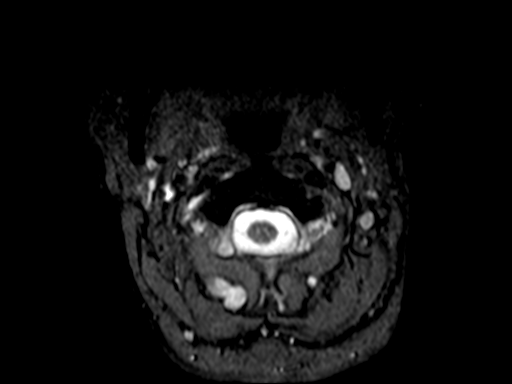

[39 of 48 positions shown; findings below may reference images not displayed]

FINDINGS: Alignment: Straightening of the normal cervical lordosis. Trace 2 mm
retrolisthesis of C5 on C6.

Vertebrae: Vertebral body height maintained without evidence for
acute or chronic fracture. Bone marrow signal intensity within
normal limits without discrete or worrisome osseous lesions. Mild
discogenic reactive endplate changes present about the C5-6 and C6-7
interspaces. No abnormal marrow edema.

Cord: Normal signal and morphology.

Posterior Fossa, vertebral arteries, paraspinal tissues: Visualized
brain and posterior fossa within normal limits. Craniocervical
junction normal. Paraspinous and prevertebral soft tissues within
normal limits. Normal intravascular flow voids seen within the
vertebral arteries bilaterally.

Disc levels:

C2-C3: Unremarkable.

C3-C4: Small central disc protrusion indents the ventral thecal sac
(series 8, image 18). No spinal stenosis or cord deformity.
Superimposed mild left-sided facet degeneration. No foraminal
encroachment.

C4-C5: Mild disc bulge with uncovertebral spurring. No significant
spinal stenosis. Foramina remain patent.

C5-C6: Chronic intervertebral disc space narrowing with diffuse
degenerative disc osteophyte. Broad posterior component flattens and
partially faces the ventral thecal sac resultant mild spinal
stenosis. Mild flattening of the ventral spinal cord. Moderate to
severe right with moderate left C6 foraminal narrowing.

C6-C7: Shallow broad-based right paracentral disc protrusion mildly
indents the ventral thecal sac (series 8, image 18). Mild spinal
stenosis without cord impingement. Foramina remain patent.

C7-T1: Minimal facet hypertrophy. Otherwise unremarkable. No
stenosis.

Visualized upper thoracic spine demonstrates no significant finding.
IMPRESSION: 1. Degenerative disc osteophyte at C5-6 with resultant mild canal
with moderate to severe right worse than left C6 foraminal stenosis.
2. Small right paracentral disc protrusion at C6-7 with resultant
mild spinal stenosis but no cord impingement.

## 2022-05-04 ENCOUNTER — Encounter: Payer: Self-pay | Admitting: Family Medicine

## 2022-05-04 ENCOUNTER — Telehealth: Payer: Self-pay

## 2022-05-04 NOTE — Telephone Encounter (Signed)
Pt called asking about Augmentin script. Says she thinks its been giving her diarrhea. Finished prednisone and feeling a lot better. Ok to stop Augmentin. If sxs return or worsen, follow up with PCP or here for further eval.

## 2022-05-06 ENCOUNTER — Telehealth: Payer: Self-pay | Admitting: Emergency Medicine

## 2022-05-06 NOTE — Telephone Encounter (Signed)
Call back to [facility] after chart review w/ Dr Assunta Found who has instructed pt to go to a clear liquid diet for today & transition to a BRAT diet tomorrow morning. Also instructed to switch to a probiotic OTC today and continue daily until diarrhea resolves. Patient verbalized an understanding of the instructions. If the diarrhea has not resolved over the next few days, pt can follow up here or with her PCP. No other questions at this time

## 2022-05-06 NOTE — Telephone Encounter (Signed)
Call from [place] regarding ongoing watery diarrhea from being on Augmentin (started on 5/20, stopped on 5/24 due to diarrhea- see phone note from 05/04/22) Melinda Parsons states every time she eats she has watery diarrhea. Melinda Parsons also states she has been eating greek yogurt daily. RN to review w/ provider here today and will call Denise back.

## 2022-06-10 ENCOUNTER — Encounter: Payer: Self-pay | Admitting: Family Medicine

## 2022-06-28 ENCOUNTER — Ambulatory Visit: Payer: No Typology Code available for payment source | Admitting: Family Medicine

## 2022-06-30 ENCOUNTER — Encounter: Payer: Self-pay | Admitting: Family Medicine

## 2022-06-30 ENCOUNTER — Ambulatory Visit: Payer: No Typology Code available for payment source | Admitting: Family Medicine

## 2022-06-30 VITALS — BP 114/69 | HR 65 | Ht 64.0 in | Wt 123.0 lb

## 2022-06-30 DIAGNOSIS — R232 Flushing: Secondary | ICD-10-CM | POA: Diagnosis not present

## 2022-06-30 DIAGNOSIS — F419 Anxiety disorder, unspecified: Secondary | ICD-10-CM

## 2022-06-30 DIAGNOSIS — F32A Depression, unspecified: Secondary | ICD-10-CM | POA: Diagnosis not present

## 2022-06-30 MED ORDER — BUSPIRONE HCL 15 MG PO TABS: 15.0000 mg | ORAL_TABLET | Freq: Three times a day (TID) | ORAL | 3 refills | Status: DC

## 2022-06-30 NOTE — Progress Notes (Addendum)
Established Patient Office Visit  Subjective   Patient ID: Melinda Parsons, female    DOB: 1971-04-11  Age: 51 y.o. MRN: [id number]  Chief Complaint  Patient presents with   Anxiety   Depression    HPI  Follow-up anxiety/depression-he is currently on buspirone 15 mg 3 times a day. 30 mg is too strong. Once in a while she take part of an extra tab.  She has had a lot of stress. Her daughter has been dealing with some mental health issues. Her husband may have had another TIA and has been really struggling with fatigue over the last month.   She does utilize EAP through work occasionally. They will cover 12 sessions.   She has been having some occ hot flashes. During the day will suddenly feel hot but no sweating.  At night will occ have a sweating episode.  Flowsheet Row Office Visit from 03/01/2022 in [facility]  PHQ-9 Total Score 5         01/29/2021   11:46 AM 02/04/2020   12:44 PM 06/26/2019    9:42 AM  GAD 7 : Generalized Anxiety Score  Nervous, Anxious, on Edge 3 3 3   Control/stop worrying 2 2 1   Worry too much - different things 2 3 1   Trouble relaxing 2 2 2   Restless 2 2 1   Easily annoyed or irritable 1 1 1   Afraid - awful might happen 1 1 0  Total GAD 7 Score 13 14 9   Anxiety Difficulty Not difficult at all Somewhat difficult Somewhat difficult      ROS    Objective:     BP 114/69   Pulse 65   Ht 5\' 4"  (1.626 m)   Wt 123 lb (55.8 kg)   SpO2 98%   BMI 21.11 kg/m    Physical Exam Vitals and nursing note reviewed.  Constitutional:      Appearance: She is well-developed.  HENT:     Head: Normocephalic and atraumatic.  Cardiovascular:     Rate and Rhythm: Normal rate and regular rhythm.     Heart sounds: Normal heart sounds.  Pulmonary:     Effort: Pulmonary effort is normal.     Breath sounds: Normal breath sounds.  Skin:    General: Skin is warm and dry.  Neurological:     Mental Status: She is alert and  oriented to person, place, and time.  Psychiatric:        Behavior: Behavior normal.      No results found for any visits on 06/30/22.    The 10-year ASCVD risk score (Arnett DK, et al., 2019) is: 0.4%    Assessment & Plan:   Problem List Items Addressed This Visit       Cardiovascular and Mediastinum   Hot flash not due to menopause    Discussed that medications can be use to help if needed, ir hot flashes bc more frequent or severe.          Other   Anxiety and depression - Primary    We discussed that we can always consider changing or adding to meds if needed. Utilize EAP through work.  For now will change to 15mg  tab since splitting the 30s. F/U in 6 months.       Relevant Medications   busPIRone (BUSPAR) 15 MG tablet    Return in about 6 months (around 12/31/2022) for Mood and Migraines.    , MD

## 2022-06-30 NOTE — Assessment & Plan Note (Signed)
Discussed that medications can be use to help if needed, ir hot flashes bc more frequent or severe.

## 2022-06-30 NOTE — Assessment & Plan Note (Addendum)
We discussed that we can always consider changing or adding to meds if needed. Utilize EAP through work.  For now will change to 15mg  tab since splitting the 30s. F/U in 6 months.

## 2022-07-05 ENCOUNTER — Other Ambulatory Visit: Payer: Self-pay | Admitting: Family Medicine

## 2022-07-05 DIAGNOSIS — G43109 Migraine with aura, not intractable, without status migrainosus: Secondary | ICD-10-CM

## 2022-07-19 LAB — HM MAMMOGRAPHY

## 2022-07-31 ENCOUNTER — Other Ambulatory Visit: Payer: Self-pay | Admitting: Sports Medicine

## 2022-07-31 DIAGNOSIS — M5030 Other cervical disc degeneration, unspecified cervical region: Secondary | ICD-10-CM

## 2022-08-02 ENCOUNTER — Other Ambulatory Visit: Payer: Self-pay

## 2022-08-02 DIAGNOSIS — M5030 Other cervical disc degeneration, unspecified cervical region: Secondary | ICD-10-CM

## 2022-08-02 NOTE — Telephone Encounter (Signed)
Pt called and has been out of her Gabapentin for 3 days and really would like to get this refilled today.

## 2022-08-05 ENCOUNTER — Encounter: Payer: Self-pay | Admitting: Sports Medicine

## 2022-08-05 ENCOUNTER — Ambulatory Visit: Payer: No Typology Code available for payment source | Admitting: Sports Medicine

## 2022-08-05 DIAGNOSIS — L650 Telogen effluvium: Secondary | ICD-10-CM

## 2022-08-05 DIAGNOSIS — I700 Atherosclerosis of aorta: Secondary | ICD-10-CM | POA: Diagnosis not present

## 2022-08-05 DIAGNOSIS — M5030 Other cervical disc degeneration, unspecified cervical region: Secondary | ICD-10-CM | POA: Diagnosis not present

## 2022-08-05 DIAGNOSIS — G609 Hereditary and idiopathic neuropathy, unspecified: Secondary | ICD-10-CM

## 2022-08-05 DIAGNOSIS — G629 Polyneuropathy, unspecified: Secondary | ICD-10-CM | POA: Insufficient documentation

## 2022-08-05 MED ORDER — GABAPENTIN 600 MG PO TABS: 600.0000 mg | ORAL_TABLET | Freq: Every day | ORAL | 3 refills | Status: DC

## 2022-08-05 NOTE — Assessment & Plan Note (Addendum)
This is a pleasant 51 year old female, she works as an Medical illustrator, she has had several months of worsening numbness and tingling both legs, lateral aspect to the top of the foot bilaterally worse at night. She did have a lumbar spine MRI in 2016 that did not show any severe areas of neuroforaminal stenosis. She did have some labs done about 5 months ago that did show low zinc and low normal vitamin B12. She has been doing zinc supplementation. Unfortunately continues to have neuropathic symptoms. I will refill her gabapentin, I also like to recheck the serum work-up. We will include methylmalonic acid levels. If insufficient improvement we will proceed with nerve conduction and EMG bilaterally lower extremities with triazolam anxiolysis. Return to see me in a month.  Update: B12 bottom of the normal range but methylmalonic acid normal suggesting no functional B12 deficiency, zinc normal, B1 levels are low, adding supplementation.

## 2022-08-05 NOTE — Progress Notes (Addendum)
    Procedures performed today:    None.  Independent interpretation of notes and tests performed by another provider:   None.  Brief History, Exam, Impression, and Recommendations:    Peripheral neuropathy This is a pleasant 51 year old female, she works as an Medical illustrator, she has had several months of worsening numbness and tingling both legs, lateral aspect to the top of the foot bilaterally worse at night. She did have a lumbar spine MRI in 2016 that did not show any severe areas of neuroforaminal stenosis. She did have some labs done about 5 months ago that did show low zinc and low normal vitamin B12. She has been doing zinc supplementation. Unfortunately continues to have neuropathic symptoms. I will refill her gabapentin, I also like to recheck the serum work-up. We will include methylmalonic acid levels. If insufficient improvement we will proceed with nerve conduction and EMG bilaterally lower extremities with triazolam anxiolysis. Return to see me in a month.  Update: B12 bottom of the normal range but methylmalonic acid normal suggesting no functional B12 deficiency, zinc normal, B1 levels are low, adding supplementation.  Telogen effluvium Has had several recent life stressors including husband with TIAs, has noted some increased hair loss followed by some regrowth. She has a negative hair pull test with only 1 hair coming out, I do think this is the standard telogen effluvium, I discussed with her the pathophysiology and explained that improving in controlling her stress, and watchful waiting was the treatment here.   ____________________________________________ Ihor Austin. Benjamin Stain, M.D., ABFM., CAQSM., AME. Primary Care and Sports Medicine [facility]  Adjunct Professor of Family Medicine  [place] [employer] [facility] [employer]  Restaurant manager, fast food

## 2022-08-05 NOTE — Assessment & Plan Note (Signed)
Has had several recent life stressors including husband with TIAs, has noted some increased hair loss followed by some regrowth. She has a negative hair pull test with only 1 hair coming out, I do think this is the standard telogen effluvium, I discussed with her the pathophysiology and explained that improving in controlling her stress, and watchful waiting was the treatment here.

## 2022-08-09 ENCOUNTER — Encounter: Payer: Self-pay | Admitting: Sports Medicine

## 2022-08-11 LAB — TSH: TSH: 1.28 mIU/L

## 2022-08-11 LAB — COPPER, SERUM: Copper: 167 ug/dL (ref 70–175)

## 2022-08-11 LAB — CBC
HCT: 45.4 % — ABNORMAL HIGH (ref 35.0–45.0)
Hemoglobin: 14.9 g/dL (ref 11.7–15.5)
MCH: 32.2 pg (ref 27.0–33.0)
MCHC: 32.8 g/dL (ref 32.0–36.0)
MCV: 98.1 fL (ref 80.0–100.0)
MPV: 10.3 fL (ref 7.5–12.5)
Platelets: 266 10*3/uL (ref 140–400)
RBC: 4.63 10*6/uL (ref 3.80–5.10)
RDW: 11.6 % (ref 11.0–15.0)
WBC: 6.3 10*3/uL (ref 3.8–10.8)

## 2022-08-11 LAB — COMPREHENSIVE METABOLIC PANEL
AG Ratio: 1.7 (calc) (ref 1.0–2.5)
ALT: 9 U/L (ref 6–29)
AST: 12 U/L (ref 10–35)
Albumin: 4.7 g/dL (ref 3.6–5.1)
Alkaline phosphatase (APISO): 35 U/L (ref 31–125)
BUN: 13 mg/dL (ref 7–25)
CO2: 22 mmol/L (ref 20–32)
Calcium: 9.3 mg/dL (ref 8.6–10.2)
Chloride: 107 mmol/L (ref 98–110)
Creat: 0.89 mg/dL (ref 0.50–0.99)
Globulin: 2.8 g/dL (calc) (ref 1.9–3.7)
Glucose, Bld: 87 mg/dL (ref 65–99)
Potassium: 4.4 mmol/L (ref 3.5–5.3)
Sodium: 139 mmol/L (ref 135–146)
Total Bilirubin: 0.4 mg/dL (ref 0.2–1.2)
Total Protein: 7.5 g/dL (ref 6.1–8.1)

## 2022-08-11 LAB — VITAMIN B1: Vitamin B1 (Thiamine): 7 nmol/L — ABNORMAL LOW (ref 8–30)

## 2022-08-11 LAB — HEMOGLOBIN A1C
Hgb A1c MFr Bld: 4.6 % of total Hgb (ref <5.7)
Mean Plasma Glucose: 85 mg/dL
eAG (mmol/L): 4.7 mmol/L

## 2022-08-11 LAB — LIPID PANEL
Cholesterol: 165 mg/dL (ref <200)
HDL: 73 mg/dL (ref >=50)
LDL Cholesterol (Calc): 74 mg/dL (calc)
Non-HDL Cholesterol (Calc): 92 mg/dL (calc) (ref <130)
Total CHOL/HDL Ratio: 2.3 (calc) (ref <5.0)
Triglycerides: 99 mg/dL (ref <150)

## 2022-08-11 LAB — METHYLMALONIC ACID AND HOMOCYSTEINE
Homocysteine: 11.1 umol/L — ABNORMAL HIGH (ref <10.4)
Methylmalonic Acid, Quant: 118 nmol/L (ref 87–318)

## 2022-08-11 LAB — ANCA SCREEN W REFLEX TITER: ANCA SCREEN: NEGATIVE

## 2022-08-11 LAB — VITAMIN B12: Vitamin B-12: 365 pg/mL (ref 200–1100)

## 2022-08-11 LAB — ZINC: Zinc: 97 ug/dL (ref 60–130)

## 2022-08-12 MED ORDER — THIAMINE HCL 100 MG PO TABS: 100.0000 mg | ORAL_TABLET | Freq: Every day | ORAL | 3 refills | Status: DC

## 2022-08-12 NOTE — Addendum Note (Signed)
Addended by: Monica Becton on: 08/12/2022 09:09 AM   Modules accepted: Orders

## 2022-09-02 ENCOUNTER — Ambulatory Visit: Payer: No Typology Code available for payment source | Admitting: Sports Medicine

## 2022-09-02 DIAGNOSIS — E538 Deficiency of other specified B group vitamins: Secondary | ICD-10-CM

## 2022-09-02 DIAGNOSIS — G609 Hereditary and idiopathic neuropathy, unspecified: Secondary | ICD-10-CM

## 2022-09-02 MED ORDER — VITAMIN B-12 1000 MCG PO TABS: 1000.0000 ug | ORAL_TABLET | Freq: Every day | ORAL | 11 refills | Status: AC

## 2022-09-02 NOTE — Assessment & Plan Note (Signed)
B12 now somewhat low, restarting supplementation.

## 2022-09-02 NOTE — Progress Notes (Signed)
    Procedures performed today:    None.  Independent interpretation of notes and tests performed by another provider:   None.  Brief History, Exam, Impression, and Recommendations:    Vitamin B12 deficiency B12 now somewhat low, restarting supplementation.  Peripheral neuropathy Please see prior note for further details, B12 and B1 were relatively low, supplementing with both, she is starting to feel better. She has really not started her B12, I like to see her back in a month to see how things are going. We also talked about fatigue which can be improved with her B12, I have also advised that she consider one quarter coffee to half cup of coffee and an exercise prescription.    ____________________________________________ Gwen Her. Dianah Field, M.D., ABFM., CAQSM., AME. Primary Care and Sports Medicine [facility]  Adjunct Professor of [place] [employer] [facility] [employer] of Medicine  Risk manager

## 2022-09-02 NOTE — Assessment & Plan Note (Signed)
Please see prior note for further details, B12 and B1 were relatively low, supplementing with both, she is starting to feel better. She has really not started her B12, I like to see her back in a month to see how things are going. We also talked about fatigue which can be improved with her B12, I have also advised that she consider one quarter coffee to half cup of coffee and an exercise prescription.

## 2022-09-30 ENCOUNTER — Encounter: Payer: Self-pay | Admitting: Sports Medicine

## 2022-09-30 ENCOUNTER — Ambulatory Visit: Payer: No Typology Code available for payment source | Admitting: Sports Medicine

## 2022-09-30 DIAGNOSIS — S0003XA Contusion of scalp, initial encounter: Secondary | ICD-10-CM

## 2022-09-30 DIAGNOSIS — E538 Deficiency of other specified B group vitamins: Secondary | ICD-10-CM

## 2022-09-30 DIAGNOSIS — R1013 Epigastric pain: Secondary | ICD-10-CM

## 2022-09-30 DIAGNOSIS — S0093XA Contusion of unspecified part of head, initial encounter: Secondary | ICD-10-CM | POA: Insufficient documentation

## 2022-09-30 MED ORDER — PANTOPRAZOLE SODIUM 40 MG PO TBEC: 40.0000 mg | DELAYED_RELEASE_TABLET | Freq: Two times a day (BID) | ORAL | 3 refills | Status: DC

## 2022-09-30 NOTE — Assessment & Plan Note (Signed)
Bumped her head right before her appointment, right posterior parietal region. No immediate concussive symptoms, just localized pain. She does have a palpable knot. Her neurologic exam and balance exam is fairly normal with only 1 error on tandem and 1 error with single-leg stance. I do not think she has a concussion, she can return as needed for this.

## 2022-09-30 NOTE — Assessment & Plan Note (Signed)
Melinda Parsons returns, she is very pleasant 51 year old female, she has a history of B12 deficiency, we last checked her B12 levels and the B12 was in the bottom of the range at 300, methylmalonic acid levels were elevated suggesting a functional deficiency. And B12 supplementation and symptoms have improved considerably. It is possible that her topiramate is also causing some of her peripheral neuropathic symptoms but she feels good right now so we will leave things alone. I will recheck her levels.

## 2022-09-30 NOTE — Assessment & Plan Note (Signed)
Melinda Parsons does have a fairly complex abdominal history, she has had a long history of intermittent abdominal pain, she has seen a gastroenterologist and had an upper endoscopy, it was negative per her report. She was treated with pantoprazole and amitriptyline, never took the amitriptyline. Did well with pantoprazole, but developed increasing midepigastric pain, colicky. She stopped her Protonix, symptoms did not improve so she restarted it today and symptoms have improved slightly. Abdominal exam is benign with diffuse tenderness but no guarding, rigidity, negative Murphy sign. I think we can go ahead and go up to twice a day on the pantoprazole, if she is not feeling better after 2 to 4 weeks I would like her to get back in with her gastroenterologist.

## 2022-09-30 NOTE — Progress Notes (Signed)
    Procedures performed today:    None.  Independent interpretation of notes and tests performed by another provider:   None.  Brief History, Exam, Impression, and Recommendations:    Vitamin B12 deficiency Melinda Parsons returns, she is very pleasant 51 year old female, she has a history of B12 deficiency, we last checked her B12 levels and the B12 was in the bottom of the range at 300, methylmalonic acid levels were elevated suggesting a functional deficiency. And B12 supplementation and symptoms have improved considerably. It is possible that her topiramate is also causing some of her peripheral neuropathic symptoms but she feels good right now so we will leave things alone. I will recheck her levels.  Midepigastric pain Melinda Parsons does have a fairly complex abdominal history, she has had a long history of intermittent abdominal pain, she has seen a gastroenterologist and had an upper endoscopy, it was negative per her report. She was treated with pantoprazole and amitriptyline, never took the amitriptyline. Did well with pantoprazole, but developed increasing midepigastric pain, colicky. She stopped her Protonix, symptoms did not improve so she restarted it today and symptoms have improved slightly. Abdominal exam is benign with diffuse tenderness but no guarding, rigidity, negative Murphy sign. I think we can go ahead and go up to twice a day on the pantoprazole, if she is not feeling better after 2 to 4 weeks I would like her to get back in with her gastroenterologist.  Contusion of head Bumped her head right before her appointment, right posterior parietal region. No immediate concussive symptoms, just localized pain. She does have a palpable knot. Her neurologic exam and balance exam is fairly normal with only 1 error on tandem and 1 error with single-leg stance. I do not think she has a concussion, she can return as needed for  this.    ____________________________________________ Gwen Her. Dianah Field, M.D., ABFM., CAQSM., AME. Primary Care and Sports Medicine [facility]  Adjunct Professor of [place] [employer] [facility] of Medicine  Risk manager

## 2022-10-04 LAB — CBC
HCT: 42.1 % (ref 35.0–45.0)
Hemoglobin: 14.2 g/dL (ref 11.7–15.5)
MCH: 31.8 pg (ref 27.0–33.0)
MCHC: 33.7 g/dL (ref 32.0–36.0)
MCV: 94.2 fL (ref 80.0–100.0)
MPV: 10.9 fL (ref 7.5–12.5)
Platelets: 256 10*3/uL (ref 140–400)
RBC: 4.47 10*6/uL (ref 3.80–5.10)
RDW: 11.4 % (ref 11.0–15.0)
WBC: 6.3 10*3/uL (ref 3.8–10.8)

## 2022-10-04 LAB — VITAMIN B12: Vitamin B-12: 638 pg/mL (ref 200–1100)

## 2022-10-04 LAB — METHYLMALONIC ACID, SERUM: Methylmalonic Acid, Quant: 80 nmol/L — ABNORMAL LOW (ref 87–318)

## 2022-10-31 ENCOUNTER — Ambulatory Visit: Payer: No Typology Code available for payment source | Admitting: Sports Medicine

## 2022-10-31 DIAGNOSIS — R1013 Epigastric pain: Secondary | ICD-10-CM | POA: Diagnosis not present

## 2022-10-31 NOTE — Assessment & Plan Note (Addendum)
Melinda Parsons is a very pleasant 51 year old female, she was having midepigastric pain, she did see a gastroenterologist in the past, upper endoscopy was negative. Never had any symptoms or signs of GI bleed. She was treated with pantoprazole and amitriptyline but never took the amitriptyline. We doubled her pantoprazole at the last visit and symptoms resolved. At this point she can go ahead and discontinue her pantoprazole and we discussed options to reduce the chance of acid reflux including decreased alcohol consumption, decrease consumption of chocolate, and avoiding consumption of liquids or solids less than 2 hours before bedtime.Marland Kitchen

## 2022-10-31 NOTE — Progress Notes (Signed)
    Procedures performed today:    None.  Independent interpretation of notes and tests performed by another provider:   None.  Brief History, Exam, Impression, and Recommendations:    Midepigastric pain Melinda Parsons is a very pleasant 51 year old female, she was having midepigastric pain, she did see a gastroenterologist in the past, upper endoscopy was negative. Never had any symptoms or signs of GI bleed. She was treated with pantoprazole and amitriptyline but never took the amitriptyline. We doubled her pantoprazole at the last visit and symptoms resolved. At this point she can go ahead and discontinue her pantoprazole and we discussed options to reduce the chance of acid reflux including decreased alcohol consumption, decrease consumption of chocolate, and avoiding consumption of liquids or solids less than 2 hours before bedtime..    ____________________________________________ Ihor Austin. Benjamin Stain, M.D., ABFM., CAQSM., AME. Primary Care and Sports Medicine [facility]  Adjunct Professor of Family Medicine  [place] [employer] [facility] [employer]  Restaurant manager, fast food

## 2023-01-05 ENCOUNTER — Ambulatory Visit: Payer: No Typology Code available for payment source | Admitting: Family Medicine

## 2023-02-02 ENCOUNTER — Ambulatory Visit: Payer: No Typology Code available for payment source | Admitting: Family Medicine

## 2023-02-02 ENCOUNTER — Encounter: Payer: Self-pay | Admitting: Family Medicine

## 2023-02-02 VITALS — BP 128/85 | HR 78 | Ht 64.0 in | Wt 122.0 lb

## 2023-02-02 DIAGNOSIS — F419 Anxiety disorder, unspecified: Secondary | ICD-10-CM

## 2023-02-02 DIAGNOSIS — I7300 Raynaud's syndrome without gangrene: Secondary | ICD-10-CM

## 2023-02-02 DIAGNOSIS — G43109 Migraine with aura, not intractable, without status migrainosus: Secondary | ICD-10-CM | POA: Diagnosis not present

## 2023-02-02 DIAGNOSIS — F32A Depression, unspecified: Secondary | ICD-10-CM

## 2023-02-02 DIAGNOSIS — R052 Subacute cough: Secondary | ICD-10-CM

## 2023-02-02 NOTE — Assessment & Plan Note (Signed)
Migraines have been fairly well-controlled overall.  She does notice a difference if she does not take the medication.

## 2023-02-02 NOTE — Assessment & Plan Note (Signed)
Continue current regimen.     03/01/2022    5:08 PM 12/27/2021    7:31 AM 01/29/2021   11:49 AM 02/04/2020   12:41 PM 06/26/2019    9:43 AM  Depression screen PHQ 2/9  Decreased Interest 1 0 3 1 0  Down, Depressed, Hopeless 0 0 1 1 0  PHQ - 2 Score 1 0 4 2 0  Altered sleeping 1  2 0   Tired, decreased energy 1  2 3   $ Change in appetite 1  0 3   Feeling bad or failure about yourself  0  1 1   Trouble concentrating 1  2 0   Moving slowly or fidgety/restless 0  0 0   Suicidal thoughts 0  0 0   PHQ-9 Score 5  11 9   $ Difficult doing work/chores Somewhat difficult  Somewhat difficult Somewhat difficult

## 2023-02-02 NOTE — Assessment & Plan Note (Signed)
It sounds like she has had some increase in symptoms recently.  But it does sound consistent with Raynaud's.  We could consider medication as an option and we discussed that today.  She will hold off for now and just try to work on keeping her hands warm if not improving or getting worse am happy to discuss treatment options.  Okay

## 2023-02-02 NOTE — Progress Notes (Signed)
Established Patient Office Visit  Subjective   Patient ID: Melinda Parsons, female    DOB: [birth year]  Age: 53 y.o. MRN: [id number]  Chief Complaint  Patient presents with   mood   Insomnia    HPI  F/U Mood -next month follow-up for anxiety/depression.  Still just taking buspirone.  She is taking it 3 times a day now.  She has had some increase stressors.  Her daughter ended up moving to the [facility] area and got a full-time job.  But she has been having some problems.  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  Migraine headaches-she is actually been doing well on Trokendi XR.  Continue current regimen.  She also has Raynaud's and occasionally will get purple discoloration on her fingertips especially when she goes more cold at the grocery store.  She says lately though her left thumb has been bothering her more she says it will actually turn white and then get painful.  She is also been getting an intermittent cough she says it happens probably less than once a week where she will just suddenly start coughing while talking.  She says once it happens she has a coughing fit she cannot stop coughing.  It then sounds like her throat was closing up and then she will start wheezing eventually it eases off and goes away. She has not found any specific triggers.    ROS    Objective:     BP 128/85   Pulse 78   Ht 5' 4"$  (1.626 m)   Wt 122 lb (55.3 kg)   SpO2 100%   BMI 20.94 kg/m    Physical Exam Vitals and nursing note reviewed.  Constitutional:      Appearance: She is well-developed.  HENT:     Head: Normocephalic and atraumatic.  Cardiovascular:     Rate and Rhythm: Normal rate and regular rhythm.     Heart sounds: Normal heart sounds.  Pulmonary:     Effort: Pulmonary effort is  normal.     Breath sounds: Normal breath sounds.  Skin:    General: Skin is warm and dry.  Neurological:     Mental Status: She is alert and oriented to person, place, and time.  Psychiatric:        Behavior: Behavior normal.      Results for orders placed or performed in visit on 02/02/23  HM MAMMOGRAPHY  Result Value Ref Range   HM Mammogram 0-4 Bi-Rad 0-4 Bi-Rad, Self Reported Normal      The 10-year ASCVD risk score (Arnett DK, et al., 2019) is: 0.7%    Assessment & Plan:   Problem List Items Addressed This Visit       Cardiovascular and Mediastinum   Raynaud's disease    It sounds like she has had some increase in symptoms recently.  But it does sound consistent with Raynaud's.  We could consider medication as an option and we discussed that today.  She will hold off for now and just try to work on keeping her hands warm if not improving or getting worse am happy to discuss treatment options.  Okay      Complicated migraine - Primary    Migraines have been fairly well-controlled overall.  She does notice a difference if she does not take the medication.        Other   Anxiety and depression    Continue current regimen.     03/01/2022    5:08 PM 12/27/2021    7:31 AM 01/29/2021   11:49 AM 02/04/2020   12:41 PM 06/26/2019    9:43 AM  Depression screen PHQ 2/9  Decreased Interest 1 0 3 1 0  Down, Depressed, Hopeless 0 0 1 1 0  PHQ - 2 Score 1 0 4 2 0  Altered sleeping 1  2 0   Tired, decreased energy 1  2 3   $ Change in appetite 1  0 3   Feeling bad or failure about yourself  0  1 1   Trouble concentrating 1  2 0   Moving slowly or fidgety/restless 0  0 0   Suicidal thoughts 0  0 0   PHQ-9 Score 5  11 9   $ Difficult doing work/chores Somewhat difficult  Somewhat difficult Somewhat difficult         Other Visit Diagnoses     Subacute cough       Relevant Orders   Ambulatory referral to ENT       Subacute cough-refer to ENT for further workup I am  not sure if this could be an upper airway cough syndrome or if she is may be getting a pooling of mucus from silent GERD or from significant postnasal drip.  But I do think ENT could help [place] figure this out.  Return in about 6 months (around 08/03/2023) for Mood.    Beatrice Lecher, MD

## 2023-03-02 ENCOUNTER — Ambulatory Visit: Payer: No Typology Code available for payment source | Admitting: Family Medicine

## 2023-03-02 VITALS — BP 113/89 | HR 96 | Resp 20 | Ht 64.0 in | Wt 121.1 lb

## 2023-03-02 DIAGNOSIS — J302 Other seasonal allergic rhinitis: Secondary | ICD-10-CM | POA: Insufficient documentation

## 2023-03-02 DIAGNOSIS — L0390 Cellulitis, unspecified: Secondary | ICD-10-CM | POA: Diagnosis not present

## 2023-03-02 DIAGNOSIS — H8113 Benign paroxysmal vertigo, bilateral: Secondary | ICD-10-CM | POA: Diagnosis not present

## 2023-03-02 DIAGNOSIS — I10 Essential (primary) hypertension: Secondary | ICD-10-CM | POA: Insufficient documentation

## 2023-03-02 DIAGNOSIS — K589 Irritable bowel syndrome without diarrhea: Secondary | ICD-10-CM | POA: Insufficient documentation

## 2023-03-02 MED ORDER — CEPHALEXIN 500 MG PO CAPS: 500.0000 mg | ORAL_CAPSULE | Freq: Four times a day (QID) | ORAL | 0 refills | Status: AC

## 2023-03-02 MED ORDER — MECLIZINE HCL 25 MG PO TABS: 25.0000 mg | ORAL_TABLET | Freq: Three times a day (TID) | ORAL | 0 refills | Status: DC | PRN

## 2023-03-02 NOTE — Assessment & Plan Note (Signed)
-   Since vertigo is positional and patient had issues with completing a Dix-Hallpike test, it is likely that she has be BPPV -Have given patient meclizine for dizziness to take as needed.  Have also sent referral for vestibular physical therapy -Recommended hydration and rest.

## 2023-03-02 NOTE — Progress Notes (Signed)
Acute Office Visit  Subjective:     Patient ID: Melinda Parsons, female    DOB: 26-Dec-1970, 52 y.o.   MRN: [id number]  Chief Complaint  Patient presents with   Dizziness   SWOLLEN PINKY TOE    RIGHT FOOT    HPI Patient is in today for vertigo and swollen pinky toe.  Patient says her vertigo is worse with position changes.  She has not tried anything that makes it better or worse.  She also notes last night her right pinky toe was painful and swollen she did end up cutting her toenail and said it felt a little better after soaking it in water.  She is still having pain in her pinky today.  Review of Systems  Constitutional:  Negative for chills and fever.  Respiratory:  Negative for cough and shortness of breath.   Cardiovascular:  Negative for chest pain.  Musculoskeletal:        Toe pain  Neurological:  Positive for dizziness. Negative for headaches.        Objective:    BP 113/89 (BP Location: Left Arm, Cuff Size: Normal)   Pulse 96   Resp 20   Ht 5\' 4"  (1.626 m)   Wt 121 lb 1.9 oz (54.9 kg)   SpO2 100%   BMI 20.79 kg/m    Physical Exam Vitals and nursing note reviewed.  Constitutional:      General: She is not in acute distress.    Appearance: Normal appearance.  HENT:     Head: Normocephalic and atraumatic.     Right Ear: External ear normal.     Left Ear: External ear normal.     Nose: Nose normal.  Eyes:     Conjunctiva/sclera: Conjunctivae normal.  Cardiovascular:     Rate and Rhythm: Normal rate and regular rhythm.  Pulmonary:     Effort: Pulmonary effort is normal.     Breath sounds: Normal breath sounds.  Skin:    Comments: Erythema of right pinky toe with tenderness to palpation.  No warmth noted  Neurological:     General: No focal deficit present.     Mental Status: She is alert and oriented to person, place, and time.     Comments: Dix-Hallpike test was attempted however patient got vertigo after laying down during half the test and the  rest of the test was suspended.  Psychiatric:        Mood and Affect: Mood normal.        Behavior: Behavior normal.        Thought Content: Thought content normal.        Judgment: Judgment normal.     No results found for any visits on 03/02/23.      Assessment & Plan:   Problem List Items Addressed This Visit       Nervous and Auditory   Benign paroxysmal positional vertigo due to bilateral vestibular disorder - Primary    - Since vertigo is positional and patient had issues with completing a Dix-Hallpike test, it is likely that she has be BPPV -Have given patient meclizine for dizziness to take as needed.  Have also sent referral for vestibular physical therapy -Recommended hydration and rest.      Relevant Medications   meclizine (ANTIVERT) 25 MG tablet   Other Relevant Orders   Ambulatory referral to Physical Therapy     Musculoskeletal and Integument   Cellulitis of skin    -On exam pinky toe  is red and swollen.  Patient says it was hot yesterday.  Will go ahead and treat for cellulitis since the weekend is coming up.  I have given patient Keflex.  Recommended continued warm water soaks      Relevant Medications   cephALEXin (KEFLEX) 500 MG capsule    Meds ordered this encounter  Medications   meclizine (ANTIVERT) 25 MG tablet    Sig: Take 1 tablet (25 mg total) by mouth 3 (three) times daily as needed for dizziness.    Dispense:  15 tablet    Refill:  0   cephALEXin (KEFLEX) 500 MG capsule    Sig: Take 1 capsule (500 mg total) by mouth 4 (four) times daily for 5 days.    Dispense:  20 capsule    Refill:  0    Return if symptoms worsen or fail to improve.  Owens Loffler, DO

## 2023-03-02 NOTE — Assessment & Plan Note (Signed)
-  On exam pinky toe is red and swollen.  Patient says it was hot yesterday.  Will go ahead and treat for cellulitis since the weekend is coming up.  I have given patient Keflex.  Recommended continued warm water soaks

## 2023-04-05 LAB — HM PAP SMEAR

## 2023-04-11 ENCOUNTER — Ambulatory Visit
Admission: RE | Admit: 2023-04-11 | Discharge: 2023-04-11 | Disposition: A | Discharge status: Home / Self Care | Payer: No Typology Code available for payment source | Source: Ambulatory Visit | Attending: Family Medicine | Admitting: Family Medicine

## 2023-04-11 ENCOUNTER — Ambulatory Visit: Payer: No Typology Code available for payment source

## 2023-04-11 ENCOUNTER — Ambulatory Visit: Payer: No Typology Code available for payment source | Admitting: Family Medicine

## 2023-04-11 VITALS — BP 121/83 | HR 105 | Temp 98.0°F | Resp 19

## 2023-04-11 DIAGNOSIS — S39012A Strain of muscle, fascia and tendon of lower back, initial encounter: Secondary | ICD-10-CM | POA: Diagnosis not present

## 2023-04-11 DIAGNOSIS — M542 Cervicalgia: Secondary | ICD-10-CM

## 2023-04-11 DIAGNOSIS — S161XXA Strain of muscle, fascia and tendon at neck level, initial encounter: Secondary | ICD-10-CM

## 2023-04-11 MED ORDER — PREDNISONE 10 MG (21) PO TBPK: ORAL_TABLET | Freq: Every day | ORAL | 0 refills | Status: DC

## 2023-04-11 MED ORDER — METAXALONE 800 MG PO TABS: 800.0000 mg | ORAL_TABLET | Freq: Three times a day (TID) | ORAL | 0 refills | Status: DC
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
## 2023-04-11 NOTE — Progress Notes (Unsigned)
     Established patient visit   Patient: Melinda Parsons   DOB: September 19, 1971   52 y.o. Female  MRN: [id number] Visit Date: 04/12/2023  Today's healthcare provider: Charlton Amor, DO   No chief complaint on file.   SUBJECTIVE   No chief complaint on file.  HPI  Pt presents for left ear fullness and pain.  Review of Systems     No outpatient medications have been marked as taking for the 04/12/23 encounter (Appointment) with Charlton Amor, DO.    OBJECTIVE    LMP 03/06/2023   Physical Exam   {Show previous labs (optional):23736}    ASSESSMENT & PLAN    Problem List Items Addressed This Visit   None   No follow-ups on file.      No orders of the defined types were placed in this encounter.   No orders of the defined types were placed in this encounter.    Charlton Amor, DO  [facility] at [facility] [phone] (phone) [phone] (fax)  [facility]

## 2023-04-11 NOTE — Discharge Instructions (Addendum)
Instructed to instructed patient to take medications as directed with food to completion.  Encouraged patient to increase daily water intake to 64 ounces per day while taking these medications.  Advised may use Skelaxin daily or as needed for accompanying muscle spasms.  Advised patient if symptoms worsen and/or unresolved please follow-up with PCP or here for further evaluation.

## 2023-04-12 ENCOUNTER — Encounter: Payer: Self-pay | Admitting: Family Medicine

## 2023-04-12 ENCOUNTER — Ambulatory Visit: Payer: No Typology Code available for payment source | Admitting: Family Medicine

## 2023-04-12 VITALS — BP 111/73 | HR 99 | Temp 98.3°F | Ht 64.0 in | Wt 120.8 lb

## 2023-04-12 DIAGNOSIS — H60502 Unspecified acute noninfective otitis externa, left ear: Secondary | ICD-10-CM | POA: Insufficient documentation

## 2023-04-12 DIAGNOSIS — I700 Atherosclerosis of aorta: Secondary | ICD-10-CM | POA: Insufficient documentation

## 2023-04-12 DIAGNOSIS — B351 Tinea unguium: Secondary | ICD-10-CM | POA: Diagnosis not present

## 2023-04-12 DIAGNOSIS — R052 Subacute cough: Secondary | ICD-10-CM | POA: Diagnosis not present

## 2023-04-12 MED ORDER — ATORVASTATIN CALCIUM 10 MG PO TABS
10.0000 mg | ORAL_TABLET | Freq: Every day | ORAL | 3 refills | Status: DC
Start: 2023-04-12 — End: 2023-12-20

## 2023-04-12 MED ORDER — CIPROFLOXACIN-DEXAMETHASONE 0.3-0.1 % OT SUSP: 4.0000 [drp] | Freq: Two times a day (BID) | OTIC | 0 refills | Status: AC

## 2023-04-12 MED ORDER — ALBUTEROL SULFATE HFA 108 (90 BASE) MCG/ACT IN AERS
2.0000 | INHALATION_SPRAY | Freq: Four times a day (QID) | RESPIRATORY_TRACT | 0 refills | Status: DC | PRN
Start: 2023-04-12 — End: 2023-04-13

## 2023-04-12 MED ORDER — OMEPRAZOLE 20 MG PO CPDR
20.0000 mg | DELAYED_RELEASE_CAPSULE | Freq: Every day | ORAL | 1 refills | Status: DC
Start: 2023-04-12 — End: 2023-04-13

## 2023-04-12 MED ORDER — CICLOPIROX 8 % EX SOLN
Freq: Every day | CUTANEOUS | 0 refills | Status: DC
Start: 2023-04-12 — End: 2023-05-13
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
## 2023-04-12 NOTE — Progress Notes (Unsigned)
Jacqualynn Parco, female    DOB: 1971-09-23   MRN: [id number]   Brief patient profile:  51  yowf never smoker/ allergies all her life with smoker exposure with ear and sinus infections and pred / abx but never inhaler referred to pulmonary clinic 04/13/2023 by Dr Linford Arnold  for recurrent cough x esp 2019 much worse since March 2024  EGD with dilation done 08/31/22  then pt d/c'd  PPI well prior to onset of exac March 2024   ENT [facility] March 2024 > neg eval   History of Present Illness  04/13/2023  Pulmonary/ 1st office eval/Obelia Bonello  Chief Complaint  Patient presents with   Consult    Cough with "stringy mucus" x 2 months  Dyspnea:  mostly related to cough  Cough: variable but present 24 h worse at the end of insp >  yellow / green  Sleep: electric slt elevation SABA use: made it worse Prednisone 4/29  Prilosec started 4/30 Cough so hard she vomits not lately though, also choke /gag with globus   No obvious other patterns day to day or daytime variability or assoc   mucus plugs or hemoptysis or cp or chest tightness, subjective wheeze or overt   hb symptoms.    . Also denies any obvious fluctuation of symptoms with weather or environmental changes or other aggravating or alleviating factors except as outlined above   No unusual exposure hx or h/o childhood pna/ asthma or knowledge of premature birth.  Current Allergies, Complete Past Medical History, Past Surgical History, Family History, and Social History were reviewed in [employer] record.  ROS  The following are not active complaints unless bolded Hoarseness, sore throat, dysphagia, dental problems, itching, sneezing,  nasal congestion or discharge of excess mucus or purulent secretions, ear ache,   fever, chills, sweats, unintended wt loss or wt gain, classically pleuritic or exertional cp,  orthopnea pnd or arm/hand swelling  or leg swelling, presyncope, palpitations, abdominal pain, anorexia, nausea, vomiting,  diarrhea  or change in bowel habits or change in bladder habits, change in stools or change in urine, dysuria, hematuria,  rash, arthralgias, visual complaints, headache, numbness, weakness or ataxia or problems with walking or coordination,  change in mood or  memory.              Past Medical History:  Diagnosis Date   Anxiety    GERD (gastroesophageal reflux disease)    Hypertension    IBS (irritable bowel syndrome)    Lumbar herniated disc    Migraine    Pre-eclampsia    with daughter   PVC (premature ventricular contraction)    Previous holter in Dec 2011 showed PVCs and 2 3 beat runs of SVT   Seasonal allergies     Outpatient Medications Prior to Visit  Medication Sig Dispense Refill   albuterol (VENTOLIN HFA) 108 (90 Base) MCG/ACT inhaler Inhale 2 puffs into the lungs every 6 (six) hours as needed for wheezing or shortness of breath. 8 g 0   atorvastatin (LIPITOR) 10 MG tablet Take 1 tablet (10 mg total) by mouth daily. 90 tablet 3   azelastine (ASTELIN) 0.1 % nasal spray Place 2 sprays into both nostrils 2 (two) times daily. Use in each nostril as directed 30 mL 0   busPIRone (BUSPAR) 15 MG tablet Take 1 tablet (15 mg total) by mouth 3 (three) times daily. Patient takes 15 mg three times a day. Ok to take extra tab once a day if  needed. 290 tablet 3   ciclopirox (PENLAC) 8 % solution Apply topically at bedtime. Apply over nail and surrounding skin. Apply daily over previous coat. After seven (7) days, may remove with alcohol and continue cycle. 6.6 mL 0   ciprofloxacin-dexamethasone (CIPRODEX) OTIC suspension Place 4 drops into the left ear 2 (two) times daily for 7 days. 1 mL 0   cyanocobalamin (VITAMIN B12) 1000 MCG tablet Take 1 tablet (1,000 mcg total) by mouth daily. 30 tablet 11   gabapentin (NEURONTIN) 600 MG tablet Take 1 tablet (600 mg total) by mouth at bedtime. 90 tablet 3   hyoscyamine (LEVBID) 0.375 MG 12 hr tablet Take 0.375 mg by mouth 2 (two) times daily. She  is taking 1 tablet daily     meclizine (ANTIVERT) 25 MG tablet Take 1 tablet (25 mg total) by mouth 3 (three) times daily as needed for dizziness. 15 tablet 0   metaxalone (SKELAXIN) 800 MG tablet Take 1 tablet (800 mg total) by mouth 3 (three) times daily. 21 tablet 0   NUVARING 0.12-0.015 MG/24HR vaginal ring INSERT ONE RING VAGINALLY ONCE A MONTH AS DIRECTED BY PHYSICIAN  3   omeprazole (PRILOSEC) 20 MG capsule Take 1 capsule (20 mg total) by mouth daily. 30 capsule 1   predniSONE (STERAPRED UNI-PAK 21 TAB) 10 MG (21) TBPK tablet Take by mouth daily. Take 6 tabs by mouth daily  for 2 days, then 5 tabs for 2 days, then 4 tabs for 2 days, then 3 tabs for 2 days, 2 tabs for 2 days, then 1 tab by mouth daily for 2 days 42 tablet 0   thiamine (VITAMIN B1) 100 MG tablet Take 1 tablet (100 mg total) by mouth daily. 90 tablet 3   Topiramate ER (TROKENDI XR) 100 MG CP24 TAKE 1 CAPSULE BY MOUTH EVERY DAY 90 capsule 1   tretinoin (RETIN-A) 0.025 % cream SMARTSIG:Sparingly Topical Every Night     No facility-administered medications prior to visit.     Objective:     BP 104/64 (BP Location: Left Arm, Patient Position: Sitting, Cuff Size: Normal)   Pulse 78   Temp 98.6 F (37 C) (Oral)   Ht 5\' 4"  (1.626 m)   Wt 121 lb 3.2 oz (55 kg)   LMP 03/06/2023   SpO2 97%   BMI 20.80 kg/m   SpO2: 97 %  Healthy appearing amb wf nad    HEENT : Oropharynx  clear      Nasal turbinates mod severe Swelling / no obvious purulent secretions/ ears nl    NECK :  without  apparent JVD/ palpable Nodes/TM    LUNGS: no acc muscle use,  Nl contour chest which is clear to A and P bilaterally without cough on insp or exp maneuvers   CV:  RRR  no s3 or murmur or increase in P2, and no edema   ABD:  soft and nontender    MS:  Nl gait/ ext warm without deformities Or obvious joint restrictions  calf tenderness, cyanosis or clubbing    SKIN: warm and dry without lesions    NEURO:  alert, approp, nl sensorium  with  no motor or cerebellar deficits apparent.       Assessment   Upper airway cough syndrome Onset 2019 but much worse since March 2024 with gagging/ highly sensitive to perfume - FEN0 04/13/2023  = 7  - Allergy screen 04/13/2023 >  Eos 0. /  IgE  pending  - Cyclical cough protocol plus  added empirical augmentin x 10 day for suspected sinusitis   Upper airway cough syndrome (previously labeled PNDS),  is so named because it's frequently impossible to sort out how much is  CR/sinusitis with freq throat clearing (which can be related to primary GERD)   vs  causing  secondary (" extra esophageal")  GERD from wide swings in gastric pressure that occur with throat clearing, often  promoting self use of mint and menthol lozenges that reduce the lower esophageal sphincter tone and exacerbate the problem further in a cyclical fashion.   These are the same pts (now being labeled as having "irritable larynx syndrome" by some cough centers) who not infrequently have a history of having failed to tolerate ace inhibitors,  dry powder inhalers or biphosphonates or report having atypical/extraesophageal reflux symptoms that don't respond to standard doses of PPI  and are easily confused as having aecopd or asthma flares by even experienced allergists/ pulmonologists (myself included).  Of the three most common causes of  Sub-acute / recurrent or chronic cough, only one (GERD)  can actually contribute to/ trigger  the other two (asthma and post nasal drip syndrome)  and perpetuate the cylce of cough.  While not intuitively obvious, many patients with chronic low grade reflux do not cough until there is a primary insult that disturbs the protective epithelial barrier and exposes sensitive nerve endings.   This is typically viral but can due to PNDS and  either may apply here.     >>>> The point is that once this occurs, it is difficult to eliminate the cycle  using anything but a maximally effective acid  suppression regimen at least in the short run, accompanied by an appropriate diet to address non acid GERD and control / eliminate the cough itself for at least 3 days with tramadol and pnds with 1st gen H1 blockers per guidelines    F/u in 4-6 weeks to see if can keep this from keep coming back / call sooner if needed         Each maintenance medication was reviewed in detail including emphasizing most importantly the difference between maintenance and prns and under what circumstances the prns are to be triggered using an action plan format where appropriate.  Total time for H and P, chart review, counseling, reviewing hfa device(s) and generating customized AVS unique to this office visit / same day charting = 45 min           Sandrea Hughs, MD 04/13/2023

## 2023-04-12 NOTE — Assessment & Plan Note (Signed)
Aortic atherosclerosis seen on chest x-ray done in urgent care yesterday.  Lipid panel reviewed from August 2023 was normal.  Patient has no family history to her knowledge of early heart disease.  However, being that plaque was visualized I will go ahead and start patient on Lipitor 10 mg daily.

## 2023-04-12 NOTE — Assessment & Plan Note (Signed)
During exam of the left ear some erythema noted in the canal as well as pain with palpation of the pinna - Will go ahead and treat with Ciprodex.

## 2023-04-12 NOTE — Assessment & Plan Note (Signed)
Toenail fungus identified on the right foot post getting her nails done in [place] at a new nail salon.  She does note her right fifth digit toenail fell off.  Area does look fungal in nature we will go ahead and give her treatment

## 2023-04-12 NOTE — Patient Instructions (Signed)
Tomorrow 04/13/23 at 9:30AM Arrive early  [facility] [address] [place], [place] [address]

## 2023-04-13 ENCOUNTER — Encounter: Payer: Self-pay | Admitting: Internal Medicine

## 2023-04-13 ENCOUNTER — Ambulatory Visit: Payer: No Typology Code available for payment source | Admitting: Internal Medicine

## 2023-04-13 VITALS — BP 104/64 | HR 78 | Temp 98.6°F | Ht 64.0 in | Wt 121.2 lb

## 2023-04-13 DIAGNOSIS — M546 Pain in thoracic spine: Secondary | ICD-10-CM

## 2023-04-13 DIAGNOSIS — R058 Other specified cough: Secondary | ICD-10-CM

## 2023-04-13 LAB — CBC WITH DIFFERENTIAL/PLATELET
Basophils Absolute: 0 10*3/uL (ref 0.0–0.1)
Basophils Relative: 0.5 % (ref 0.0–3.0)
Eosinophils Absolute: 0 10*3/uL (ref 0.0–0.7)
Eosinophils Relative: 0.3 % (ref 0.0–5.0)
HCT: 43.7 % (ref 36.0–46.0)
Hemoglobin: 14.8 g/dL (ref 12.0–15.0)
Lymphocytes Relative: 18.6 % (ref 12.0–46.0)
Lymphs Abs: 1.5 10*3/uL (ref 0.7–4.0)
MCHC: 33.9 g/dL (ref 30.0–36.0)
MCV: 93.8 fl (ref 78.0–100.0)
Monocytes Absolute: 0.6 10*3/uL (ref 0.1–1.0)
Monocytes Relative: 7.2 % (ref 3.0–12.0)
Neutro Abs: 5.7 10*3/uL (ref 1.4–7.7)
Neutrophils Relative %: 73.4 % (ref 43.0–77.0)
Platelets: 239 10*3/uL (ref 150.0–400.0)
RBC: 4.66 Mil/uL (ref 3.87–5.11)
RDW: 13 % (ref 11.5–15.5)
WBC: 7.8 10*3/uL (ref 4.0–10.5)

## 2023-04-13 LAB — POCT EXHALED NITRIC OXIDE: FeNO level (ppb): 10

## 2023-04-13 MED ORDER — FAMOTIDINE 20 MG PO TABS
ORAL_TABLET | ORAL | 11 refills | Status: DC
Start: 2023-04-13 — End: 2024-02-20

## 2023-04-13 MED ORDER — TRAMADOL HCL 50 MG PO TABS: 50.0000 mg | ORAL_TABLET | Freq: Four times a day (QID) | ORAL | 0 refills | Status: DC | PRN

## 2023-04-13 MED ORDER — AMOXICILLIN-POT CLAVULANATE 875-125 MG PO TABS
1.0000 | ORAL_TABLET | Freq: Two times a day (BID) | ORAL | 0 refills | Status: DC
Start: 2023-04-13 — End: 2023-05-13

## 2023-04-13 MED ORDER — PANTOPRAZOLE SODIUM 40 MG PO TBEC: 40.0000 mg | DELAYED_RELEASE_TABLET | Freq: Every day | ORAL | 2 refills | Status: DC

## 2023-04-13 NOTE — Assessment & Plan Note (Addendum)
Onset 2019 but much worse since March 2024 with gagging/ highly sensitive to perfume - FEN0 04/13/2023  = 7  - Allergy screen 04/13/2023 >  Eos 0. /  IgE  pending  - Cyclical cough protocol plus added empirical augmentin x 10 day for suspected sinusitis   Upper airway cough syndrome (previously labeled PNDS),  is so named because it's frequently impossible to sort out how much is  CR/sinusitis with freq throat clearing (which can be related to primary GERD)   vs  causing  secondary (" extra esophageal")  GERD from wide swings in gastric pressure that occur with throat clearing, often  promoting self use of mint and menthol lozenges that reduce the lower esophageal sphincter tone and exacerbate the problem further in a cyclical fashion.   These are the same pts (now being labeled as having "irritable larynx syndrome" by some cough centers) who not infrequently have a history of having failed to tolerate ace inhibitors,  dry powder inhalers or biphosphonates or report having atypical/extraesophageal reflux symptoms that don't respond to standard doses of PPI  and are easily confused as having aecopd or asthma flares by even experienced allergists/ pulmonologists (myself included).  Of the three most common causes of  Sub-acute / recurrent or chronic cough, only one (GERD)  can actually contribute to/ trigger  the other two (asthma and post nasal drip syndrome)  and perpetuate the cylce of cough.  While not intuitively obvious, many patients with chronic low grade reflux do not cough until there is a primary insult that disturbs the protective epithelial barrier and exposes sensitive nerve endings.   This is typically viral but can due to PNDS and  either may apply here.     >>>> The point is that once this occurs, it is difficult to eliminate the cycle  using anything but a maximally effective acid suppression regimen at least in the short run, accompanied by an appropriate diet to address non acid GERD and  control / eliminate the cough itself for at least 3 days with tramadol and pnds with 1st gen H1 blockers per guidelines    F/u in 4-6 weeks to see if can keep this from keep coming back / call sooner if needed         Each maintenance medication was reviewed in detail including emphasizing most importantly the difference between maintenance and prns and under what circumstances the prns are to be triggered using an action plan format where appropriate.  Total time for H and P, chart review, counseling, reviewing hfa device(s) and generating customized AVS unique to this office visit / same day charting = 45 min

## 2023-04-13 NOTE — Patient Instructions (Addendum)
We will check FENO  test today   Please remember to go to the lab department   for your tests - we will call you with the results when they are available.  The key to effective treatment for your cough is eliminating the non-stop cycle of cough you're stuck in long enough to let your airway heal completely and then see if there is anything still making you cough once you stop the cough suppression, but this should take no more than 5 days to figure out  First take delsym two tsp every 12 hours and supplement if needed with  tramadol 50 mg up to 1-2 every 4 hours to suppress the urge to cough at all or even clear your throat. Swallowing water or using ice chips/non mint and menthol containing candies (such as lifesavers or sugarless jolly ranchers) are also effective.  You should rest your voice and avoid activities that you know make you cough.  Once you have eliminated the cough for 3 straight days try reducing the tramadol first,  then the delsym as tolerated.    Protonix (pantoprazole) Take 30-60 min before first meal of the day and Pepcid 20 mg one bedtime plus chlorpheniramine 4 mg x 2 at bedtime (both available over the counter)  until cough is completely gone for at least a week without the need for cough suppression  GERD (REFLUX)  is an extremely common cause of respiratory symptoms, many times with no significant heartburn at all.    It can be treated with medication, but also with lifestyle changes including avoidance of late meals, excessive alcohol, smoking cessation, and avoid fatty foods, chocolate, peppermint, colas, red wine, and acidic juices such as orange juice.  NO MINT OR MENTHOL PRODUCTS SO NO COUGH DROPS  USE HARD CANDY INSTEAD (jolley ranchers or Stover's or Lifesavers (all available in sugarless versions) NO OIL BASED VITAMINS - use powdered substitutes.  Please schedule a follow up office visit in 4-6 weeks, sooner if needed

## 2023-04-14 LAB — IGE: IgE (Immunoglobulin E), Serum: <2 kU/L (ref <=114)

## 2023-04-16 ENCOUNTER — Telehealth: Payer: Self-pay | Admitting: Internal Medicine

## 2023-04-19 NOTE — Telephone Encounter (Signed)
Patient is returning phone call. Patient phone number is [phone].

## 2023-04-20 ENCOUNTER — Ambulatory Visit (INDEPENDENT_AMBULATORY_CARE_PROVIDER_SITE_OTHER): Payer: Self-pay | Admitting: Family Medicine

## 2023-04-20 ENCOUNTER — Encounter: Payer: Self-pay | Admitting: Family Medicine

## 2023-04-20 ENCOUNTER — Ambulatory Visit: Payer: No Typology Code available for payment source

## 2023-04-20 VITALS — BP 134/80 | HR 79 | Ht 64.0 in | Wt 119.0 lb

## 2023-04-20 DIAGNOSIS — M549 Dorsalgia, unspecified: Secondary | ICD-10-CM

## 2023-04-20 DIAGNOSIS — M542 Cervicalgia: Secondary | ICD-10-CM

## 2023-04-20 DIAGNOSIS — M545 Low back pain, unspecified: Secondary | ICD-10-CM

## 2023-04-20 MED ORDER — CELECOXIB 200 MG PO CAPS
200.0000 mg | ORAL_CAPSULE | Freq: Two times a day (BID) | ORAL | 0 refills | Status: DC
Start: 2023-04-20 — End: 2023-05-13

## 2023-04-20 NOTE — Telephone Encounter (Signed)
Results have been discussed with patient. Closing encounter
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
## 2023-04-26 NOTE — Progress Notes (Signed)
Hi Denise, good news is they did not see any sign of fracture or malalignment.  No significant bony abnormalities.  If you feel like you are still really struggling with your pain we could always consider an MRI for further workup if needed I still think you would benefit greatly from formal physical therapy.  I think they can help with a lot of the tightness and spasm in the muscle tissue.  Hopefully they have contacted you to get you scheduled.
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
## 2023-05-02 ENCOUNTER — Encounter: Payer: Self-pay | Admitting: Rehabilitative and Restorative Service Providers"

## 2023-05-02 ENCOUNTER — Ambulatory Visit
Payer: No Typology Code available for payment source | Attending: Family Medicine | Admitting: Rehabilitative and Restorative Service Providers"

## 2023-05-02 ENCOUNTER — Other Ambulatory Visit: Payer: Self-pay

## 2023-05-02 DIAGNOSIS — M549 Dorsalgia, unspecified: Secondary | ICD-10-CM

## 2023-05-02 DIAGNOSIS — Y939 Activity, unspecified: Secondary | ICD-10-CM | POA: Insufficient documentation

## 2023-05-02 DIAGNOSIS — M545 Low back pain, unspecified: Secondary | ICD-10-CM | POA: Diagnosis not present

## 2023-05-02 DIAGNOSIS — R29898 Other symptoms and signs involving the musculoskeletal system: Secondary | ICD-10-CM

## 2023-05-02 DIAGNOSIS — R293 Abnormal posture: Secondary | ICD-10-CM | POA: Diagnosis not present

## 2023-05-02 DIAGNOSIS — Y929 Unspecified place or not applicable: Secondary | ICD-10-CM | POA: Insufficient documentation

## 2023-05-02 DIAGNOSIS — M542 Cervicalgia: Secondary | ICD-10-CM | POA: Insufficient documentation

## 2023-05-08 ENCOUNTER — Other Ambulatory Visit: Payer: Self-pay | Admitting: Family Medicine

## 2023-05-08 DIAGNOSIS — G43109 Migraine with aura, not intractable, without status migrainosus: Secondary | ICD-10-CM

## 2023-05-10 ENCOUNTER — Ambulatory Visit: Payer: No Typology Code available for payment source

## 2023-05-10 DIAGNOSIS — R293 Abnormal posture: Secondary | ICD-10-CM

## 2023-05-10 DIAGNOSIS — M542 Cervicalgia: Secondary | ICD-10-CM | POA: Diagnosis not present

## 2023-05-10 DIAGNOSIS — M545 Low back pain, unspecified: Secondary | ICD-10-CM

## 2023-05-10 DIAGNOSIS — R29898 Other symptoms and signs involving the musculoskeletal system: Secondary | ICD-10-CM

## 2023-05-10 DIAGNOSIS — M549 Dorsalgia, unspecified: Secondary | ICD-10-CM
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
## 2023-05-11 ENCOUNTER — Encounter: Payer: Self-pay | Admitting: Rehabilitative and Restorative Service Providers"

## 2023-05-12 ENCOUNTER — Ambulatory Visit: Payer: No Typology Code available for payment source | Admitting: Internal Medicine

## 2023-05-12 ENCOUNTER — Encounter: Payer: Self-pay | Admitting: Internal Medicine

## 2023-05-12 VITALS — BP 104/70 | HR 90 | Temp 98.1°F | Ht 64.0 in | Wt 119.2 lb

## 2023-05-12 DIAGNOSIS — R058 Other specified cough: Secondary | ICD-10-CM | POA: Diagnosis not present

## 2023-05-12 NOTE — Progress Notes (Unsigned)
Melinda Parsons, female    DOB: 1971/07/20   MRN: [id number]   Brief patient profile:  51  yowf never smoker/ allergies all her life with smoker exposure with ear and sinus infections and pred / abx but never inhaler referred to pulmonary clinic 04/13/2023 by Dr Linford Arnold  for recurrent cough x esp 2019 much worse since March 2024  EGD with dilation done 08/31/22  then pt d/c'd  PPI well prior to onset of exac March 2024   ENT WS March 2024 > neg eval   History of Present Illness  04/13/2023  Pulmonary/ 1st office eval/Lynore Coscia  Chief Complaint  Patient presents with   Consult    Cough with "stringy mucus" x 2 months  Dyspnea:  mostly related to cough  Cough: variable but present 24 h worse at the end of insp >  yellow / green  Sleep: electric slt elevation SABA use: made it worse Prednisone 4/29  Prilosec started 4/30 Cough so hard she vomits not lately though, also choke /gag with globus  Rec First take delsym two tsp every 12 hours and supplement if needed with  tramadol 50 mg up to 1-2 every 4 hours Once you have eliminated the cough for 3 straight days try reducing the tramadol first,  then the delsym as tolerated.   Protonix (pantoprazole) Take 30-60 min before first meal of the day and Pepcid 20 mg one bedtime plus chlorpheniramine 4 mg x 2 at bedtime (both available over the counter)  until cough is completely gone for at least a week without the need for cough suppression GERD diet reviewed, bed blocks rec    - FEN0 04/13/2023  = 7  - Allergy screen 04/13/2023 >  Eos 0.3 /  IgE less than 2    05/12/2023  f/u ov/Jonathandavid Marlett re: cough x 02/2023    maint on gerd rx   Chief Complaint  Patient presents with   Follow-up    Doing well  Dyspnea:  Not limited by breathing from desired activities   Cough: resolved  Sleeping: flat fine  SABA use: none  02: none      No obvious day to day or daytime variability or assoc excess/ purulent sputum or mucus plugs or hemoptysis or cp or chest  tightness, subjective wheeze or overt sinus or hb symptoms.   Sleeping  without nocturnal  or early am exacerbation  of respiratory  c/o's or need for noct saba. Also denies any obvious fluctuation of symptoms with weather or environmental changes or other aggravating or alleviating factors except as outlined above   No unusual exposure hx or h/o childhood pna/ asthma or knowledge of premature birth.  Current Allergies, Complete Past Medical History, Past Surgical History, Family History, and Social History were reviewed in [employer] record.  ROS  The following are not active complaints unless bolded Hoarseness, sore throat, dysphagia, dental problems, itching, sneezing,  nasal congestion or discharge of excess mucus or purulent secretions, ear ache,   fever, chills, sweats, unintended wt loss or wt gain, classically pleuritic or exertional cp,  orthopnea pnd or arm/hand swelling  or leg swelling, presyncope, palpitations, abdominal pain, anorexia, nausea, vomiting, diarrhea  or change in bowel habits or change in bladder habits, change in stools or change in urine, dysuria, hematuria,  rash, arthralgias, visual complaints, headache, numbness, weakness or ataxia or problems with walking or coordination,  change in mood or  memory.        Current Meds  Medication Sig   atorvastatin (LIPITOR) 10 MG tablet Take 1 tablet (10 mg total) by mouth daily.   azelastine (ASTELIN) 0.1 % nasal spray Place 2 sprays into both nostrils 2 (two) times daily. Use in each nostril as directed   busPIRone (BUSPAR) 15 MG tablet Take 1 tablet (15 mg total) by mouth 3 (three) times daily. Patient takes 15 mg three times a day. Ok to take extra tab once a day if needed.   cyanocobalamin (VITAMIN B12) 1000 MCG tablet Take 1 tablet (1,000 mcg total) by mouth daily.   famotidine (PEPCID) 20 MG tablet One after supper   gabapentin (NEURONTIN) 600 MG tablet Take 1 tablet (600 mg total) by mouth at  bedtime.   hyoscyamine (LEVBID) 0.375 MG 12 hr tablet Take 0.375 mg by mouth 2 (two) times daily. She is taking 1 tablet daily   NUVARING 0.12-0.015 MG/24HR vaginal ring INSERT ONE RING VAGINALLY ONCE A MONTH AS DIRECTED BY PHYSICIAN   thiamine (VITAMIN B1) 100 MG tablet Take 1 tablet (100 mg total) by mouth daily.   Topiramate ER (TROKENDI XR) 100 MG CP24 TAKE 1 CAPSULE BY MOUTH EVERY DAY   tretinoin (RETIN-A) 0.025 % cream SMARTSIG:Sparingly Topical Every Night             Past Medical History:  Diagnosis Date   Anxiety    GERD (gastroesophageal reflux disease)    Hypertension    IBS (irritable bowel syndrome)    Lumbar herniated disc    Migraine    Pre-eclampsia    with daughter   PVC (premature ventricular contraction)    Previous holter in Dec 2011 showed PVCs and 2 3 beat runs of SVT   Seasonal allergies         Objective:    Wt Readings from Last 3 Encounters:  05/12/23 119 lb 3.2 oz (54.1 kg)  04/20/23 119 lb (54 kg)  04/13/23 121 lb 3.2 oz (55 kg)      Vital signs reviewed  05/12/2023  - Note at rest 02 sats  99% on RA   General appearance:    amb pleasant wf nad   HEENT : Oropharynx  pristine        NECK :  without  apparent JVD/ palpable Nodes/TM    LUNGS: no acc muscle use,  Nl contour chest which is clear to A and P bilaterally without cough on insp or exp maneuvers   CV:  RRR  no s3 or murmur or increase in P2, and no edema     SKIN: warm and dry without lesions    NEURO:  alert, approp, nl sensorium with  no motor or cerebellar deficits apparent.            Assessment

## 2023-05-12 NOTE — Patient Instructions (Signed)
Stop pantoprazole but continue the pepcid after bfast and supper after two weeks then stop the morning dose and after two week then ok to stop pm pepcid   At the first onset of any cough resume the full regimen   Pantopraozole 40 = prilosec 20 x 2   Pulmonary follow up is as needed

## 2023-05-13 NOTE — Assessment & Plan Note (Signed)
Onset 2019 but much worse since March 2024 with gagging/ highly sensitive to perfume - FEN0 04/13/2023  = 7  - Allergy screen 04/13/2023 >  Eos 0.3 /  IgE less than 2  - Cyclical cough protocol plus added empirical augmentin x 10 day for suspected sinusitis > resolved as of 05/12/2023 > try taper off gerd rx   Advised on how to taper slowly but go back to full dose PPI/ h2 hs  immediately for cough for any reasons due to the cyclical nature of cough and gerd.   Pulmonary f/u can be prn          Each maintenance medication was reviewed in detail including emphasizing most importantly the difference between maintenance and prns and under what circumstances the prns are to be triggered using an action plan format where appropriate.  Total time for H and P, chart review, counseling,  and generating customized AVS unique to this office visit / same day charting = 25 min

## 2023-05-15 ENCOUNTER — Ambulatory Visit: Payer: No Typology Code available for payment source | Attending: Family Medicine

## 2023-05-15 DIAGNOSIS — M542 Cervicalgia: Secondary | ICD-10-CM | POA: Diagnosis present

## 2023-05-15 DIAGNOSIS — M545 Low back pain, unspecified: Secondary | ICD-10-CM | POA: Insufficient documentation

## 2023-05-15 DIAGNOSIS — R29898 Other symptoms and signs involving the musculoskeletal system: Secondary | ICD-10-CM | POA: Diagnosis present

## 2023-05-15 DIAGNOSIS — R293 Abnormal posture: Secondary | ICD-10-CM | POA: Insufficient documentation

## 2023-05-15 DIAGNOSIS — M549 Dorsalgia, unspecified: Secondary | ICD-10-CM | POA: Diagnosis present
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
## 2023-05-17 ENCOUNTER — Ambulatory Visit: Payer: No Typology Code available for payment source

## 2023-05-17 DIAGNOSIS — M549 Dorsalgia, unspecified: Secondary | ICD-10-CM

## 2023-05-17 DIAGNOSIS — M545 Low back pain, unspecified: Secondary | ICD-10-CM

## 2023-05-17 DIAGNOSIS — R29898 Other symptoms and signs involving the musculoskeletal system: Secondary | ICD-10-CM

## 2023-05-17 DIAGNOSIS — M542 Cervicalgia: Secondary | ICD-10-CM
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
## 2023-05-22 ENCOUNTER — Ambulatory Visit: Payer: No Typology Code available for payment source

## 2023-05-22 DIAGNOSIS — M545 Low back pain, unspecified: Secondary | ICD-10-CM

## 2023-05-22 DIAGNOSIS — M542 Cervicalgia: Secondary | ICD-10-CM

## 2023-05-22 DIAGNOSIS — M549 Dorsalgia, unspecified: Secondary | ICD-10-CM | POA: Diagnosis not present

## 2023-05-22 DIAGNOSIS — R29898 Other symptoms and signs involving the musculoskeletal system: Secondary | ICD-10-CM

## 2023-05-22 DIAGNOSIS — R293 Abnormal posture: Secondary | ICD-10-CM
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
## 2023-05-24 ENCOUNTER — Ambulatory Visit: Payer: No Typology Code available for payment source

## 2023-05-24 DIAGNOSIS — M549 Dorsalgia, unspecified: Secondary | ICD-10-CM

## 2023-05-24 DIAGNOSIS — R293 Abnormal posture: Secondary | ICD-10-CM

## 2023-05-24 DIAGNOSIS — M542 Cervicalgia: Secondary | ICD-10-CM

## 2023-05-24 DIAGNOSIS — M545 Low back pain, unspecified: Secondary | ICD-10-CM

## 2023-05-24 DIAGNOSIS — R29898 Other symptoms and signs involving the musculoskeletal system: Secondary | ICD-10-CM
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
## 2023-06-05 ENCOUNTER — Ambulatory Visit: Payer: No Typology Code available for payment source | Admitting: Rehabilitative and Restorative Service Providers"

## 2023-06-05 ENCOUNTER — Encounter: Payer: Self-pay | Admitting: Rehabilitative and Restorative Service Providers"

## 2023-06-05 DIAGNOSIS — M545 Low back pain, unspecified: Secondary | ICD-10-CM

## 2023-06-05 DIAGNOSIS — R29898 Other symptoms and signs involving the musculoskeletal system: Secondary | ICD-10-CM

## 2023-06-05 DIAGNOSIS — R293 Abnormal posture: Secondary | ICD-10-CM

## 2023-06-05 DIAGNOSIS — M549 Dorsalgia, unspecified: Secondary | ICD-10-CM

## 2023-06-05 DIAGNOSIS — M542 Cervicalgia: Secondary | ICD-10-CM
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
## 2023-06-07 ENCOUNTER — Ambulatory Visit: Payer: No Typology Code available for payment source

## 2023-06-07 DIAGNOSIS — M549 Dorsalgia, unspecified: Secondary | ICD-10-CM | POA: Diagnosis not present

## 2023-06-07 DIAGNOSIS — R29898 Other symptoms and signs involving the musculoskeletal system: Secondary | ICD-10-CM

## 2023-06-07 DIAGNOSIS — R293 Abnormal posture: Secondary | ICD-10-CM

## 2023-06-07 DIAGNOSIS — M545 Low back pain, unspecified: Secondary | ICD-10-CM

## 2023-06-07 DIAGNOSIS — M542 Cervicalgia: Secondary | ICD-10-CM
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
## 2023-06-12 ENCOUNTER — Encounter: Payer: No Typology Code available for payment source | Admitting: Rehabilitative and Restorative Service Providers"

## 2023-06-14 ENCOUNTER — Other Ambulatory Visit: Payer: Self-pay | Admitting: Sports Medicine

## 2023-06-14 ENCOUNTER — Ambulatory Visit
Payer: No Typology Code available for payment source | Attending: Family Medicine | Admitting: Rehabilitative and Restorative Service Providers"

## 2023-06-14 ENCOUNTER — Encounter: Payer: Self-pay | Admitting: Rehabilitative and Restorative Service Providers"

## 2023-06-14 DIAGNOSIS — M549 Dorsalgia, unspecified: Secondary | ICD-10-CM | POA: Diagnosis present

## 2023-06-14 DIAGNOSIS — R293 Abnormal posture: Secondary | ICD-10-CM | POA: Diagnosis present

## 2023-06-14 DIAGNOSIS — M545 Low back pain, unspecified: Secondary | ICD-10-CM | POA: Insufficient documentation

## 2023-06-14 DIAGNOSIS — M542 Cervicalgia: Secondary | ICD-10-CM | POA: Diagnosis present

## 2023-06-14 DIAGNOSIS — G609 Hereditary and idiopathic neuropathy, unspecified: Secondary | ICD-10-CM

## 2023-06-14 DIAGNOSIS — R29898 Other symptoms and signs involving the musculoskeletal system: Secondary | ICD-10-CM | POA: Insufficient documentation
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
## 2023-06-19 ENCOUNTER — Ambulatory Visit: Payer: No Typology Code available for payment source

## 2023-06-19 DIAGNOSIS — M549 Dorsalgia, unspecified: Secondary | ICD-10-CM

## 2023-06-19 DIAGNOSIS — R293 Abnormal posture: Secondary | ICD-10-CM

## 2023-06-19 DIAGNOSIS — M542 Cervicalgia: Secondary | ICD-10-CM

## 2023-06-19 DIAGNOSIS — R29898 Other symptoms and signs involving the musculoskeletal system: Secondary | ICD-10-CM

## 2023-06-19 DIAGNOSIS — M545 Low back pain, unspecified: Secondary | ICD-10-CM
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
## 2023-06-21 ENCOUNTER — Encounter: Payer: Self-pay | Admitting: Rehabilitative and Restorative Service Providers"

## 2023-06-21 ENCOUNTER — Ambulatory Visit: Payer: No Typology Code available for payment source | Admitting: Rehabilitative and Restorative Service Providers"

## 2023-06-21 DIAGNOSIS — M545 Low back pain, unspecified: Secondary | ICD-10-CM

## 2023-06-21 DIAGNOSIS — M549 Dorsalgia, unspecified: Secondary | ICD-10-CM

## 2023-06-21 DIAGNOSIS — R29898 Other symptoms and signs involving the musculoskeletal system: Secondary | ICD-10-CM

## 2023-06-21 DIAGNOSIS — M542 Cervicalgia: Secondary | ICD-10-CM

## 2023-06-21 DIAGNOSIS — R293 Abnormal posture: Secondary | ICD-10-CM
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
## 2023-06-27 ENCOUNTER — Encounter: Payer: No Typology Code available for payment source | Admitting: Rehabilitative and Restorative Service Providers"

## 2023-06-27 ENCOUNTER — Ambulatory Visit: Payer: No Typology Code available for payment source | Admitting: Rehabilitative and Restorative Service Providers"

## 2023-06-27 ENCOUNTER — Encounter: Payer: Self-pay | Admitting: Rehabilitative and Restorative Service Providers"

## 2023-06-27 DIAGNOSIS — R293 Abnormal posture: Secondary | ICD-10-CM

## 2023-06-27 DIAGNOSIS — R29898 Other symptoms and signs involving the musculoskeletal system: Secondary | ICD-10-CM

## 2023-06-27 DIAGNOSIS — M549 Dorsalgia, unspecified: Secondary | ICD-10-CM | POA: Diagnosis not present

## 2023-06-27 DIAGNOSIS — M545 Low back pain, unspecified: Secondary | ICD-10-CM

## 2023-06-27 DIAGNOSIS — M542 Cervicalgia: Secondary | ICD-10-CM
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
## 2023-06-29 ENCOUNTER — Ambulatory Visit: Payer: No Typology Code available for payment source | Admitting: Rehabilitative and Restorative Service Providers"

## 2023-06-29 ENCOUNTER — Encounter: Payer: Self-pay | Admitting: Rehabilitative and Restorative Service Providers"

## 2023-06-29 DIAGNOSIS — M542 Cervicalgia: Secondary | ICD-10-CM

## 2023-06-29 DIAGNOSIS — M549 Dorsalgia, unspecified: Secondary | ICD-10-CM | POA: Diagnosis not present

## 2023-06-29 DIAGNOSIS — M545 Low back pain, unspecified: Secondary | ICD-10-CM

## 2023-06-29 DIAGNOSIS — R29898 Other symptoms and signs involving the musculoskeletal system: Secondary | ICD-10-CM

## 2023-06-29 DIAGNOSIS — R293 Abnormal posture: Secondary | ICD-10-CM
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
## 2023-07-04 ENCOUNTER — Encounter: Payer: No Typology Code available for payment source | Admitting: Rehabilitative and Restorative Service Providers"

## 2023-07-04 ENCOUNTER — Other Ambulatory Visit: Payer: Self-pay | Admitting: Family Medicine

## 2023-07-04 DIAGNOSIS — F419 Anxiety disorder, unspecified: Secondary | ICD-10-CM

## 2023-07-08 ENCOUNTER — Other Ambulatory Visit: Payer: Self-pay | Admitting: Internal Medicine

## 2023-07-11 ENCOUNTER — Ambulatory Visit: Payer: No Typology Code available for payment source | Admitting: Rehabilitative and Restorative Service Providers"

## 2023-07-11 ENCOUNTER — Encounter: Payer: Self-pay | Admitting: Rehabilitative and Restorative Service Providers"

## 2023-07-11 DIAGNOSIS — M549 Dorsalgia, unspecified: Secondary | ICD-10-CM | POA: Diagnosis not present

## 2023-07-11 DIAGNOSIS — R29898 Other symptoms and signs involving the musculoskeletal system: Secondary | ICD-10-CM

## 2023-07-11 DIAGNOSIS — R293 Abnormal posture: Secondary | ICD-10-CM

## 2023-07-11 DIAGNOSIS — M545 Low back pain, unspecified: Secondary | ICD-10-CM

## 2023-07-11 DIAGNOSIS — M542 Cervicalgia: Secondary | ICD-10-CM
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
## 2023-08-03 ENCOUNTER — Ambulatory Visit: Payer: No Typology Code available for payment source | Admitting: Family Medicine

## 2023-08-03 ENCOUNTER — Encounter: Payer: Self-pay | Admitting: Family Medicine

## 2023-08-03 VITALS — BP 129/83 | HR 92 | Ht 64.0 in | Wt 121.0 lb

## 2023-08-03 DIAGNOSIS — F32A Depression, unspecified: Secondary | ICD-10-CM

## 2023-08-03 DIAGNOSIS — R351 Nocturia: Secondary | ICD-10-CM | POA: Diagnosis not present

## 2023-08-03 DIAGNOSIS — N951 Menopausal and female climacteric states: Secondary | ICD-10-CM

## 2023-08-03 DIAGNOSIS — Z23 Encounter for immunization: Secondary | ICD-10-CM

## 2023-08-03 DIAGNOSIS — G609 Hereditary and idiopathic neuropathy, unspecified: Secondary | ICD-10-CM

## 2023-08-03 DIAGNOSIS — F419 Anxiety disorder, unspecified: Secondary | ICD-10-CM

## 2023-08-03 MED ORDER — BUSPIRONE HCL 15 MG PO TABS: ORAL_TABLET | ORAL | 2 refills | Status: DC

## 2023-08-03 NOTE — Assessment & Plan Note (Signed)
Continue Buspar at current dose.

## 2023-08-03 NOTE — Assessment & Plan Note (Signed)
Avoid excess fluid intake before bedtime and see if helps.  Consider may be rtriggered by poor sleep.  Consider OAB.

## 2023-08-03 NOTE — Assessment & Plan Note (Signed)
Using 1/2-1 tab nightly.

## 2023-08-03 NOTE — Assessment & Plan Note (Signed)
Starting to experience some symptoms.  Will monitor. Still having regular periods
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
## 2023-08-03 NOTE — Assessment & Plan Note (Signed)
Much improved. Has completed PT sill some occ neck pain.  Can call pt back if needs to go back for additional treatment.

## 2023-09-10 ENCOUNTER — Other Ambulatory Visit: Payer: Self-pay | Admitting: Sports Medicine

## 2023-09-10 ENCOUNTER — Encounter: Payer: Self-pay | Admitting: Family Medicine

## 2023-09-10 DIAGNOSIS — M5030 Other cervical disc degeneration, unspecified cervical region: Secondary | ICD-10-CM

## 2023-12-09 ENCOUNTER — Encounter (HOSPITAL_BASED_OUTPATIENT_CLINIC_OR_DEPARTMENT_OTHER): Payer: Self-pay

## 2023-12-09 ENCOUNTER — Ambulatory Visit
Admission: EM | Admit: 2023-12-09 | Discharge: 2023-12-09 | Disposition: A | Discharge status: Other Acute Inpatient Hospital | Payer: No Typology Code available for payment source | Attending: Family Medicine | Admitting: Family Medicine

## 2023-12-09 ENCOUNTER — Emergency Department (HOSPITAL_BASED_OUTPATIENT_CLINIC_OR_DEPARTMENT_OTHER): Payer: No Typology Code available for payment source

## 2023-12-09 ENCOUNTER — Emergency Department (HOSPITAL_COMMUNITY): Payer: No Typology Code available for payment source

## 2023-12-09 ENCOUNTER — Other Ambulatory Visit: Payer: Self-pay

## 2023-12-09 ENCOUNTER — Emergency Department (HOSPITAL_BASED_OUTPATIENT_CLINIC_OR_DEPARTMENT_OTHER)
Admission: EM | Admit: 2023-12-09 | Discharge: 2023-12-10 | Disposition: A | Discharge status: Home / Self Care | Payer: No Typology Code available for payment source | Attending: Emergency Medicine | Admitting: Emergency Medicine

## 2023-12-09 DIAGNOSIS — R079 Chest pain, unspecified: Secondary | ICD-10-CM | POA: Diagnosis not present

## 2023-12-09 DIAGNOSIS — I10 Essential (primary) hypertension: Secondary | ICD-10-CM | POA: Diagnosis not present

## 2023-12-09 DIAGNOSIS — R002 Palpitations: Secondary | ICD-10-CM

## 2023-12-09 DIAGNOSIS — R202 Paresthesia of skin: Secondary | ICD-10-CM | POA: Diagnosis present

## 2023-12-09 DIAGNOSIS — R200 Anesthesia of skin: Secondary | ICD-10-CM

## 2023-12-09 DIAGNOSIS — Z79899 Other long term (current) drug therapy: Secondary | ICD-10-CM | POA: Diagnosis not present

## 2023-12-09 HISTORY — DX: Polyneuropathy, unspecified: G62.9

## 2023-12-09 LAB — COMPREHENSIVE METABOLIC PANEL
ALT: 51 U/L — ABNORMAL HIGH (ref 0–44)
AST: 41 U/L (ref 15–41)
Albumin: 4.4 g/dL (ref 3.5–5.0)
Alkaline Phosphatase: 55 U/L (ref 38–126)
Anion gap: 9 (ref 5–15)
BUN: 13 mg/dL (ref 6–20)
CO2: 25 mmol/L (ref 22–32)
Calcium: 9.5 mg/dL (ref 8.9–10.3)
Chloride: 103 mmol/L (ref 98–111)
Creatinine, Ser: 0.84 mg/dL (ref 0.44–1.00)
GFR, Estimated: >60 mL/min (ref >60)
Glucose, Bld: 93 mg/dL (ref 70–99)
Potassium: 4.3 mmol/L (ref 3.5–5.1)
Sodium: 137 mmol/L (ref 135–145)
Total Bilirubin: 0.5 mg/dL (ref <1.2)
Total Protein: 7.4 g/dL (ref 6.5–8.1)

## 2023-12-09 LAB — CBC WITH DIFFERENTIAL/PLATELET
Abs Immature Granulocytes: 0.01 10*3/uL (ref 0.00–0.07)
Basophils Absolute: 0.1 10*3/uL (ref 0.0–0.1)
Basophils Relative: 1 %
Eosinophils Absolute: 0.2 10*3/uL (ref 0.0–0.5)
Eosinophils Relative: 3 %
HCT: 43.7 % (ref 36.0–46.0)
Hemoglobin: 14.9 g/dL (ref 12.0–15.0)
Immature Granulocytes: 0 %
Lymphocytes Relative: 36 %
Lymphs Abs: 2.2 10*3/uL (ref 0.7–4.0)
MCH: 32 pg (ref 26.0–34.0)
MCHC: 34.1 g/dL (ref 30.0–36.0)
MCV: 93.8 fL (ref 80.0–100.0)
Monocytes Absolute: 0.4 10*3/uL (ref 0.1–1.0)
Monocytes Relative: 6 %
Neutro Abs: 3.3 10*3/uL (ref 1.7–7.7)
Neutrophils Relative %: 54 %
Platelets: 266 10*3/uL (ref 150–400)
RBC: 4.66 MIL/uL (ref 3.87–5.11)
RDW: 12.1 % (ref 11.5–15.5)
WBC: 6.2 10*3/uL (ref 4.0–10.5)
nRBC: 0 % (ref 0.0–0.2)

## 2023-12-09 LAB — TROPONIN I (HIGH SENSITIVITY)
Troponin I (High Sensitivity): 3 ng/L (ref <18)
Troponin I (High Sensitivity): 3 ng/L (ref <18)

## 2023-12-09 MED ORDER — GABAPENTIN 300 MG PO CAPS
600.0000 mg | ORAL_CAPSULE | Freq: Once | ORAL | Status: AC
  Administered 2023-12-09: 600 mg via ORAL
  Filled 2023-12-09: qty 2

## 2023-12-09 NOTE — ED Provider Notes (Incomplete)
Pending 2nd trop and head CT, CXR. Then plan to transfer to [facility] for MRI brain for further workup. Physical Exam  BP (!) 132/91 (BP Location: Left Arm)   Pulse 78   Temp 98.2 F (36.8 C)   Resp 18   Ht 5\' 4"  (1.626 m)   Wt 54.9 kg   LMP 03/06/2023   SpO2 100%   BMI 20.77 kg/m   Physical Exam  Procedures  Procedures  ED Course / MDM    Medical Decision Making Amount and/or Complexity of Data Reviewed Labs: ordered. Radiology: ordered.   ***

## 2023-12-09 NOTE — Discharge Instructions (Addendum)
Advised patient to go to [facility] ED for further evaluation of chest pain to include labs and possible imaging.

## 2023-12-09 NOTE — ED Triage Notes (Addendum)
The patient stated last night at 2pm she started having left foot numbness. She has also had palpitations. At 6:45 when she woke up she was having right arm weakness. Then around 12:45 this afternoon she started having left foot and armpit tingling.  She now has tingling in both feet. She denied shortness of breath. She started HRT two weeks ago and the dr told her this could be a symptom of that.

## 2023-12-09 NOTE — ED Notes (Signed)
MRI notified of orders

## 2023-12-09 NOTE — ED Notes (Signed)
R arm tingling "inside following the bone", torso normal as well as side of face. From hip downward, same sensation of internal "feeling like her muscles want to contract" down through her knee, to her feet through her toes. No topical sensation changes, all the feelings are internal.

## 2023-12-09 NOTE — ED Provider Notes (Signed)
  Physical Exam  BP 125/84   Pulse 82   Temp 98.1 F (36.7 C) (Oral)   Resp 17   Ht 5\' 4"  (1.626 m)   Wt 54.9 kg   LMP 03/06/2023   SpO2 100%   BMI 20.77 kg/m   Physical Exam  Procedures  Procedures  ED Course / MDM   Clinical Course as of 12/09/23 2251  Sat Dec 09, 2023  1836 Dr Lyman Speller neuro [JB]    Clinical Course User Index [JB] Barrett, Horald Chestnut, PA-C   Medical Decision Making Amount and/or Complexity of Data Reviewed Labs: ordered. Radiology: ordered.  Risk Prescription drug management.   ***  This patient was received in transfer from [facility] for MRI with and without of the brain for vague migratory numbness and heaviness.  Also palpitations which were worked up in the preceding ED without acute Cardiologic findings.  CT with changes ? demyelinating disease, received in transfer for MRI.

## 2023-12-09 NOTE — ED Notes (Signed)
Patient is being discharged from the Urgent Care and sent to the Emergency Department via POV with husband . Per ragan.FNP, patient is in need of higher level of care due to palpitations, cp and leg numbness. Patient is aware and verbalizes understanding of plan of care.  Vitals:   12/09/23 1409 12/09/23 1411  BP:  (!) 139/93  Pulse: 80   Resp: 19   Temp: 98.2 F (36.8 C)   SpO2: 98%

## 2023-12-09 NOTE — ED Provider Notes (Cosign Needed)
[facility] Provider Note   CSN: [id number] Arrival date & time: 12/09/23  1441     History  Chief Complaint  Patient presents with   Palpitations   Tingling    Melinda Parsons is a 52 y.o. female with past medical history of hypertension, neuropathy, migraine, GERD presenting to emergency room today with multiple complaints.  Patient reports that she started having some right foot numbness and palpitations around 2 AM today, numbness slowly progressed up to her right leg to her hip by 6 AM and made her right leg feel heavier than her left leg.  Around noon she noticed some right arm numbness and weakness, which has now improved.  Around the same time she started experiencing left foot numbness and a sharp pain in her left armpit.  Patient was sent here from urgent care due to complaint of palpitations and concerned this was coming from her heart.  Patient denies any chest pain shortness of breath or difficulty breathing.  Patient reports her symptoms have significantly improved since arriving here however does still have some residual right-sided "heaviness" and numbess. No blurry vision, change in vision, aphasia.    Palpitations      Home Medications Prior to Admission medications   Medication Sig Start Date End Date Taking? Authorizing Provider  atorvastatin (LIPITOR) 10 MG tablet Take 1 tablet (10 mg total) by mouth daily. 04/12/23   Charlton Amor, DO  azelastine (ASTELIN) 0.1 % nasal spray Place 2 sprays into both nostrils 2 (two) times daily. Use in each nostril as directed 04/30/22   Trevor Iha, FNP  busPIRone (BUSPAR) 15 MG tablet TAKE 1 TABLET BY MOUTH 3 TIMES DAILY. PATIENT TAKES 15 MG THREE TIMES A DAY. OK TO TAKE EXTRA TAB ONCE A DAY IF NEEDED. 08/03/23   Agapito Games, MD  cyanocobalamin (VITAMIN B12) 1000 MCG tablet Take 1 tablet (1,000 mcg total) by mouth daily. 09/02/22   Monica Becton, MD  estradiol  (VIVELLE-DOT) 0.05 MG/24HR patch Place 1 patch onto the skin 2 (two) times a week. 12/01/23   [provider]  famotidine (PEPCID) 20 MG tablet One after supper 04/13/23   Nyoka Cowden, MD  gabapentin (NEURONTIN) 600 MG tablet TAKE 1 TABLET BY MOUTH AT BEDTIME. 09/11/23   Monica Becton, MD  hyoscyamine (LEVBID) 0.375 MG 12 hr tablet Take 0.375 mg by mouth 2 (two) times daily. She is taking 1 tablet daily    [provider]  NUVARING 0.12-0.015 MG/24HR vaginal ring INSERT ONE RING VAGINALLY ONCE A MONTH AS DIRECTED BY PHYSICIAN 11/15/17   [provider]  thiamine (VITAMIN B1) 100 MG tablet TAKE 1 TABLET BY MOUTH EVERY DAY 06/14/23   Monica Becton, MD  Topiramate ER (TROKENDI XR) 100 MG CP24 TAKE 1 CAPSULE BY MOUTH EVERY DAY 05/10/23   Agapito Games, MD  tretinoin (RETIN-A) 0.025 % cream SMARTSIG:Sparingly Topical Every Night 01/08/22   [provider]      Allergies    Patient has no known allergies.    Review of Systems   Review of Systems  Cardiovascular:  Positive for palpitations.    Physical Exam Updated Vital Signs BP (!) 132/91 (BP Location: Left Arm)   Pulse 78   Temp 98.2 F (36.8 C)   Resp 16   Ht 5\' 4"  (1.626 m)   Wt 54.9 kg   LMP 03/06/2023   SpO2 100%   BMI 20.77 kg/m  Physical Exam Vitals and nursing note reviewed.  Constitutional:      General: She is not in acute distress.    Appearance: She is not toxic-appearing.  HENT:     Head: Normocephalic and atraumatic.  Eyes:     General: No scleral icterus.    Extraocular Movements: Extraocular movements intact.     Conjunctiva/sclera: Conjunctivae normal.     Pupils: Pupils are equal, round, and reactive to light.  Cardiovascular:     Rate and Rhythm: Normal rate and regular rhythm.     Pulses: Normal pulses.     Heart sounds: Normal heart sounds.  Pulmonary:     Effort: Pulmonary effort is normal. No respiratory distress.     Breath sounds: Normal  breath sounds.  Abdominal:     General: Abdomen is flat. Bowel sounds are normal.     Palpations: Abdomen is soft.     Tenderness: There is no abdominal tenderness.  Musculoskeletal:     Right lower leg: No edema.     Left lower leg: No edema.     Comments: Localized pain to left armpit. No tenderness to palpation (reports not having pain at this time), moving extremity without difficulty.   Skin:    General: Skin is warm and dry.     Capillary Refill: Capillary refill takes less than 2 seconds.     Findings: No lesion.  Neurological:     General: No focal deficit present.     Mental Status: She is alert and oriented to person, place, and time. Mental status is at baseline.     Cranial Nerves: No cranial nerve deficit.     Sensory: No sensory deficit.     Motor: No weakness.     Coordination: Coordination normal.     Gait: Gait normal.     Comments: Patient has subjective heaviness of right lower leg and right upper leg.  On exam upper and lower strength appears equal bilaterally, sensation appears equal bilaterally strong pulse. No ataxia. Alert, oriented questions appropriately with no slurred speech.  No nystagmus, no ataxia.  Psychiatric:        Mood and Affect: Mood normal.        Behavior: Behavior normal.        Thought Content: Thought content normal.        Judgment: Judgment normal.     ED Results / Procedures / Treatments   Labs (all labs ordered are listed, but only abnormal results are displayed) Labs Reviewed  COMPREHENSIVE METABOLIC PANEL - Abnormal; Notable for the following components:      Result Value   ALT 51 (*)    All other components within normal limits  CBC WITH DIFFERENTIAL/PLATELET  TROPONIN I (HIGH SENSITIVITY)  TROPONIN I (HIGH SENSITIVITY)    EKG EKG Interpretation Date/Time:  Saturday December 09 2023 14:50:05 EST Ventricular Rate:  87 PR Interval:  146 QRS Duration:  91 QT Interval:  382 QTC Calculation: 460 R Axis:   91  Text  Interpretation: Sinus rhythm Anteroseptal infarct, age indeterminate Confirmed by Vanetta Mulders [phone]) on 12/09/2023 4:54:22 PM  Radiology DG Chest Portable 1 View Result Date: 12/09/2023 CLINICAL DATA:  CP EXAM: PORTABLE CHEST - 1 VIEW COMPARISON:  04/01/2023. FINDINGS: Cardiac silhouette is unremarkable. No pneumothorax or pleural effusion. The lungs are clear. Aorta is calcified. The visualized skeletal structures are unremarkable. IMPRESSION: No acute cardiopulmonary process. Electronically Signed   By: Layla Maw M.D.   On: 12/09/2023 18:39  Procedures Procedures    Medications Ordered in ED Medications - No data to display  ED Course/ Medical Decision Making/ A&P Clinical Course as of 12/09/23 1852  Sat Dec 09, 2023  1836 Dr Lyman Speller neuro [JB]    Clinical Course User Index [JB] Asra Gambrel, Horald Chestnut, PA-C                                 Medical Decision Making Amount and/or Complexity of Data Reviewed Labs: ordered. Radiology: ordered.   Grayci Kelemen 52 y.o. presented today for palpitations and neurological changes. Working Ddx: tension headache, migraine, intracranial mass, intracranial hemorrhage, intracranial infection including meningitis vs encephalitis, trigeminal neuralgia, AVM, sinusitis, cerebral aneurysm, muscular headache, cavernous sinus thrombosis, carotid artery dissection.   PMHX: hypertension, neuropathy, migraine, GERD  Review of prior external notes: 12/09/23  Unique Tests and My Interpretation:  CBC: No leukocytosis, no anemia CMP: No electrolyte abnormality, glucose 93 Troponin: 3, repeat trop PENDING   CT Head w/o Contrast: PENDING  Chest x-ray: negative  MR w/ and w/o brain ordered, needs transferred.    Consulted neurology in regards to patient presentation who recommended transfer to [facility].  Recommends brain MRI with and without contrast primarily for acute stroke workup.  Okay to be discharged with outpatient follow-up  with neurology if no findings on MRI.   Problem List / ED Course / Critical interventions / Medication management  Patient reporting to emergency room with complaint of palpitations and extremity tingling. No arrhythmia on cardiac monitor, nor EKG. No chest pain thus doubt ACS or dissection as cause. No chest pain nor SOB thus doubt PE. However, will get chest x-ray and Trop to rule out. Patient reports she had right foot numbness which then progressed into right upper extremity weakness and armpit tingling.  Patient symptoms have currently improved however she reports earlier her right arm felt very weak and she could not move it well.  Denies any headache or significant change in vision.  On exam she has no focal neurological deficits ,pupils are equal and reactive. with no nystagmus. Ambulatory, no ataxia. Patient is hemodynamically stable, oxygenating well.  Lab work so far has been unremarkable first troponin was 3, getting repeat.  EKG without significant abnormality. Patient is on cardiac monitor. Will obtain chest x-ray and head CT. Discussed findings with attending Dr. Deretha Emory who recommend it speaking with neurology. Neuro recommend workup and transfer to [facility] for MR. I ordered MRI, patient can be discharge home with outpatient neuro follow up if not abnormal.    Patients vitals assessed. Upon arrival patient is hemodynamically stable.  I have reviewed the patients home medicines and have made adjustments as needed  Will consult neurology in regards to patient's symptoms   Plan:   Transferred to [facility] for MR brain with and without.          Final Clinical Impression(s) / ED Diagnoses Final diagnoses:  None    Rx / DC Orders ED Discharge Orders     None         Smitty Knudsen, PA-C 12/09/23 2029

## 2023-12-09 NOTE — Discharge Instructions (Addendum)
You were seen in the ER today for the changes in sensation in your extremities. Your workup was reassuring.  Please follow-up with the neurologist listed below and return to the ER with any severe symptoms.

## 2023-12-09 NOTE — ED Triage Notes (Signed)
Transfer from facility after having R foot numbness and R arm numbness around 1400 yesterday. Completed a CT at previous facility and is here for MRI. VSS.

## 2023-12-09 NOTE — ED Triage Notes (Addendum)
[facility] presents to [facility] with co of palpitations and right foot numbness since 2 am. Pt reports he right side feels like jello to her. Pt reports pain In left arm pit  and light headedness for one hour.   Pt reports she was recently placed on hormone replacement therapy about two weeks ago and has hx of neuropathy.

## 2023-12-09 NOTE — ED Notes (Signed)
[facility] called for transport. 
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
## 2023-12-09 NOTE — ED Notes (Signed)
[facility] 5 min ETA

## 2023-12-10 MED ORDER — GADOBUTROL 1 MMOL/ML IV SOLN
5.0000 mL | Freq: Once | INTRAVENOUS | Status: AC | PRN
  Administered 2023-12-10: 5 mL via INTRAVENOUS

## 2023-12-14 ENCOUNTER — Encounter: Payer: Self-pay | Admitting: Family Medicine

## 2023-12-14 DIAGNOSIS — R202 Paresthesia of skin: Secondary | ICD-10-CM

## 2023-12-18 ENCOUNTER — Institutional Professional Consult (permissible substitution): Payer: No Typology Code available for payment source | Admitting: Neurology

## 2023-12-18 NOTE — Progress Notes (Signed)
 Initial neurology clinic note  Reason for Evaluation: Consultation requested by Alvan Dorothyann BIRCH, * for an opinion regarding intermittent numbness in arms and legs. My final recommendations will be communicated back to the requesting physician by way of shared medical record or letter to requesting physician via [place]  mail.  HPI: This is Ms. Melinda Parsons, a 53 y.o. left-handed female with a medical history of HTN, HLD, migraine, GERD who presents to neurology clinic with the chief complaint of numbness and tingling in arms and legs. The patient is alone today.  Patient has had right leg numbness that intermittent for a few months. She has consistent tingling in that foot. She states her leg feels like jelly and weak. When she wakes up, she feels like the right leg is dead. She has to rub it and get it warm to get it feeling more normal. She started having similar, but less severe, symptoms in the left foot over the past week.   Patient went to ED on 12/09/23 for right foot numbness and palpitations that were progressive throughout the day that progressed to right arm. Symptoms improved then were on the left. CT head and MRI brain showed no acute process.  She mentions a history of neck pain and also paresthesias in the right arm. This has gotten better with only rare symptoms of tingling in RUE. Of note, patient was seen by Dr. Onita in 2016 for migraines and paresthesias. She had an EMG of the RUE in 2016 that was normal.  She also has a history of migraines. This was associated with vision loss in the past. Currently, her face (bilaterally) and mouth will go numb. She denies head pain with these. It lasts about 15 minutes and stops. She takes topiramate  100 mg XR daily (has been for years). This has helped her symptoms. She currently has about 1 silent migraine per week. She does not have a rescue medication. She will get lines in her vision at times with no other symptoms or with face/mouth  numbness.   She does have headaches when questioned further. The pain is above her eyes. It is a constant pain that she has difficulty describing. She endorses photophobia, phonophobia, and nausea. She wants to go in a cold dark room when they occur. They can last 1 to 3 days. She does not think the paresthesias occur with the headaches. She tends to get 1 of these per month around her menstrual cycle. She is now going through menopause.  She has significant stress currently. She is taking care of her mother with cancer, career change, and daughter who has severe depression. She denies any significant depression. She endorses anxiety for which she takes buspar , which helps her manage.  Regarding her sleep, she sleeps about 5 hours per night. She states her dog wakes her up and then she cannot return to sleep. She does not feel well rested and is tired through the day. She can nap during the day if able. She does not snore.   She does not report any constitutional symptoms like fever, night sweats, anorexia or unintentional weight loss.  She takes gabapentin  600 mg at bedtime. She has been on it for years and helps with tingling.  She is a never smoker. EtOH use: 1 glass of wine per night, has stopped since 2025 Restrictive diet? No Family history of neuropathy/myopathy/neurologic disease? Father with neuropathy  She takes B1 and B12, for about 1 year.  MEDICATIONS:  Outpatient Encounter Medications as  of 12/20/2023  Medication Sig   azelastine  (ASTELIN ) 0.1 % nasal spray Place 2 sprays into both nostrils 2 (two) times daily. Use in each nostril as directed   busPIRone  (BUSPAR ) 15 MG tablet TAKE 1 TABLET BY MOUTH 3 TIMES DAILY. PATIENT TAKES 15 MG THREE TIMES A DAY. OK TO TAKE EXTRA TAB ONCE A DAY IF NEEDED.   cyanocobalamin  (VITAMIN B12) 1000 MCG tablet Take 1 tablet (1,000 mcg total) by mouth daily.   estradiol (VIVELLE-DOT) 0.05 MG/24HR patch Place 1 patch onto the skin 2 (two) times a week.    famotidine  (PEPCID ) 20 MG tablet One after supper (Patient taking differently: Take 20 mg by mouth as needed. One after supper)   gabapentin  (NEURONTIN ) 600 MG tablet TAKE 1 TABLET BY MOUTH AT BEDTIME.   hyoscyamine (LEVBID) 0.375 MG 12 hr tablet Take 0.375 mg by mouth 2 (two) times daily. She is taking 1 tablet daily   thiamine  (VITAMIN B1) 100 MG tablet TAKE 1 TABLET BY MOUTH EVERY DAY   Topiramate  ER (TROKENDI  XR) 100 MG CP24 TAKE 1 CAPSULE BY MOUTH EVERY DAY   tretinoin (RETIN-A) 0.025 % cream SMARTSIG:Sparingly Topical Every Night   atorvastatin  (LIPITOR) 10 MG tablet Take 1 tablet (10 mg total) by mouth daily.   NUVARING 0.12-0.015 MG/24HR vaginal ring INSERT ONE RING VAGINALLY ONCE A MONTH AS DIRECTED BY PHYSICIAN   No facility-administered encounter medications on file as of 12/20/2023.    PAST MEDICAL HISTORY: Past Medical History:  Diagnosis Date   Anxiety    GERD (gastroesophageal reflux disease)    Hypertension    IBS (irritable bowel syndrome)    Lumbar herniated disc    Migraine    Neuropathy    Pre-eclampsia    with daughter   PVC (premature ventricular contraction)    Previous holter in Dec 2011 showed PVCs and 2 3 beat runs of SVT   Seasonal allergies     PAST SURGICAL HISTORY: Past Surgical History:  Procedure Laterality Date   NO PAST SURGERIES      ALLERGIES: No Known Allergies  FAMILY HISTORY: Family History  Problem Relation Age of Onset   Hypertension Mother    Hyperlipidemia Mother    Breast cancer Mother 57   Diverticulitis Father    Hypertension Other        grandmother   Alcohol abuse Other        grandparent    SOCIAL HISTORY: Social History   Tobacco Use   Smoking status: Never   Smokeless tobacco: Never  Vaping Use   Vaping status: Never Used  Substance Use Topics   Alcohol use: Not Currently    Alcohol/week: 8.0 - 9.0 standard drinks of alcohol    Types: 7 Glasses of wine, 1 - 2 Standard drinks or equivalent per week    Drug use: No   Social History   Social History Narrative   Lives at home with her husband and daughter.   Left-handed.   4-6 cups caffeine per day.   Leadership development     OBJECTIVE: PHYSICAL EXAM: BP 127/88 (Cuff Size: Normal)   Pulse 90   Ht 5' 3 (1.6 m)   Wt 120 lb (54.4 kg)   LMP 03/06/2023   SpO2 99%   BMI 21.26 kg/m   General: General appearance: Awake and alert. No distress. Cooperative with exam.  Skin: No obvious rash or jaundice. HEENT: Atraumatic. Anicteric. Fundoscopy: Temporal margins clear, nasal margins not well seen. No definitive papilledema. Lungs:  Non-labored breathing on room air  Extremities: No edema. No obvious deformity.  Musculoskeletal: No obvious joint swelling. Psych: Affect appropriate.  Neurological: Mental Status: Alert. Speech fluent. No pseudobulbar affect Cranial Nerves: CNII: No RAPD. Visual fields grossly intact. CNIII, IV, VI: PERRL. No nystagmus. EOMI. CN V: Facial sensation intact bilaterally to fine touch. CN VII: Facial muscles symmetric and strong. No ptosis at rest. CN VIII: Hearing grossly intact bilaterally. CN IX: No hypophonia. CN X: Palate elevates symmetrically. CN XI: Full strength shoulder shrug bilaterally. CN XII: Tongue protrusion full and midline. No atrophy or fasciculations. No significant dysarthria Motor: Tone is normal. Strength 5/5 in bilateral upper and lower extremities. Reflexes:  Right Left   Bicep 1+ 1+   Tricep 1+ 1+   BrRad 1+ 1+   Knee 2+ 2+   Ankle Trace Trace    Pathological Reflexes: Babinski: flexor response bilaterally Hoffman: absent bilaterally Troemner: absent bilaterally Sensation: Pinprick: Intact in all extremities Vibration: Intact in all extremities Proprioception: Intact in bilateral great toes. Coordination: Intact finger-to- nose-finger bilaterally. Romberg negative. Gait: Able to rise from chair with arms crossed unassisted. Normal, narrow-based gait.  Lab  and Test Review: Internal labs: 12/09/23: CMP unremarkable CBC w/ diff unremarkable  09/30/22: MMA: 80 (normal 87-318) B12: 638  08/05/22: TSH wnl Lipid panel: tChol 165, LDL 74, TG 99 Zinc  wnl Copper  wnl ANCA negative B1 low at 7 HbA1c: 4.6  Imaging: MRI brain w/wo contrast (12/09/23): FINDINGS: Brain: No restricted diffusion to suggest acute or subacute infarct. No abnormal parenchymal or meningeal enhancement.   No acute hemorrhage, mass, mass effect, or midline shift. No hydrocephalus or extra-axial collection. Pituitary and craniocervical junction within normal limits.   No hemosiderin deposition to suggest remote hemorrhage. Normal cerebral volume for age. Scattered T2 hyperintense signal in the periventricular white matter, which have increased in number compared to 2016, likely the sequela of mild-to-moderate chronic small vessel ischemic disease. No evidence of remote cortical or lacunar infarct.   Vascular: Normal arterial flow voids. Normal arterial and venous enhancement.   Skull and upper cervical spine: Normal marrow signal.   Sinuses/Orbits: Right maxillary mucous retention cyst. Mucosal thickening in the ethmoid air cells. No acute finding in the orbits.   Other: The mastoid air cells are well aerated.   IMPRESSION: No acute intracranial process. No evidence of acute or subacute infarct.  Thoracic spine xray (04/20/23): FINDINGS: There is no evidence of thoracic spine fracture. Alignment is normal. No other significant bone abnormalities are identified.   IMPRESSION: Negative.  Lumbar spine xray (04/11/23): FINDINGS: There are 5 non-rib-bearing lumbar-type vertebral bodies. Vertebral body heights are preserved, without evidence of acute injury. Alignment is normal. There is no evidence of spondylolysis.   The disc heights are overall preserved. There is no significant degenerative change.   IMPRESSION: No evidence of acute injury in the  lumbar spine.  Cervical spine xray (04/11/23): FINDINGS: The cervical spine is imaged through the C7 vertebral body in the lateral projection. Vertebral body heights are preserved, without definite evidence of acute injury. Alignment is normal. There is mild disc space narrowing and degenerative endplate change C5-C6. There is moderate right neural foraminal stenosis C5-C6. There is no other significant osseous neural foraminal stenosis   The prevertebral soft tissues are unremarkable.   IMPRESSION: 1. No definite evidence of acute injury in the cervical spine. 2. Degenerative changes at C5-C6 with osseous right neural foraminal stenosis.  MRI cervical spine wo contrast (07/12/20): FINDINGS: Alignment: Straightening of the  normal cervical lordosis. Trace 2 mm retrolisthesis of C5 on C6.   Vertebrae: Vertebral body height maintained without evidence for acute or chronic fracture. Bone marrow signal intensity within normal limits without discrete or worrisome osseous lesions. Mild discogenic reactive endplate changes present about the C5-6 and C6-7 interspaces. No abnormal marrow edema.   Cord: Normal signal and morphology.   Posterior Fossa, vertebral arteries, paraspinal tissues: Visualized brain and posterior fossa within normal limits. Craniocervical junction normal. Paraspinous and prevertebral soft tissues within normal limits. Normal intravascular flow voids seen within the vertebral arteries bilaterally.   Disc levels:   C2-C3: Unremarkable.   C3-C4: Small central disc protrusion indents the ventral thecal sac (series 8, image 18). No spinal stenosis or cord deformity. Superimposed mild left-sided facet degeneration. No foraminal encroachment.   C4-C5: Mild disc bulge with uncovertebral spurring. No significant spinal stenosis. Foramina remain patent.   C5-C6: Chronic intervertebral disc space narrowing with diffuse degenerative disc osteophyte. Broad posterior  component flattens and partially faces the ventral thecal sac resultant mild spinal stenosis. Mild flattening of the ventral spinal cord. Moderate to severe right with moderate left C6 foraminal narrowing.   C6-C7: Shallow broad-based right paracentral disc protrusion mildly indents the ventral thecal sac (series 8, image 18). Mild spinal stenosis without cord impingement. Foramina remain patent.   C7-T1: Minimal facet hypertrophy. Otherwise unremarkable. No stenosis.   Visualized upper thoracic spine demonstrates no significant finding.   IMPRESSION: 1. Degenerative disc osteophyte at C5-6 with resultant mild canal with moderate to severe right worse than left C6 foraminal stenosis. 2. Small right paracentral disc protrusion at C6-7 with resultant mild spinal stenosis but no cord impingement.  EMG (09/29/2015 - Dr. Onita at [facility]): FINDINGS: NERVE CONDUCTION STUDY: Right peroneal sensory response was normal. Bilateral median, ulnar sensory and motor responses were normal. Right peroneal to EDB, right tibial motor responses were normal. Right tibial H reflexe was present.   NEEDLE ELECTROMYOGRAPHY: Selected needle examination was performed at right upper extremity, and right cervical paraspinal muscles.   Needle examination of right extensor digitorum communis, biceps, triceps, deltoid, pronator teres, first dorsal interossei was normal.   There was no spontaneous activity at right cervical paraspinal muscles, right C5, 6, 7.   IMPRESSION:  This is a normal study. There was no electrodiagnostic evidence of right upper extremity neuropathy, or right cervical radiculopathy.  ASSESSMENT: Melinda Parsons is a 53 y.o. female who presents for evaluation of numbness and tingling in right greater than left foot. She has a relevant medical history of HTN, HLD, migraine, GERD. Her neurological examination is pertinent for mildly diminished reflexes. Available diagnostic data is significant for  MRI cervical spine in 2021 that showed moderate to severe right C6 neural foraminal stenosis. This could be the cause of her prior right arm symptoms. Her MRI brain from 12/09/23 shows white matter abnormalities as can be seen in chronic microvascular ischemia or migraines, but also demyelinating disease. Given the history of vision loss, this is a consideration, though there are not clear signs on exam today of a central process. She also has a history of migraines with odd symptoms such as numbness of face and mouth, so this is another possible cause though not clearly the case given that the symptoms do not correlate to headaches or other symptoms. I will start by getting labs to look for treatable causes and an EMG to look for a peripheral process such as lumbosacral radiculopathy. I may expand the work up if needed.  PLAN: -Blood work: B1, IFE, CMP -EMG (R > L LE) -Continue Topiramate  100 mg. May consider changing if no other etiology is found -Discussed the importance of sleep and mood to cognition -Will likely repeat MRI brain w/wo contrast in about 6 months  -Return to clinic in 3 months  The impression above as well as the plan as outlined below were extensively discussed with the patient who voiced understanding. All questions were answered to their satisfaction.  When available, results of the above investigations and possible further recommendations will be communicated to the patient via telephone/MyChart. Patient to call office if not contacted after expected testing turnaround time.   Total time spent reviewing records, interview, history/exam, documentation, and coordination of care on day of encounter:  70 min   Thank you for allowing me to participate in patient's care.  If I can answer any additional questions, I would be pleased to do so.  Venetia Potters, MD   CC: Alvan Dorothyann BIRCH, MD [address] [place] [facility] [place] [address]  CC: Referring  provider: Alvan Dorothyann BIRCH, MD [address] [place],  [place] [address]

## 2023-12-20 ENCOUNTER — Encounter: Payer: Self-pay | Admitting: Neurology

## 2023-12-20 ENCOUNTER — Ambulatory Visit: Payer: No Typology Code available for payment source | Admitting: Neurology

## 2023-12-20 ENCOUNTER — Other Ambulatory Visit: Payer: No Typology Code available for payment source

## 2023-12-20 VITALS — BP 127/88 | HR 90 | Ht 63.0 in | Wt 120.0 lb

## 2023-12-20 DIAGNOSIS — M542 Cervicalgia: Secondary | ICD-10-CM

## 2023-12-20 DIAGNOSIS — M5412 Radiculopathy, cervical region: Secondary | ICD-10-CM | POA: Diagnosis not present

## 2023-12-20 DIAGNOSIS — E519 Thiamine deficiency, unspecified: Secondary | ICD-10-CM

## 2023-12-20 DIAGNOSIS — R200 Anesthesia of skin: Secondary | ICD-10-CM

## 2023-12-20 DIAGNOSIS — R209 Unspecified disturbances of skin sensation: Secondary | ICD-10-CM | POA: Diagnosis not present

## 2023-12-20 DIAGNOSIS — Z72820 Sleep deprivation: Secondary | ICD-10-CM

## 2023-12-20 DIAGNOSIS — G43109 Migraine with aura, not intractable, without status migrainosus: Secondary | ICD-10-CM

## 2023-12-20 DIAGNOSIS — R202 Paresthesia of skin: Secondary | ICD-10-CM

## 2023-12-20 NOTE — Patient Instructions (Addendum)
 I saw you today for the numbness and tingling in your feet (right more than left). I'm not sure the cause of your symptoms.  I want to investigate with: -Blood work today -EMG (muscle and nerve test) of your legs  We will discuss results and next steps when I have them.  We discussed the importance of sleep and mood in cognition and overall well being today. Work on improving sleep.  Continue topiramate  100 mg daily for now for migraines.  We will likely repeat your MRI brain in about 6 months.  I will see you back in clinic in about 3 months.  The physicians and staff at [facility] are committed to providing excellent care. You may receive a survey requesting feedback about your experience at our office. We strive to receive very good responses to the survey questions. If you feel that your experience would prevent you from giving the office a very good  response, please contact our office to try to remedy the situation. We may be reached at [phone]. Thank you for taking the time out of your busy day to complete the survey.  Venetia Potters, MD [facility]  ELECTROMYOGRAM AND NERVE CONDUCTION STUDIES (EMG/NCS) INSTRUCTIONS  How to Prepare The neurologist conducting the EMG will need to know if you have certain medical conditions. Tell the neurologist and other EMG lab personnel if you: Have a pacemaker or any other electrical medical device Take blood-thinning medications Have hemophilia, a blood-clotting disorder that causes prolonged bleeding Bathing Take a shower or bath shortly before your exam in order to remove oils from your skin. Don't apply lotions or creams before the exam.  What to Expect You'll likely be asked to change into a hospital gown for the procedure and lie down on an examination table. The following explanations can help you understand what will happen during the exam.  Electrodes. The neurologist or a technician places surface electrodes at  various locations on your skin depending on where you're experiencing symptoms. Or the neurologist may insert needle electrodes at different sites depending on your symptoms.  Sensations. The electrodes will at times transmit a tiny electrical current that you may feel as a twinge or spasm. The needle electrode may cause discomfort or pain that usually ends shortly after the needle is removed. If you are concerned about discomfort or pain, you may want to talk to the neurologist about taking a short break during the exam.  Instructions. During the needle EMG, the neurologist will assess whether there is any spontaneous electrical activity when the muscle is at rest - activity that isn't present in healthy muscle tissue - and the degree of activity when you slightly contract the muscle.  He or she will give you instructions on resting and contracting a muscle at appropriate times. Depending on what muscles and nerves the neurologist is examining, he or she may ask you to change positions during the exam.  After your EMG You may experience some temporary, minor bruising where the needle electrode was inserted into your muscle. This bruising should fade within several days. If it persists, contact your primary care doctor.

## 2023-12-26 ENCOUNTER — Encounter: Payer: Self-pay | Admitting: Neurology

## 2023-12-26 LAB — COMPREHENSIVE METABOLIC PANEL
AG Ratio: 1.9 (calc) (ref 1.0–2.5)
ALT: 40 U/L — ABNORMAL HIGH (ref 6–29)
AST: 27 U/L (ref 10–35)
Albumin: 4.9 g/dL (ref 3.6–5.1)
Alkaline phosphatase (APISO): 61 U/L (ref 37–153)
BUN: 14 mg/dL (ref 7–25)
CO2: 25 mmol/L (ref 20–32)
Calcium: 9.8 mg/dL (ref 8.6–10.4)
Chloride: 104 mmol/L (ref 98–110)
Creat: 0.99 mg/dL (ref 0.50–1.03)
Globulin: 2.6 g/dL (ref 1.9–3.7)
Glucose, Bld: 80 mg/dL (ref 65–99)
Potassium: 4.1 mmol/L (ref 3.5–5.3)
Sodium: 140 mmol/L (ref 135–146)
Total Bilirubin: 0.7 mg/dL (ref 0.2–1.2)
Total Protein: 7.5 g/dL (ref 6.1–8.1)

## 2023-12-26 LAB — IMMUNOFIXATION ELECTROPHORESIS
IgM, Serum: 59 mg/dL (ref 50–300)
IgM, Serum: 1083 mg/dL (ref 600–300)
Immunoglobulin A: 1083 mg/dL (ref 600–310)
Immunoglobulin A: 253 mg/dL (ref 47–310)

## 2023-12-26 LAB — VITAMIN B1: Vitamin B1 (Thiamine): 161 nmol/L — ABNORMAL HIGH (ref 8–30)

## 2024-01-15 ENCOUNTER — Telehealth: Payer: Self-pay | Admitting: Neurology

## 2024-01-15 ENCOUNTER — Ambulatory Visit: Payer: No Typology Code available for payment source | Admitting: Neurology

## 2024-01-15 DIAGNOSIS — R202 Paresthesia of skin: Secondary | ICD-10-CM

## 2024-01-15 DIAGNOSIS — R209 Unspecified disturbances of skin sensation: Secondary | ICD-10-CM | POA: Diagnosis not present

## 2024-01-15 DIAGNOSIS — E519 Thiamine deficiency, unspecified: Secondary | ICD-10-CM

## 2024-01-15 DIAGNOSIS — M542 Cervicalgia: Secondary | ICD-10-CM

## 2024-01-15 DIAGNOSIS — Z72820 Sleep deprivation: Secondary | ICD-10-CM

## 2024-01-15 DIAGNOSIS — M5412 Radiculopathy, cervical region: Secondary | ICD-10-CM

## 2024-01-15 DIAGNOSIS — G43109 Migraine with aura, not intractable, without status migrainosus: Secondary | ICD-10-CM

## 2024-01-15 NOTE — Procedures (Signed)
[facility]  [address] [place], [address]  [place], [place] [address] Tel: [phone] Fax: [phone] Test Date:  01/15/2024  Patient: Melinda Parsons DOB: 17-Jan-1971 Physician: Jacquelyne Balint, MD  Sex: Female Height: 5\' 3"  Ref Phys: Jacquelyne Balint, MD  ID#: [id number]   Technician:    History: This is a 53 year old female with numbness and tingling in her feet.  NCV & EMG Findings: Extensive electrodiagnostic evaluation of bilateral lower limbs shows: Bilateral sural, bilateral superficial peroneal/fibular, and right medial plantar sensory responses are within normal limits. Bilateral peroneal/fibular (EDB) and tibial (AH) motor responses are within normal limits. Bilateral H reflex latency is within normal limits. There is no evidence of active or chronic motor axon loss changes affecting any of the tested muscles. Motor unit configuration and recruitment pattern is within normal limits.  Impression: This is a normal study. In particular, there is no electrodiagnostic evidence of a right or left lumbosacral (L3-S1) radiculopathy, large fiber sensorimotor neuropathy, or myopathy.    ___________________________ Jacquelyne Balint, MD    Nerve Conduction Studies Motor Nerve Results    Latency Amplitude F-Lat Segment Distance CV Comment  Site (ms) Norm (mV) Norm (ms)  (cm) (m/s) Norm   Left Fibular (EDB) Motor  Ankle 5.3  < 6.0 3.9  > 2.5        Bel fib head 11.8 - 3.7 -  Bel fib head-Ankle 30 46  > 40   Pop fossa 14.0 - 3.6 -  Pop fossa-Bel fib head 9 41 -   Right Fibular (EDB) Motor  Ankle 3.7  < 6.0 5.7  > 2.5        Bel fib head 9.9 - 5.5 -  Bel fib head-Ankle 28 45  > 40   Pop fossa 11.8 - 5.0 -  Pop fossa-Bel fib head 9 47 -   Left Tibial (AH) Motor  Ankle 3.9  < 6.0 17.3  > 4.0        Knee 11.1 - 13.0 -  Knee-Ankle 38 53  > 40   Right Tibial (AH) Motor  Ankle 5.5  < 6.0 18.0  > 4.0        Knee 12.5 - 14.1 -  Knee-Ankle 39 56  > 40    Sensory Sites    Neg Peak  Lat Amplitude (O-P) Segment Distance Velocity Comment  Site (ms) Norm (V) Norm  (cm) (ms)   Right Medial Plantar (Ortho) Sensory  Great toe-Med mall 3.2 - 5 - Great toe-Med mall -    Left Superficial Fibular Sensory  14 cm-Ankle 2.7  < 4.6 7  > 4 14 cm-Ankle 14    Right Superficial Fibular Sensory  14 cm-Ankle 2.6  < 4.6 11  > 4 14 cm-Ankle 14    Left Sural Sensory  Calf-Lat mall 3.7  < 4.6 7  > 4 Calf-Lat mall 14    Right Sural Sensory  Calf-Lat mall 3.7  < 4.6 6  > 4 Calf-Lat mall 14     H-Reflex Results    M-Lat H Lat H Neg Amp H-M Lat  Site (ms) (ms) Norm (mV) (ms)  Left Tibial H-Reflex  Pop fossa 7.0 31.2  < 35.0 3.0 24.2  Right Tibial H-Reflex  Pop fossa 6.4 31.3  < 35.0 1.60 24.9   Electromyography   Side Muscle Ins.Act Fibs Fasc Recrt Amp Dur Poly Activation Comment  Left Tib ant Nml Nml Nml Nml Nml Nml Nml Nml N/A  Left  Gastroc MH Nml Nml Nml Nml Nml Nml Nml Nml N/A  Left Rectus fem Nml Nml Nml Nml Nml Nml Nml Nml N/A  Left Biceps fem SH Nml Nml Nml Nml Nml Nml Nml Nml N/A  Left Gluteus med Nml Nml Nml Nml Nml Nml Nml Nml N/A  Right Tib ant Nml Nml Nml Nml Nml Nml Nml Nml N/A  Right Gastroc MH Nml Nml Nml Nml Nml Nml Nml Nml N/A  Right Rectus fem Nml Nml Nml Nml Nml Nml Nml Nml N/A  Right Biceps fem SH Nml Nml Nml Nml Nml Nml Nml Nml N/A  Right Gluteus med Nml Nml Nml Nml Nml Nml Nml Nml N/A      Waveforms:  Motor           Sensory             H-Reflex

## 2024-01-15 NOTE — Telephone Encounter (Signed)
Discussed the results of patient's EMG after the procedure today. EMG was normal with no evidence of a large fiber neuropathy or radiculopathy.  While small fiber neuropathy is possible given paresthesias in feet, her episodic right leg weakness would not be well explained by a small fiber neuropathy. A central etiology is still possible, thus, I will repeat MRI brain 6 months from previous. The differential for stereotyped neurologic deficits includes fixed stenosis causing ischemia (very unlikely in the case), migraine, or seizure. She does have a migraine history, but her headaches are well controlled currently on topamax. Interestingly, the patient states that taking topamax seems to help her right sided weakness soon after taking the medication each mid day as her symptoms are worse in the morning.  The etiology of her symptoms is still unclear. She will keep a journal of symptoms looking for triggers or other associated symptoms. She will follow up with me as planned on 05/01/24 and contact me with any changes.  All questions were answered.  Melinda Balint, MD [facility]

## 2024-01-18 ENCOUNTER — Encounter: Payer: Self-pay | Admitting: Family Medicine

## 2024-01-23 ENCOUNTER — Encounter: Payer: Self-pay | Admitting: Family Medicine

## 2024-01-23 ENCOUNTER — Telehealth (INDEPENDENT_AMBULATORY_CARE_PROVIDER_SITE_OTHER): Payer: No Typology Code available for payment source | Admitting: Family Medicine

## 2024-01-23 DIAGNOSIS — R202 Paresthesia of skin: Secondary | ICD-10-CM | POA: Diagnosis not present

## 2024-01-23 DIAGNOSIS — R200 Anesthesia of skin: Secondary | ICD-10-CM | POA: Diagnosis not present

## 2024-01-23 NOTE — Progress Notes (Signed)
Virtual Visit via Video Note  I connected with Melinda Parsons on 01/23/24 at  4:00 PM EST by a video enabled telemedicine application and verified that I am speaking with the correct person using two identifiers.   I discussed the limitations of evaluation and management by telemedicine and the availability of in person appointments. The patient expressed understanding and agreed to proceed.  Patient location: at home Provider location: in office  Subjective:    CC:  No chief complaint on file.   HPI:  She was seen in the emergency department on December 28 for palpitations and paresthesias.  Her symptoms started around the week of Thanksgiving she would wake up feeling like her right foot was numb.  But it was not a tingling sensation like it fell asleep.  It would feel weak and even floppy in 1 morning when she got out of bed it felt like she was going to fall.  She says that the symptoms have gradually progressed and now she is getting some weakness and numbness in her right arm and right hand and as well as her left foot. She had a CT of the head with no acute abnormalities.  Cardiac workup was negative they then transferred her to [facility] to get a brain MRI.  MRI was negative and so they recommended further workup with neurology.  She was seen by neurology on January 8.  She underwent nerve conduction of her lower extremities..  Study was normal.  Symptoms:  waking up with weak right side daily  Right foot unable to feel it Now intermittent unable to feel left foot, shin and now right hand and part of arm Patiently gets numbness in her face as well and sometimes feels like it is difficult to focus her vision.  No recent headaches.  MRI of the brain with and without contrast in the emergency room on December 29 showed No hemosiderin deposition to suggest remote hemorrhage. Normal cerebral volume for age. Scattered T2 hyperintense signal in the periventricular white matter, which  have increased in number compared to 2016, likely the sequela of mild-to-moderate chronic small vessel ischemic disease. No evidence of remote cortical or lacunar infarct.   ET the head showed scattered hypodensities in the periventricular white matter slightly more pronounced than prior imaging.  No acute infarct or hemorrhage.  Past medical history, Surgical history, Family history not pertinant except as noted below, Social history, Allergies, and medications have been entered into the medical record, reviewed, and corrections made.    Objective:    General: Speaking clearly in complete sentences without any shortness of breath.  Alert and oriented x3.  Normal judgment. No apparent acute distress.    Impression and Recommendations:    Problem List Items Addressed This Visit   None Visit Diagnoses       Right arm numbness    -  Primary   Relevant Orders   MR Cervical Spine Wo Contrast     Numbness and tingling in right hand       Relevant Orders   MR Cervical Spine Wo Contrast     Numbness of right foot           Orders Placed This Encounter  Procedures   MR Cervical Spine Wo Contrast    Standing Status:   Future    Expiration Date:   01/22/2025    What is the patient's sedation requirement?:   No Sedation    Does the patient have  a pacemaker or implanted devices?:   No    Preferred imaging location?:   [facility] (table limit-350lbs)    No orders of the defined types were placed in this encounter.  Right arm numbness-interestingly she did have a plain film cervical spine in April 2024 which did show some moderate right neural foraminal stenosis at C5-C6 and some mild to space narrowing and degenerative changes at C5-C6.  She actually also had an MRI of the cervical spine back in August 2021 showing some flattening of the ventral thecal sac resulting in mild spinal stenosis and some mild flattening of the ventral spinal cord moderate to severe right moderate  left sided C6 foraminal narrowing.  I discussed the assessment and treatment plan with the patient. The patient was provided an opportunity to ask questions and all were answered. The patient agreed with the plan and demonstrated an understanding of the instructions.  Pleated work accommodation form today we will send to her work and will put a copy in the chart for review.   The patient was advised to call back or seek an in-person evaluation if the symptoms worsen or if the condition fails to improve as anticipated.  I spent 35 minutes on the day of the encounter to include pre-visit record review, face-to-face time with the patient and post visit ordering of test.   Nani Gasser, MD

## 2024-01-23 NOTE — Progress Notes (Signed)
Called and lvm asking pt to log on for her appointment

## 2024-01-28 ENCOUNTER — Ambulatory Visit: Payer: No Typology Code available for payment source

## 2024-01-28 DIAGNOSIS — R202 Paresthesia of skin: Secondary | ICD-10-CM | POA: Diagnosis not present

## 2024-01-28 DIAGNOSIS — R200 Anesthesia of skin: Secondary | ICD-10-CM | POA: Diagnosis not present

## 2024-02-07 ENCOUNTER — Telehealth: Payer: Self-pay

## 2024-02-07 NOTE — Telephone Encounter (Signed)
 Yes, since it has been 10 days please do so

## 2024-02-07 NOTE — Telephone Encounter (Signed)
 Copied from CRM [id number]. Topic: Clinical - Lab/Test Results >> Feb 05, 2024  8:44 AM Nila Nephew wrote: Reason for CRM: Patient calling to request imaging results be expedited if possible.

## 2024-02-07 NOTE — Telephone Encounter (Signed)
 Would you like me to have patient imaging moved  up to STAT read?

## 2024-02-08 NOTE — Telephone Encounter (Signed)
 Called radiology reading room and changed to STAT read=

## 2024-02-09 ENCOUNTER — Encounter: Payer: Self-pay | Admitting: Family Medicine

## 2024-02-09 ENCOUNTER — Ambulatory Visit: Payer: No Typology Code available for payment source | Admitting: Family Medicine

## 2024-02-09 DIAGNOSIS — R200 Anesthesia of skin: Secondary | ICD-10-CM

## 2024-02-09 DIAGNOSIS — R202 Paresthesia of skin: Secondary | ICD-10-CM

## 2024-02-09 NOTE — Progress Notes (Signed)
 Hi Melinda Parsons, MRI shows severe narrowing on the right side where the C5 VI nerve root comes out.  This would definitely explain the numbness and tingling that you are getting.  Honestly it may be time to do a consult with a neurosurgeon to see if they would recommend any intervention at this point.  Especially since you are starting to develop some weakness in that right arm.  I do not want you to have any permanent nerve damage.  If you are okay with this then please let me know and I will place a referral.

## 2024-02-12 NOTE — Telephone Encounter (Signed)
 Neurology referral pended for review.

## 2024-02-14 ENCOUNTER — Ambulatory Visit: Payer: No Typology Code available for payment source | Admitting: Family Medicine

## 2024-02-19 ENCOUNTER — Ambulatory Visit: Payer: Self-pay | Admitting: Family Medicine

## 2024-02-19 NOTE — Telephone Encounter (Signed)
  Chief Complaint: left axillary pain, right breast with shooting pain  Symptoms: Saturday night woke up with shooting pain to right breast area. Left armpit pain "felt like heartbeat" in armpit. Pain gone now from left armpit area and no shooting pain to right breast. Hx neurological issues.  Frequency: Saturday  Pertinent Negatives: Patient denies pain now. No chest pain no difficulty breathing no fever no rash  Disposition: [] ED /[] Urgent Care (no appt availability in office) / [x] Appointment(In office/virtual)/ []  [facility]/ [] Home Care/ [] Refused Recommended Disposition /[] [facility]/ []  Follow-up with PCP Additional Notes:   Scheduled my chart VV for tomorrow. None available with PCP. Please advise due to patient hx if she needs to be seen sooner or in person. Recommended if pain returns call back.       Copied from CRM [id number]. Topic: Clinical - Red Word Triage >> Feb 19, 2024 10:26 AM Nila Nephew wrote: Red Word that prompted transfer to Nurse Triage: Palpatations all night on Saturday, palpitations and pain from under arm radiating down her arm. Patient seeking to speak to nurse. Reason for Disposition  [1] MODERATE pain (e.g., interferes with normal activities) AND [2] present > 3 days  Answer Assessment - Initial Assessment Questions 1. ONSET: "When did the pain start?"     2 days ago  2. LOCATION: "Where is the pain located?"     Left armpit area  3. PAIN: "How bad is the pain?" (Scale 1-10; or mild, moderate, severe)   - MILD (1-3): Doesn't interfere with normal activities.   - MODERATE (4-7): Interferes with normal activities (e.g., work or school) or awakens from sleep.   - SEVERE (8-10): Excruciating pain, unable to do any normal activities, unable to hold a cup of water.     No pain now but Saturday night and waking up from pain  4. WORK OR EXERCISE: "Has there been any recent work or exercise that involved this part of the body?"     na 5.  CAUSE: "What do you think is causing the arm pain?"     Not sure  6. OTHER SYMPTOMS: "Do you have any other symptoms?" (e.g., neck pain, swelling, rash, fever, numbness, weakness)     Right arm pit area pain over weekend , right breast area shooting pains and now gone  7. PREGNANCY: "Is there any chance you are pregnant?" "When was your last menstrual period?"     na  Protocols used: Arm Pain-A-AH

## 2024-02-20 ENCOUNTER — Telehealth (INDEPENDENT_AMBULATORY_CARE_PROVIDER_SITE_OTHER): Admitting: Medical-Surgical

## 2024-02-20 DIAGNOSIS — M79602 Pain in left arm: Secondary | ICD-10-CM

## 2024-02-20 DIAGNOSIS — N644 Mastodynia: Secondary | ICD-10-CM

## 2024-02-24 NOTE — Progress Notes (Unsigned)
 Virtual Visit via Video Note  I connected with Melinda Parsons on 02/25/24 at  4:00 PM EDT by a video enabled telemedicine application and verified that I am speaking with the correct person using two identifiers.   I discussed the limitations of evaluation and management by telemedicine and the availability of in person appointments. The patient expressed understanding and agreed to proceed.  Patient location: home Provider locations: office  Subjective:    CC: Left axillary pain, right breast pain  HPI: Pleasant 53 year old female presenting via MyChart video visit with reports of experiencing some pain in the left axilla that extends down the posterior side of the arm.  This has been going on for several days but seems to be on and off rather than constant.  At 1 point, the pain stopped on its own and was shortly followed by right shooting pain into the breast.  Once this stopped, a while later, the left axillary and arm pain came back. No recent injury or trauma to the area. No change in activity. Has not tried anything for the pain so far.    Past medical history, Surgical history, Family history not pertinant except as noted below, Social history, Allergies, and medications have been entered into the medical record, reviewed, and corrections made.   Review of Systems: See HPI for pertinent positives and negatives.   Objective:    General: Speaking clearly in complete sentences without any shortness of breath.  Alert and oriented x3.  Normal judgment. No apparent acute distress.  Impression and Recommendations:    1. Left arm pain (Primary) Discussed the likelihood that her arm/axilla pain is related to her problems in the cervical spine. On review, her MRI showed significant issues with bilateral facet degeneration, foraminal narrowing, and disc bulging that would explain her symptoms. Discussed using anti-inflammatories and working on physical therapy exercises, stretching, yoga,  massage etc. Patient reports considering a chiropractor and I feel that it's worth a try but to make sure they know about the issues in her neck before they begin to plan making adjustments.   2. Breast pain, right Unclear etiology. No recent changes in activity or caffeine intake. Has been followed by OB/GYN for women's health care and is UTD on mammograms. Given that the pain only happened once and she has not noted any changes in the breast, would recommend monitoring at home. She is also welcome to reach out to her OB/GYN to see if they feel that [place] or diagnostic mammogram would be indicated.   I discussed the assessment and treatment plan with the patient. The patient was provided an opportunity to ask questions and all were answered. The patient agreed with the plan and demonstrated an understanding of the instructions.   The patient was advised to call back or seek an in-person evaluation if the symptoms worsen or if the condition fails to improve as anticipated.  Return if symptoms worsen or fail to improve.  Thayer Ohm, DNP, APRN, FNP-BC [facility]

## 2024-02-25 ENCOUNTER — Encounter: Payer: Self-pay | Admitting: Medical-Surgical

## 2024-03-04 ENCOUNTER — Ambulatory Visit: Payer: No Typology Code available for payment source | Admitting: Family Medicine

## 2024-03-04 ENCOUNTER — Encounter: Payer: Self-pay | Admitting: Family Medicine

## 2024-03-04 ENCOUNTER — Telehealth (INDEPENDENT_AMBULATORY_CARE_PROVIDER_SITE_OTHER): Admitting: Family Medicine

## 2024-03-04 DIAGNOSIS — Z79890 Hormone replacement therapy (postmenopausal): Secondary | ICD-10-CM | POA: Diagnosis not present

## 2024-03-04 DIAGNOSIS — F32A Depression, unspecified: Secondary | ICD-10-CM | POA: Diagnosis not present

## 2024-03-04 DIAGNOSIS — F419 Anxiety disorder, unspecified: Secondary | ICD-10-CM | POA: Diagnosis not present

## 2024-03-04 DIAGNOSIS — M5412 Radiculopathy, cervical region: Secondary | ICD-10-CM | POA: Insufficient documentation

## 2024-03-04 NOTE — Progress Notes (Signed)
    Virtual Visit via Video Note  I connected with Melinda Parsons on 03/04/24 at  1:20 PM EDT by a video enabled telemedicine application and verified that I am speaking with the correct person using two identifiers.   I discussed the limitations of evaluation and management by telemedicine and the availability of in person appointments. The patient expressed understanding and agreed to proceed.  Patient location: at home Provider location: in office  Subjective:    CC:   Chief Complaint  Patient presents with   mood    HPI: Has appointment with neurosurgery this afternoon Dr. Coletta Memos.  She has severe narrowing on the right side where the C5 nerve root comes out based on her MRI that she had done in February.  She is now starting to have some intermittent pain on her left side as well.  In regards to mood she feels like the buspirone is working well and it is helpful she does take it 3 times a day she does not feel like we need to make any changes or adjustment.  Did a video visit recently for some pain going into her left axilla it almost was like a throbbing type pain and then after that seem to resolve she actually had breast pain that lasted for about a day.  She gets mammograms every 6 months.  Also recently started on hormone replacement therapy she is on a estrogen patch and taking 200 mg of progesterone at bedtime and says it has made a noticeable improvement in her sleep quality she is very happy with the changes.  Past medical history, Surgical history, Family history not pertinant except as noted below, Social history, Allergies, and medications have been entered into the medical record, reviewed, and corrections made.    Objective:    General: Speaking clearly in complete sentences without any shortness of breath.  Alert and oriented x3.  Normal judgment. No apparent acute distress.    Impression and Recommendations:    Problem List Items Addressed This Visit        Nervous and Auditory   Cervical radiculopathy   Appointment coming up with neurosurgeon.  Refill gabapentin.        Other   Hormone replacement therapy (HRT)   Currently on HRT with her OB/GYN.      Anxiety and depression - Primary   She is happy with her current dosing regimen with the buspirone.  No changes made today.  Follow-up in 6-9 months       No orders of the defined types were placed in this encounter.   No orders of the defined types were placed in this encounter.    I discussed the assessment and treatment plan with the patient. The patient was provided an opportunity to ask questions and all were answered. The patient agreed with the plan and demonstrated an understanding of the instructions.   The patient was advised to call back or seek an in-person evaluation if the symptoms worsen or if the condition fails to improve as anticipated.   Nani Gasser, MD

## 2024-03-04 NOTE — Assessment & Plan Note (Signed)
 Currently on HRT with her OB/GYN.

## 2024-03-04 NOTE — Assessment & Plan Note (Signed)
 She is happy with her current dosing regimen with the buspirone.  No changes made today.  Follow-up in 6-9 months

## 2024-03-04 NOTE — Assessment & Plan Note (Signed)
 Appointment coming up with neurosurgeon.  Refill gabapentin.

## 2024-03-04 NOTE — Progress Notes (Signed)
 LVM advising pt that I was calling to do her prescreening prior to her appointment.

## 2024-03-05 ENCOUNTER — Other Ambulatory Visit (HOSPITAL_COMMUNITY): Payer: Self-pay

## 2024-03-25 ENCOUNTER — Encounter: Payer: Self-pay | Admitting: Family Medicine

## 2024-03-25 DIAGNOSIS — R200 Anesthesia of skin: Secondary | ICD-10-CM

## 2024-03-25 DIAGNOSIS — R202 Paresthesia of skin: Secondary | ICD-10-CM

## 2024-03-25 DIAGNOSIS — M5412 Radiculopathy, cervical region: Secondary | ICD-10-CM

## 2024-04-04 ENCOUNTER — Telehealth: Payer: Self-pay

## 2024-04-04 NOTE — Telephone Encounter (Signed)
 Sent a letter/fax to [facility] for most recent PAP.

## 2024-04-13 ENCOUNTER — Other Ambulatory Visit: Payer: Self-pay | Admitting: Family Medicine

## 2024-04-13 DIAGNOSIS — G43109 Migraine with aura, not intractable, without status migrainosus: Secondary | ICD-10-CM

## 2024-04-17 ENCOUNTER — Other Ambulatory Visit: Payer: Self-pay | Admitting: Family Medicine

## 2024-04-17 DIAGNOSIS — F419 Anxiety disorder, unspecified: Secondary | ICD-10-CM

## 2024-04-18 NOTE — Progress Notes (Signed)
 NEUROLOGY FOLLOW UP OFFICE NOTE  Melinda Parsons [id number]  Subjective:  Melinda Parsons is a 53 y.o. year old left handed female with a history of HTN, HLD, migraine, GERD who we last saw on 12/20/23 for intermittent numbness and tingling in arms and legs.  To briefly review: 12/20/23: Patient has had right leg numbness that intermittent for a few months. She has consistent tingling in that foot. She states her leg feels like jelly and weak. When she wakes up, she feels like the right leg is dead. She has to rub it and get it warm to get it feeling more normal. She started having similar, but less severe, symptoms in the left foot over the past week.    Patient went to ED on 12/09/23 for right foot numbness and palpitations that were progressive throughout the day that progressed to right arm. Symptoms improved then were on the left. CT head and MRI brain showed no acute process.   She mentions a history of neck pain and also paresthesias in the right arm. This has gotten better with only rare symptoms of tingling in RUE. Of note, patient was seen by Dr. Gracie Lav in 2016 for migraines and paresthesias. She had an EMG of the RUE in 2016 that was normal.   She also has a history of migraines. This was associated with vision loss in the past. Currently, her face (bilaterally) and mouth will go numb. She denies head pain with these. It lasts about 15 minutes and stops. She takes topiramate  100 mg XR daily (has been for years). This has helped her symptoms. She currently has about 1 "silent migraine" per week. She does not have a rescue medication. She will get lines in her vision at times with no other symptoms or with face/mouth numbness.    She does have headaches when questioned further. The pain is above her eyes. It is a constant pain that she has difficulty describing. She endorses photophobia, phonophobia, and nausea. She wants to go in a cold dark room when they occur. They can last 1 to 3 days. She  does not think the paresthesias occur with the headaches. She tends to get 1 of these per month around her menstrual cycle. She is now going through menopause.   She has significant stress currently. She is taking care of her mother with cancer, career change, and daughter who has severe depression. She denies any significant depression. She endorses anxiety for which she takes buspar , which helps her manage.   Regarding her sleep, she sleeps about 5 hours per night. She states her dog wakes her up and then she cannot return to sleep. She does not feel well rested and is tired through the day. She can nap during the day if able. She does not snore.    She does not report any constitutional symptoms like fever, night sweats, anorexia or unintentional weight loss.   She takes gabapentin  600 mg at bedtime. She has been on it for years and helps with tingling.   She is a never smoker. EtOH use: 1 glass of wine per night, has stopped since 2025 Restrictive diet? No Family history of neuropathy/myopathy/neurologic disease? Father with neuropathy   She takes B1 and B12, for about 1 year.  Most recent Assessment and Plan (12/20/23): Melinda Parsons is a 53 y.o. female who presents for evaluation of numbness and tingling in right greater than left foot. She has a relevant medical history of HTN, HLD, migraine, GERD. Her  neurological examination is pertinent for mildly diminished reflexes. Available diagnostic data is significant for MRI cervical spine in 2021 that showed moderate to severe right C6 neural foraminal stenosis. This could be the cause of her prior right arm symptoms. Her MRI brain from 12/09/23 shows white matter abnormalities as can be seen in chronic microvascular ischemia or migraines, but also demyelinating disease. Given the history of vision loss, this is a consideration, though there are not clear signs on exam today of a central process. She also has a history of migraines with odd  symptoms such as numbness of face and mouth, so this is another possible cause though not clearly the case given that the symptoms do not correlate to headaches or other symptoms. I will start by getting labs to look for treatable causes and an EMG to look for a peripheral process such as lumbosacral radiculopathy. I may expand the work up if needed.   PLAN: -Blood work: B1, IFE, CMP -EMG (R > L LE) -Continue Topiramate  100 mg. May consider changing if no other etiology is found -Discussed the importance of sleep and mood to cognition -Will likely repeat MRI brain w/wo contrast in about 6 months  Since their last visit: Labs were unremarkable. EMG of bilateral lower limbs was normal. Per my phone note from 01/15/24: Discussed the results of patient's EMG after the procedure today. EMG was normal with no evidence of a large fiber neuropathy or radiculopathy.   While small fiber neuropathy is possible given paresthesias in feet, her episodic right leg weakness would not be well explained by a small fiber neuropathy. A central etiology is still possible, thus, I will repeat MRI brain 6 months from previous. The differential for stereotyped neurologic deficits includes fixed stenosis causing ischemia (very unlikely in the case), migraine, or seizure. She does have a migraine history, but her headaches are well controlled currently on topamax . Interestingly, the patient states that taking topamax  seems to help her right sided weakness soon after taking the medication each mid day as her symptoms are worse in the morning.   The etiology of her symptoms is still unclear. She will keep a journal of symptoms looking for triggers or other associated symptoms. She will follow up with me as planned on 05/01/24 and contact me with any changes.  Patient still has burning and tingling in feet and hands. Gabapentin  helps with the hands but not the legs. She still has episodic right leg weakness. She can walk on it but  it feels weird.  Patient is still on topamax  and gets a headache once every couple of weeks. These are stable to improved. She will have coldness of the face about once a week.  She states that when she sleeps better, she does not have as many symptoms. She will not sleep well because of stress with family.    MEDICATIONS:  Outpatient Encounter Medications as of 05/01/2024  Medication Sig   azelastine  (ASTELIN ) 0.1 % nasal spray Place 2 sprays into both nostrils 2 (two) times daily. Use in each nostril as directed (Patient taking differently: Place 2 sprays into both nostrils as needed. Use in each nostril as directed)   busPIRone  (BUSPAR ) 15 MG tablet TAKE 1 TABLET BY MOUTH 3 TIMES DAILY. OK TO TAKE EXTRA TAB ONCE A DAY IF NEEDED.   cyanocobalamin  (VITAMIN B12) 1000 MCG tablet Take 1 tablet (1,000 mcg total) by mouth daily.   estradiol (VIVELLE-DOT) 0.05 MG/24HR patch Place 1 patch onto the skin 2 (  two) times a week.   gabapentin  (NEURONTIN ) 600 MG tablet TAKE 1 TABLET BY MOUTH AT BEDTIME.   hyoscyamine (LEVBID) 0.375 MG 12 hr tablet Take 0.375 mg by mouth 2 (two) times daily. She is taking 1 tablet daily   progesterone  (PROMETRIUM ) 200 MG capsule Take 200 mg by mouth daily.   thiamine  (VITAMIN B1) 100 MG tablet TAKE 1 TABLET BY MOUTH EVERY DAY   Topiramate  ER (TROKENDI  XR) 100 MG CP24 TAKE 1 CAPSULE BY MOUTH EVERY DAY   tretinoin (RETIN-A) 0.025 % cream SMARTSIG:Sparingly Topical Every Night   No facility-administered encounter medications on file as of 05/01/2024.    PAST MEDICAL HISTORY: Past Medical History:  Diagnosis Date   Anxiety    GERD (gastroesophageal reflux disease)    Hypertension    IBS (irritable bowel syndrome)    Lumbar herniated disc    Migraine    Neuropathy    Pre-eclampsia    with daughter   PVC (premature ventricular contraction)    Previous holter in Dec 2011 showed PVCs and 2 3 beat runs of SVT   Seasonal allergies     PAST SURGICAL HISTORY: Past  Surgical History:  Procedure Laterality Date   NO PAST SURGERIES      ALLERGIES: No Known Allergies  FAMILY HISTORY: Family History  Problem Relation Age of Onset   Hypertension Mother    Hyperlipidemia Mother    Breast cancer Mother 33   Diverticulitis Father    Hypertension Other        grandmother   Alcohol abuse Other        grandparent    SOCIAL HISTORY: Social History   Tobacco Use   Smoking status: Never   Smokeless tobacco: Never  Vaping Use   Vaping status: Never Used  Substance Use Topics   Alcohol use: Not Currently    Alcohol/week: 8.0 - 9.0 standard drinks of alcohol    Types: 7 Glasses of wine, 1 - 2 Standard drinks or equivalent per week   Drug use: No   Social History   Social History Narrative   Lives at home with her husband and daughter.   Left-handed.   4-6 cups caffeine per day.   Leadership development      Objective:  Vital Signs:  BP 118/86   Pulse 97   Ht 5\' 3"  (1.6 m)   Wt 116 lb (52.6 kg)   LMP 03/06/2023   SpO2 100%   BMI 20.55 kg/m    General: No acute distress.  Patient appears well-groomed.   Head:  Normocephalic/atraumatic Neck: supple Lungs: Non-labored breathing on room air  Neurological Exam: Mental status: alert and oriented, speech fluent and not dysarthric, language intact.  Cranial nerves: CN I: not tested CN II: pupils equal, round and reactive to light, visual fields intact CN III, IV, VI:  full range of motion, no nystagmus, no ptosis CN V: facial sensation intact. CN VII: upper and lower face symmetric CN VIII: hearing intact CN IX, X: uvula midline CN XI: sternocleidomastoid and trapezius muscles intact CN XII: tongue midline  Bulk & Tone: normal, no fasciculations. Motor:  muscle strength 5/5 throughout Deep Tendon Reflexes:  2+ throughout.   Sensation:  Pinprick, vibratory sensation intact. Finger to nose testing:  Without dysmetria.   Gait:  Normal station and stride.  Romberg  negative.   Labs and Imaging review: New results: 12/20/23: B1 elevated (161) IFE: no M protein CMP significant for mildly elevated ALT (40)  EMG (  01/15/24): NCV & EMG Findings: Extensive electrodiagnostic evaluation of bilateral lower limbs shows: Bilateral sural, bilateral superficial peroneal/fibular, and right medial plantar sensory responses are within normal limits. Bilateral peroneal/fibular (EDB) and tibial (AH) motor responses are within normal limits. Bilateral H reflex latency is within normal limits. There is no evidence of active or chronic motor axon loss changes affecting any of the tested muscles. Motor unit configuration and recruitment pattern is within normal limits.   Impression: This is a normal study. In particular, there is no electrodiagnostic evidence of a right or left lumbosacral (L3-S1) radiculopathy, large fiber sensorimotor neuropathy, or myopathy.  MRI cervical spine wo contrast (01/28/24): IMPRESSION: 1. No significant interval change in the appearance of the cervical spine since the prior exam dated 07/12/2020. 2. Severe right and moderate to severe left neural foraminal narrowing at C5-C6. 3. Moderate right neural foraminal narrowing at C6-C7.  MRI brain w/o contrast (external - 03/28/24): No acute findings. Similar white matter disease as 11/2023 most consistent with chronic small vessel disease as opposed to demyelination.  MRI thoracic spine w/o contrast (external - 03/28/24): Normal thoracic spinal cord.  Previously reviewed results: 12/09/23: CMP unremarkable CBC w/ diff unremarkable   09/30/22: MMA: 80 (normal 87-318) B12: 638   08/05/22: TSH wnl Lipid panel: tChol 165, LDL 74, TG 99 Zinc  wnl Copper  wnl ANCA negative B1 low at 7 HbA1c: 4.6   Imaging: MRI brain w/wo contrast (12/09/23): FINDINGS: Brain: No restricted diffusion to suggest acute or subacute infarct. No abnormal parenchymal or meningeal enhancement.   No acute  hemorrhage, mass, mass effect, or midline shift. No hydrocephalus or extra-axial collection. Pituitary and craniocervical junction within normal limits.   No hemosiderin deposition to suggest remote hemorrhage. Normal cerebral volume for age. Scattered T2 hyperintense signal in the periventricular white matter, which have increased in number compared to 2016, likely the sequela of mild-to-moderate chronic small vessel ischemic disease. No evidence of remote cortical or lacunar infarct.   Vascular: Normal arterial flow voids. Normal arterial and venous enhancement.   Skull and upper cervical spine: Normal marrow signal.   Sinuses/Orbits: Right maxillary mucous retention cyst. Mucosal thickening in the ethmoid air cells. No acute finding in the orbits.   Other: The mastoid air cells are well aerated.   IMPRESSION: No acute intracranial process. No evidence of acute or subacute infarct.   Thoracic spine xray (04/20/23): FINDINGS: There is no evidence of thoracic spine fracture. Alignment is normal. No other significant bone abnormalities are identified.   IMPRESSION: Negative.   Lumbar spine xray (04/11/23): FINDINGS: There are 5 non-rib-bearing lumbar-type vertebral bodies. Vertebral body heights are preserved, without evidence of acute injury. Alignment is normal. There is no evidence of spondylolysis.   The disc heights are overall preserved. There is no significant degenerative change.   IMPRESSION: No evidence of acute injury in the lumbar spine.   Cervical spine xray (04/11/23): IMPRESSION: 1. No definite evidence of acute injury in the cervical spine. 2. Degenerative changes at C5-C6 with osseous right neural foraminal stenosis.   MRI cervical spine wo contrast (07/12/20): IMPRESSION: 1. Degenerative disc osteophyte at C5-6 with resultant mild canal with moderate to severe right worse than left C6 foraminal stenosis. 2. Small right paracentral disc protrusion at  C6-7 with resultant mild spinal stenosis but no cord impingement.   EMG (09/29/2015 - Dr. Gracie Lav at [facility]): FINDINGS: NERVE CONDUCTION STUDY: Right peroneal sensory response was normal. Bilateral median, ulnar sensory and motor responses were normal. Right peroneal to EDB,  right tibial motor responses were normal. Right tibial H reflexe was present.   NEEDLE ELECTROMYOGRAPHY: Selected needle examination was performed at right upper extremity, and right cervical paraspinal muscles.   Needle examination of right extensor digitorum communis, biceps, triceps, deltoid, pronator teres, first dorsal interossei was normal.   There was no spontaneous activity at right cervical paraspinal muscles, right C5, 6, 7.   IMPRESSION:  This is a normal study. There was no electrodiagnostic evidence of right upper extremity neuropathy, or right cervical radiculopathy.  Assessment/Plan:  This is Melinda Parsons, a 53 y.o. female with numbness in legs, burning and tingling in legs and hands, and migraines. Her migraines appear well controlled with Topamax . Her MRI cervical spine showed severe right and moderate to severe left C5-6 neural foraminal stenosis, but she does not have significant symptoms in her arms. Any symptoms in her arms are well controlled with gabapentin . Her primary symptoms are in the legs, for which gabapentin  does not help. EMG of bilateral lower limbs was normal. MRI brain in 11/2023 had T2 hyperintensities that may be related to chronic microvascular ischemia, but I wanted to repeat to ensure there were no changes to suggest demyelinating disease such as MS. Repeat MRI brain in 03/2024 was similar to prior. MRI cervical and thoracic spine showed normal cord. Currently, I am not well able to explain symptoms. A small fiber neuropathy is possible, so I will evaluate for this. It is possible symptoms are not a structural neurologic issue, which patient understands.   Of note, her B1 was previously low  for which she is now on supplementation.  Plan: -Skin biopsy to evaluate for small fiber neuropathy -Continue gabapentin  600 mg at bedtime -Continue topamax  100 mg daily for migraines -Continue B1 100 mg daily  Return to clinic to testing is complete  Total time spent reviewing records, interview, history/exam, documentation, and coordination of care on day of encounter:  45 min  Melinda Coats, MD

## 2024-04-30 ENCOUNTER — Encounter: Payer: Self-pay | Admitting: Family Medicine

## 2024-04-30 DIAGNOSIS — F4329 Adjustment disorder with other symptoms: Secondary | ICD-10-CM

## 2024-05-01 ENCOUNTER — Encounter: Payer: Self-pay | Admitting: Neurology

## 2024-05-01 ENCOUNTER — Ambulatory Visit: Payer: No Typology Code available for payment source | Admitting: Neurology

## 2024-05-01 VITALS — BP 118/86 | HR 97 | Ht 63.0 in | Wt 116.0 lb

## 2024-05-01 DIAGNOSIS — E519 Thiamine deficiency, unspecified: Secondary | ICD-10-CM

## 2024-05-01 DIAGNOSIS — R209 Unspecified disturbances of skin sensation: Secondary | ICD-10-CM

## 2024-05-01 DIAGNOSIS — G43109 Migraine with aura, not intractable, without status migrainosus: Secondary | ICD-10-CM

## 2024-05-01 DIAGNOSIS — M542 Cervicalgia: Secondary | ICD-10-CM

## 2024-05-01 DIAGNOSIS — R202 Paresthesia of skin: Secondary | ICD-10-CM

## 2024-05-01 DIAGNOSIS — M5412 Radiculopathy, cervical region: Secondary | ICD-10-CM | POA: Diagnosis not present

## 2024-05-01 DIAGNOSIS — R200 Anesthesia of skin: Secondary | ICD-10-CM

## 2024-05-01 DIAGNOSIS — Z72820 Sleep deprivation: Secondary | ICD-10-CM

## 2024-05-01 NOTE — Patient Instructions (Addendum)
 I am not sure the cause of your symptoms. Your EMG and brain and spinal cord images do not explain your symptoms well.  I am ordering a skin biopsy to evaluate for small fiber nerve problems that are not seen on EMG.  In the meantime, continue gabapentin  600 mg at bedtime.  We will discuss next steps when I have your biopsy results.  Continue Topamax  for migraines.  Continue B1 100 mg daily.  The physicians and staff at [facility] are committed to providing excellent care. You may receive a survey requesting feedback about your experience at our office. We strive to receive "very good" responses to the survey questions. If you feel that your experience would prevent you from giving the office a "very good " response, please contact our office to try to remedy the situation. We may be reached at [phone]. Thank you for taking the time out of your busy day to complete the survey.  Rommie Coats, MD [facility]

## 2024-05-02 ENCOUNTER — Telehealth: Payer: Self-pay | Admitting: Neurology

## 2024-05-02 ENCOUNTER — Telehealth: Payer: Self-pay

## 2024-05-02 NOTE — Telephone Encounter (Signed)
 Pt Ref. Call # for skin biopsy is [phone]

## 2024-05-02 NOTE — Telephone Encounter (Signed)
 Called and LM for pt to call us  back to sch a biopsy with Dr.Hill

## 2024-05-21 ENCOUNTER — Ambulatory Visit (INDEPENDENT_AMBULATORY_CARE_PROVIDER_SITE_OTHER): Admitting: Neurology

## 2024-05-21 DIAGNOSIS — R202 Paresthesia of skin: Secondary | ICD-10-CM

## 2024-05-21 NOTE — Progress Notes (Signed)
Punch Biopsy Procedure Note  Preprocedure Diagnosis: paresthesia of skin   Postprocedure Diagnosis: same  Locations: Site 1: right lateral distal leg;  Site 2: right lateral thigh;   Indications: r/o small fiber neuropathy  Anesthesia: 5 mL Lidocaine 1% with epinephrine  Procedure Details Patient informed of the risks (including but not limited to bleeding, pain, infection, scar and infection) and benefits of the procedure.  Informed consent obtained.  The areas which were chosen for biopsy, as above, and surrounding areas were given a sterile prep using alcohol and iodine. The skin was then stretched perpendicular to the skin tension lines and sample removed using the 3 mm punch. Pressure applied, hemostasis achieved.   Dressing applied. The specimen(s) was sent for pathologic examination. The patient tolerated the procedure well.  Estimated Blood Loss: 1 ml  Condition: Stable  Complications: none.  Plan: 1. Instructed to keep the wound dry and covered for 24h and clean thereafter. 2. Warning signs of infection were reviewed.    Jacquelyne Balint, MD [facility]

## 2024-05-24 ENCOUNTER — Ambulatory Visit (INDEPENDENT_AMBULATORY_CARE_PROVIDER_SITE_OTHER): Admitting: Licensed Clinical Social Worker

## 2024-05-24 DIAGNOSIS — F32A Depression, unspecified: Secondary | ICD-10-CM | POA: Diagnosis not present

## 2024-05-24 DIAGNOSIS — F419 Anxiety disorder, unspecified: Secondary | ICD-10-CM
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
## 2024-05-29 ENCOUNTER — Encounter: Payer: Self-pay | Admitting: Neurology

## 2024-06-07 ENCOUNTER — Ambulatory Visit (INDEPENDENT_AMBULATORY_CARE_PROVIDER_SITE_OTHER): Admitting: Licensed Clinical Social Worker

## 2024-06-07 DIAGNOSIS — F32A Depression, unspecified: Secondary | ICD-10-CM | POA: Diagnosis not present

## 2024-06-07 DIAGNOSIS — F419 Anxiety disorder, unspecified: Secondary | ICD-10-CM

## 2024-06-07 NOTE — Progress Notes (Unsigned)
 [facility] Counselor/Therapist Progress Note  Patient ID: Melinda Parsons, MRN: [id number]    Date: 06/07/24  Time Spent: 1102  am - 1201 am : 59 Minutes  Treatment Type: Individual Therapy.  Reported Symptoms: Clinician met with Melinda via video. Patient participated from her home and Clinician participated from her home office. Patient was advised of risk and limitations. Patient consented for treatment.  Patient reports that over a course of years her daughter has been depressed and threatened to commit suicide. Spouse has had 3 mini strokes. Patient reports that her job was in limbo and therefore took on a new job with stress. Patients spouse most recently threatened to commit suicide and is currently in a day program. Marriage is unfulfilling due to patient being the primary bread winner.    Mental Status Exam: Appearance:  Casual     Behavior: Appropriate  Motor: Normal  Speech/Language:  Clear and Coherent  Affect: Appropriate  Mood: normal  Thought process: normal  Thought content:   WNL  Sensory/Perceptual disturbances:   WNL  Orientation: oriented to person, place, time/date, situation, day of week, month of year, and year  Attention: Good  Concentration: Good  Memory: WNL  Fund of knowledge:  Good  Insight:   Good  Judgment:  Good  Impulse Control: Good   Risk Assessment: Danger to Self:  No Self-injurious Behavior: No Danger to Others: No Duty to Warn:no Physical Aggression / Violence:No  Access to Firearms a concern: No  Gang Involvement:No   Subjective:   Melinda Parsons participated from home, via video. Melinda Parsons was aware of risk and limitations, and consented to treatment. Therapist participated from home office. We met online due to patient request.  Melinda Parsons presented for her session reporting that not much has changes since our last meeting. Nayali reports that she and her husband had a conversation about her feelings. She reports that it  didn't go well. She reports that he was offended and she recognized that she likely didn't deliver the message well. She reports that later she apologized and they were able to have a better conversation. Melinda Parsons reports that her husband is still in the day program and will be going back to work soon. She reports that she is concerned about him going back due to the stress and pressure he is under from his management. Melinda Parsons states that he has mentioned going back and completing his degree. She reports that she is encouraging him to do so. Melinda Parsons states that this idea has provided her with hope that things could improve.  Clinician provided support and encouragement via verbal interaction and verbal feedback. Clinician processed with patient her feelings about her being the one who carries the weight of her family. Although patient is overwhelmed she appeared to be less stressed and overwhelmed than in the initial meeting. Clinician and patient processed the positive of re visiting the conversation with her spouse after it didn't go the way she hoped. We discussed that giving time to let things calm down and then re visiting the topic was a good way to not ignore the situation but address it in a healthy manner.  Melinda Parsons was actively involved in session and was transparent in her verbal interaction during the session. Melinda Parsons will continue to utilize coping skills to assist her in meeting her treatment goals. Patient will also continue to make self care a priority. Melinda Parsons will continue to engage in bi weekly therapy and treatment planning will be reviewed by 05/24/2025.  Interventions: Cognitive Behavioral Therapy, Motivational Interviewing and psycho education. Clinician conducted session via vide from Clinicians home office. Reviewed events since last session. Assessed patient's mood since last session and current mood. Clinician reviewed diagnoses and treatment recommendations. Provided psycho education  related to diagnoses and treatment.     Diagnosis: Anxiety and Depression   Damien Junk MSW, LCSW/DATE 06/07/2024   Individualized Treatment Plan Strengths: Melinda Parsons is very driven and cares for her family.  Supports: Melinda Parsons reports a lack of social support in her life.    Goal/Needs for Treatment:  In order of importance to patient 1) I want to find joy in life again without feeling guilty. 2) I want to have conversations with my husband about his health and how it is affecting our marriage.    Client Statement of Needs: Patient is requesting both virtual and face to face sessions based on her schedule.     Treatment Level:Moderate-Bi weekly  Symptoms:Depression, Anxiety, excessive worry, loss of interest, feeling overwhelmed, exhausted mentally and physically.  Client Treatment Preferences:Patient is requesting both virtual and face to face sessions based on her schedule. CBT, DBT, Motivational Interviewing    Healthcare consumer's goal for treatment:   Therapist, Damien Junk MSW, LCSW will support the patient's ability to achieve the goals identified. Cognitive Behavioral Therapy, Assertive Communication/Conflict Resolution Training, Relaxation Training, ACT, Humanistic and other evidenced-based practices will be used to promote progress towards healthy functioning.    Healthcare consumer will: Actively participate in therapy, working towards healthy functioning.     *Justification for Continuation/Discontinuation of Goal: R=Revised, O=Ongoing, A=Achieved, D=Discontinued   Goal 1) I want to find joy in life again without feeling guilty. Baseline date 05/24/2024: Progress towards goal Ongoing; How Often - Daily Target Date Goal Was reviewed Status Code Progress towards goal/Likert rating  05/24/2025   O Ongoing                      Melinda Parsons will utilize CBT as a therapeutic approach to can help identify and challenge negative thought patterns that contribute to guilt and  hinder joy. It will involve cognitive restructuring to reframe negative thoughts, behavioral activation by engaging in joyful activities, and incorporating mindfulness. Practice gratitude, engaging in activities that bring you joy, embracing mindfulness, building resilience, maintaining a healthy lifestyle, setting goals, and connecting with others.    Goal 2) I want to have conversations with my husband about his health and how it is affecting our marriage. Baseline date 05/24/2024: Progress towards goal Ongoing; How Often - Daily Target Date Goal Was reviewed Status Code Progress towards goal  05/24/2025   O Ongoing                      Prepare yourself: Before the conversation, take time to center yourself and clarify your thoughts and intentions.  Have a goal: Know what you want to achieve from the conversation.  Choose the right time and place: Select a setting where you can both focus and feel comfortable.  Be open-minded: Approach the conversation with a willingness to listen and understand.  Be mindful of your emotions: Acknowledge your own feelings and be aware of how they might be influencing the conversation, according to the [place]  at [place].  During the Conversation: Use I statements: Express your feelings and perspective without placing blame. For example, instead of saying You always interrupt me, try I feel unheard when I'm interrupted.  Listen actively: Pay attention to  what the other person is saying, ask clarifying questions, and summarize their points to ensure understanding.  Empathize: Try to understand the other person's perspective and acknowledge their feelings.  Stay focused: Stick to the main topic and avoid getting sidetracked.  Be respectful: Even if you disagree, treat the other person with respect and avoid personal attacks.  Be willing to compromise: Recognize that you may not agree on everything and be open to finding a solution that works  for both of you.  Know when to take a break: If the conversation becomes too heated or unproductive, it's okay to pause and revisit it later.    This plan has been reviewed and created by the following participants:  This plan will be reviewed at least every 12 months. Date Behavioral Health Clinician Date Guardian/Patient   05/24/2024             Damien Junk MSW, LCSW 05/24/2024 Verbal Consent Provided                                   Damien Junk MSW, LCSW/DATE 05/24/2024

## 2024-06-17 ENCOUNTER — Encounter: Payer: Self-pay | Admitting: Family Medicine

## 2024-06-17 DIAGNOSIS — G609 Hereditary and idiopathic neuropathy, unspecified: Secondary | ICD-10-CM

## 2024-06-17 DIAGNOSIS — G43109 Migraine with aura, not intractable, without status migrainosus: Secondary | ICD-10-CM

## 2024-06-17 DIAGNOSIS — R200 Anesthesia of skin: Secondary | ICD-10-CM

## 2024-06-17 DIAGNOSIS — M5412 Radiculopathy, cervical region: Secondary | ICD-10-CM

## 2024-06-17 DIAGNOSIS — R748 Abnormal levels of other serum enzymes: Secondary | ICD-10-CM

## 2024-06-17 DIAGNOSIS — R202 Paresthesia of skin: Secondary | ICD-10-CM

## 2024-06-21 ENCOUNTER — Ambulatory Visit (INDEPENDENT_AMBULATORY_CARE_PROVIDER_SITE_OTHER): Admitting: Licensed Clinical Social Worker

## 2024-06-21 DIAGNOSIS — F32A Depression, unspecified: Secondary | ICD-10-CM | POA: Diagnosis not present

## 2024-06-21 DIAGNOSIS — F419 Anxiety disorder, unspecified: Secondary | ICD-10-CM | POA: Diagnosis not present
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
## 2024-07-04 ENCOUNTER — Telehealth (INDEPENDENT_AMBULATORY_CARE_PROVIDER_SITE_OTHER): Admitting: Family Medicine

## 2024-07-04 ENCOUNTER — Encounter: Payer: Self-pay | Admitting: Family Medicine

## 2024-07-04 DIAGNOSIS — R200 Anesthesia of skin: Secondary | ICD-10-CM | POA: Diagnosis not present

## 2024-07-04 DIAGNOSIS — R29898 Other symptoms and signs involving the musculoskeletal system: Secondary | ICD-10-CM | POA: Diagnosis not present

## 2024-07-04 DIAGNOSIS — R202 Paresthesia of skin: Secondary | ICD-10-CM

## 2024-07-04 NOTE — Progress Notes (Signed)
 Virtual Visit via Video Note  I connected with Melinda Parsons on 07/04/24 at 11:30 AM EDT by a video enabled telemedicine application and verified that I am speaking with the correct person using two identifiers.   I discussed the limitations of evaluation and management by telemedicine and the availability of in person appointments. The patient expressed understanding and agreed to proceed.  Patient location: at home Provider location: in office  Subjective:    CC:   Chief Complaint  Patient presents with   Medical Management of Chronic Issues    Discuss Work Accomodation form for completion    HPI: Has been having numbness and tingling in both lower extremities with intermittent weakness that flares. Recently established care with [facility]. Was previously followed by Dr. Venetia Potters. 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 She would like to have forms for work to be completed so that she can work from home when she has a flare.    Past medical history, Surgical history, Family history not pertinant except as noted below, Social history, Allergies, and medications have been entered into the medical record, reviewed, and corrections made.    Objective:    General: Speaking clearly in complete sentences without any shortness of breath.  Alert and oriented x3.  Normal judgment. No apparent acute distress.    Impression and Recommendations:    Problem List Items Addressed This Visit   None Visit Diagnoses       Paresthesias    -  Primary     Numbness of right foot         Numbness of left foot         Weakness of both lower extremities          With paresthesias numbness and weakness of the lower extremities that hits randomly she really is not safe to drive on those days but she is perfectly capable of sitting at her desk at home and being able  to work.  Will complete forms and paperwork requesting combination that she would be allowed to not go into the office on days where she is experiencing numbness and weakness.  It would also be difficult for her to travel across the parking lot or even climb stairs.  She says on days when it happens even within her own home she has difficulty maneuvering stairs.  She is able to perform all of her other work duties at this point in time.  Paperwork completed and placed in Tonya B basket to be faxed over this afternoon.  If she needs anything else from us  then please let us  know.   No orders of the defined types were placed in this encounter.   No orders of the defined types were placed in this encounter.    I discussed the assessment and treatment plan with the patient. The patient was provided an opportunity to ask questions and all were answered. The patient agreed with the plan and demonstrated an understanding of the instructions.   The patient was advised to call back or seek an in-person evaluation if the symptoms worsen or if the condition fails to improve as anticipated.  I spent 20 minutes on the day of the encounter to include pre-visit record review, face-to-face time with the patient and post visit ordering of test.   Dorothyann  Alvan, MD

## 2024-07-19 ENCOUNTER — Ambulatory Visit (INDEPENDENT_AMBULATORY_CARE_PROVIDER_SITE_OTHER): Admitting: Licensed Clinical Social Worker

## 2024-07-19 DIAGNOSIS — F32A Depression, unspecified: Secondary | ICD-10-CM | POA: Diagnosis not present

## 2024-07-19 DIAGNOSIS — F419 Anxiety disorder, unspecified: Secondary | ICD-10-CM
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
## 2024-07-31 NOTE — Addendum Note (Signed)
 Addended by: Lillyauna Jenkinson D on: 07/31/2024 08:45 PM   Modules accepted: Orders

## 2024-08-13 ENCOUNTER — Encounter: Payer: Self-pay | Admitting: Sports Medicine

## 2024-08-16 NOTE — Addendum Note (Signed)
 Addended by: Kaden Dunkel D on: 08/16/2024 05:23 PM   Modules accepted: Orders

## 2024-08-19 ENCOUNTER — Ambulatory Visit: Payer: Self-pay | Admitting: Family Medicine

## 2024-08-19 LAB — NASH FIBROSURE(R) PLUS

## 2024-08-19 NOTE — Progress Notes (Signed)
 Labs actually look okay so far.  And in fact that ALT that has been elevated a couple times is actually back into the normal range.  Iron and ferritin look great.  Still awaiting the FibroSure results.

## 2024-08-20 LAB — HEPATIC FUNCTION PANEL
ALT: 18 IU/L (ref 0–32)
AST: 22 IU/L (ref 0–40)
Albumin: 4.8 g/dL (ref 3.8–4.9)
Alkaline Phosphatase: 85 IU/L (ref 44–121)
Bilirubin Total: 0.7 mg/dL (ref 0.0–1.2)
Bilirubin, Direct: 0.21 mg/dL (ref 0.00–0.40)
Total Protein: 7.9 g/dL (ref 6.0–8.5)

## 2024-08-20 LAB — NASH FIBROSURE(R) PLUS
ALPHA 2-MACROGLOBULINS, QN: 245 mg/dL (ref 110–276)
ALT (SGPT) P5P: 21 IU/L (ref 0–40)
AST (SGOT) P5P: 23 IU/L (ref 0–40)
Apolipoprotein A-1: 216 mg/dL — AB (ref 116–209)
Bilirubin, Total: 0.5 mg/dL (ref 0.0–1.2)
Cholesterol, Total: 212 mg/dL — AB (ref 100–199)
Fibrosis Score: 0.12 (ref 0.00–0.21)
GGT: 25 IU/L (ref 0–60)
Glucose: 90 mg/dL (ref 70–99)
Haptoglobin: 116 mg/dL (ref 33–346)
NASH Score: 0 (ref 0.00–0.25)
Steatosis Score: 0.21 (ref 0.00–0.40)
Triglycerides: 123 mg/dL (ref 0–149)

## 2024-08-20 LAB — MITOCHONDRIAL ANTIBODIES: Mitochondrial Ab: <20 U (ref 0.0–20.0)

## 2024-08-20 LAB — CBC
Hematocrit: 44.6 % (ref 34.0–46.6)
Hemoglobin: 14.7 g/dL (ref 11.1–15.9)
MCH: 31.7 pg (ref 26.6–33.0)
MCHC: 33 g/dL (ref 31.5–35.7)
MCV: 96 fL (ref 79–97)
Platelets: 208 x10E3/uL (ref 150–450)
RBC: 4.63 x10E6/uL (ref 3.77–5.28)
RDW: 11.9 % (ref 11.7–15.4)
WBC: 7.7 x10E3/uL (ref 3.4–10.8)

## 2024-08-20 LAB — IRON,TIBC AND FERRITIN PANEL
Ferritin: 118 ng/mL (ref 15–150)
Iron Saturation: 40 % (ref 15–55)
Iron: 146 ug/dL (ref 27–159)
Total Iron Binding Capacity: 368 ug/dL (ref 250–450)
UIBC: 222 ug/dL (ref 131–425)

## 2024-08-20 LAB — IGA: IgA/Immunoglobulin A, Serum: 287 mg/dL (ref 87–352)

## 2024-08-20 LAB — ANA: Anti Nuclear Antibody (ANA): NEGATIVE

## 2024-08-20 LAB — ALPHA-1-ANTITRYPSIN: A-1 Antitrypsin: 146 mg/dL (ref 101–187)

## 2024-08-20 LAB — TISSUE TRANSGLUTAMINASE ABS,IGG,IGA
Tissue Transglut Ab: 3 U/mL (ref 0–5)
Transglutaminase IgA: <2 U/mL (ref 0–3)

## 2024-08-20 LAB — PROTIME-INR
INR: 1.1 (ref 0.9–1.2)
Prothrombin Time: 11.4 s (ref 9.1–12.0)

## 2024-08-20 LAB — ANTI-SMOOTH MUSCLE ANTIBODY, IGG: Smooth Muscle Ab: 11 U (ref 0–19)

## 2024-08-23 NOTE — Progress Notes (Signed)
 Hi Melinda Parsons, fibrosis score finally came back.  They rated it in F0 which is great meaning no indication of fibrosis or excess fatty liver.  Triglycerides are up a little bit compared in the past so would encourage you to continue to work on healthy diet and regular exercise as this can be a risk factor for fatty liver over time.  Also your apolipoprotein A 1 is above normal.  This is actually considered a good thing and is considered beneficial for heart health so that is fantastic.

## 2024-09-03 ENCOUNTER — Encounter: Payer: Self-pay | Admitting: Family Medicine

## 2024-09-03 DIAGNOSIS — M5030 Other cervical disc degeneration, unspecified cervical region: Secondary | ICD-10-CM

## 2024-09-04 MED ORDER — GABAPENTIN 600 MG PO TABS: 600.0000 mg | ORAL_TABLET | Freq: Every day | ORAL | 3 refills | Status: AC

## 2024-09-13 ENCOUNTER — Ambulatory Visit: Admitting: Licensed Clinical Social Worker

## 2024-10-04 ENCOUNTER — Ambulatory Visit: Payer: Self-pay | Admitting: Licensed Clinical Social Worker
